# Patient Record
Sex: Female | Born: 1935
Health system: Southern US, Community
[De-identification: ages and names within clinical notes are randomized; demographics above are authoritative.]

## PROBLEM LIST (undated history)

## (undated) DIAGNOSIS — E785 Hyperlipidemia, unspecified: Secondary | ICD-10-CM

## (undated) DIAGNOSIS — I4891 Unspecified atrial fibrillation: Secondary | ICD-10-CM

## (undated) DIAGNOSIS — I429 Cardiomyopathy, unspecified: Secondary | ICD-10-CM

## (undated) DIAGNOSIS — I639 Cerebral infarction, unspecified: Secondary | ICD-10-CM

## (undated) DIAGNOSIS — C801 Malignant (primary) neoplasm, unspecified: Secondary | ICD-10-CM

## (undated) DIAGNOSIS — C50919 Malignant neoplasm of unspecified site of unspecified female breast: Secondary | ICD-10-CM

## (undated) DIAGNOSIS — I4821 Permanent atrial fibrillation: Secondary | ICD-10-CM

## (undated) DIAGNOSIS — D649 Anemia, unspecified: Secondary | ICD-10-CM

## (undated) DIAGNOSIS — I499 Cardiac arrhythmia, unspecified: Secondary | ICD-10-CM

## (undated) DIAGNOSIS — I471 Supraventricular tachycardia, unspecified: Secondary | ICD-10-CM

## (undated) DIAGNOSIS — I509 Heart failure, unspecified: Secondary | ICD-10-CM

## (undated) DIAGNOSIS — Z5189 Encounter for other specified aftercare: Secondary | ICD-10-CM

## (undated) DIAGNOSIS — I10 Essential (primary) hypertension: Secondary | ICD-10-CM

## (undated) DIAGNOSIS — H269 Unspecified cataract: Secondary | ICD-10-CM

## (undated) HISTORY — DX: Encounter for other specified aftercare: Z51.89

## (undated) HISTORY — DX: Supraventricular tachycardia: I47.1

## (undated) HISTORY — DX: Hyperlipidemia, unspecified: E78.5

## (undated) HISTORY — DX: Cardiac arrhythmia, unspecified: I49.9

## (undated) HISTORY — DX: Unspecified atrial fibrillation: I48.91

## (undated) HISTORY — DX: Unspecified cataract: H26.9

## (undated) HISTORY — DX: Supraventricular tachycardia, unspecified: I47.10

## (undated) HISTORY — PX: TONSILLECTOMY: SUR1361

## (undated) HISTORY — DX: Essential (primary) hypertension: I10

## (undated) HISTORY — DX: Anemia, unspecified: D64.9

## (undated) HISTORY — PX: WRIST SURGERY: SHX841

## (undated) HISTORY — DX: Cardiomyopathy, unspecified: I42.9

## (undated) HISTORY — DX: Heart failure, unspecified: I50.9

## (undated) HISTORY — DX: Permanent atrial fibrillation: I48.21

## (undated) HISTORY — PX: MASTECTOMY: SHX3

---

## 2016-07-25 ENCOUNTER — Encounter: Payer: Self-pay | Admitting: Cardiology

## 2016-07-26 ENCOUNTER — Ambulatory Visit: Payer: Self-pay | Admitting: Cardiology

## 2016-08-01 ENCOUNTER — Encounter: Payer: Self-pay | Admitting: Cardiology

## 2016-08-01 ENCOUNTER — Ambulatory Visit (INDEPENDENT_AMBULATORY_CARE_PROVIDER_SITE_OTHER): Payer: Medicare HMO | Admitting: Cardiology

## 2016-08-01 VITALS — BP 138/88 | HR 123 | Ht 62.0 in | Wt 138.0 lb

## 2016-08-01 DIAGNOSIS — I4821 Permanent atrial fibrillation: Secondary | ICD-10-CM

## 2016-08-01 DIAGNOSIS — Z7901 Long term (current) use of anticoagulants: Secondary | ICD-10-CM | POA: Diagnosis not present

## 2016-08-01 DIAGNOSIS — I482 Chronic atrial fibrillation: Secondary | ICD-10-CM

## 2016-08-01 DIAGNOSIS — I4891 Unspecified atrial fibrillation: Secondary | ICD-10-CM | POA: Diagnosis not present

## 2016-08-01 DIAGNOSIS — I420 Dilated cardiomyopathy: Secondary | ICD-10-CM

## 2016-08-01 MED ORDER — METOPROLOL SUCCINATE ER 100 MG PO TB24
100.0000 mg | ORAL_TABLET | Freq: Every day | ORAL | 6 refills | Status: DC
Start: 2016-08-01 — End: 2016-08-01

## 2016-08-01 MED ORDER — LOSARTAN POTASSIUM 50 MG PO TABS: 50.0000 mg | ORAL_TABLET | Freq: Every day | ORAL | 1 refills | Status: DC

## 2016-08-01 MED ORDER — DILTIAZEM HCL ER COATED BEADS 120 MG PO CP24: 120.0000 mg | ORAL_CAPSULE | Freq: Every day | ORAL | 6 refills | Status: DC

## 2016-08-01 MED ORDER — BUMETANIDE 1 MG PO TABS: 1.0000 mg | ORAL_TABLET | Freq: Every day | ORAL | 0 refills | Status: DC

## 2016-08-01 MED ORDER — APIXABAN 5 MG PO TABS: 5.0000 mg | ORAL_TABLET | Freq: Two times a day (BID) | ORAL | 1 refills | Status: DC

## 2016-08-01 MED ORDER — LOSARTAN POTASSIUM 50 MG PO TABS: 50.0000 mg | ORAL_TABLET | Freq: Every day | ORAL | 6 refills | Status: DC

## 2016-08-01 MED ORDER — DILTIAZEM HCL ER COATED BEADS 120 MG PO CP24: 120.0000 mg | ORAL_CAPSULE | Freq: Every day | ORAL | 1 refills | Status: DC

## 2016-08-01 MED ORDER — APIXABAN 5 MG PO TABS: 5.0000 mg | ORAL_TABLET | Freq: Two times a day (BID) | ORAL | 6 refills | Status: DC

## 2016-08-01 MED ORDER — METOPROLOL SUCCINATE ER 100 MG PO TB24: 100.0000 mg | ORAL_TABLET | Freq: Every day | ORAL | 1 refills | Status: DC

## 2016-08-01 NOTE — Patient Instructions (Signed)
Medication Instructions:  Start Eliquis 5mg  two times a day  Start metoprolol succinate (Toprol XL) 100 mg daily  Start losartan 50mg  daily.  Start Diltizaem CD 120mg  daily.  Use Bumex 1mg  daily as needed (this is the fluid pill)  Labwork: None   Testing/Procedures: None   Follow-Up:  Your physician recommends that you schedule a follow-up appointment in: 3 weeks with Nell Range, PA You will probably need lab and EKG at that time.        If you need a refill on your cardiac medications before your next appointment, please call your pharmacy.

## 2016-08-01 NOTE — Progress Notes (Signed)
Cardiology Office Note    Date:  08/01/2016   ID:  Carol [name], DOB [birth year], MRN [id number]  PCP:  No primary care provider on file.  Cardiologist:   Candee Furbish, MD     History of Present Illness:  Carol [name] is a 82 y.o. female with AFIB RVR here to establish care after moving from [facility].   Previously in April her ejection fraction was 40% but this improved to 50% a month later after improved rate control. She switched insurances and now has Medicare part D. Previously the Eliquis was not affordable for her at $400 a month.  She admits that she's been off of some of the medications since moving to [place]. She is now living independently in an apartment. She is here with her friend today who knows Carol [name], [place].  Had a small stroke she was told at the time treated for breast cancer. Saw neuro. 10 years ago.   No DM,   +HTN  No PVD  She denies any prior myocardial infarctions. She seems younger than stated age.  Past Medical History:  Diagnosis Date  . A-fib ([place])   . Arrhythmia   . Dyslipidemia   . Hyperlipidemia   . Hypertension   . Paroxysmal supraventricular tachycardia (HCC)     No past surgical history on file.  Current Medications: Outpatient Medications Prior to Visit  Medication Sig Dispense Refill  . acetaminophen (TYLENOL) 325 MG tablet Take 650 mg by mouth every 4 (four) hours as needed for mild pain.    Marland Kitchen apixaban (ELIQUIS) 5 MG TABS tablet Take 5 mg by mouth 2 (two) times daily.    . bumetanide (BUMEX) 1 MG tablet Take 1 mg by mouth daily.    Marland Kitchen diltiazem (CARDIZEM CD) 120 MG 24 hr capsule Take 120 mg by mouth daily.    Marland Kitchen losartan (COZAAR) 50 MG tablet Take 50 mg by mouth daily.    . metoprolol succinate (TOPROL-XL) 100 MG 24 hr tablet Take 100 mg by mouth daily. Take with or immediately following a meal EVERY 2 DAYS.     No facility-administered medications prior to visit.      Allergies:   Patient has no known  allergies.   Social History   Social History  . Marital status: Widowed    Spouse name: N/A  . Number of children: N/A  . Years of education: N/A   Social History Main Topics  . Smoking status: Never Smoker  . Smokeless tobacco: Never Used  . Alcohol use No  . Drug use: No  . Sexual activity: Not Asked   Other Topics Concern  . None   Social History Narrative  . None     Family History:  The patient's Mother died early in her life. No CAD. Had epilespy.    ROS:   Please see the history of present illness.   No bleeding, no orthopnea, no PND, no syncope. ROS All other systems reviewed and are negative.   PHYSICAL EXAM:   VS:  BP 138/88   Pulse (!) 123   Ht 5\' 2"  (1.575 m)   Wt 138 lb (62.6 kg)   LMP  (LMP Unknown)   BMI 25.24 kg/m    GEN: Well nourished, well developed, in no acute distress  HEENT: normal  Neck: no JVD, carotid bruits, or masses Cardiac: Irreg iireg;Tachycardic no murmurs, rubs, or gallops,no edema  Respiratory:  clear to auscultation bilaterally, normal work of breathing GI: soft,  nontender, nondistended, + BS MS: no deformity or atrophy  Skin: warm and dry, no rash Neuro:  Alert and Oriented x 3, Strength and sensation are intact Psych: euthymic mood, full affect  Wt Readings from Last 3 Encounters:  08/01/16 138 lb (62.6 kg)      Studies/Labs Reviewed:   EKG:  EKG is ordered today.  The ekg ordered today demonstrates 08/01/16-atrial fibrillation heart rate 122 bpm, rapid ventricular response, left axis deviation.  Recent Labs: No results found for requested labs within last 8760 hours.   Lipid Panel No results found for: CHOL, TRIG, HDL, CHOLHDL, VLDL, LDLCALC, LDLDIRECT  Additional studies/ records that were reviewed today include:  Prior office notes reviewed, lab work reviewed, EKG reviewed    ASSESSMENT:    1. Atrial fibrillation, unspecified type ([place])   2. Permanent atrial fibrillation ([place])   3. Chronic anticoagulation    4. Congestive dilated cardiomyopathy (HCC)      PLAN:  In order of problems listed above:  Permanent atrial fibrillation  - Trying to improve rate control.  - Currently on metoprolol succinate 100 mg and Cardizem CD 120 mg once a day. She may not a been taking these recently. We will get her back on these medications and check her heart rate.  Chronic anticoagulation  - Eliquis 5 mg twice a day  - Chads-Vasc score- (2-age, 1-HTN, 2-CVA, 1 CHF) - 6  - Expressed the importance to her taking this medication for stroke prevention especially in the light of her prior mini stroke approximate 10 years ago. Went over risk score is above.  Cardiomyopathy   - Ejection fraction 50% up from 40% in May 2017. Asymptomatic, rate control. Prior stress test March 2015. Possibly from tachycardia. Prior stress test was reassuring. This was performed in 10/2013.  - Decided to use Bumex on an as-needed basis, 1 mg. She has not had any problems with fluid overload.  Essential hypertension  - She was changed from lisinopril 10 mg daily to losartan 50 mg daily. May have had a mild cold with losartan perhaps.  We will check blood work at next visit. Prior creatinine 0.7. Hemoglobin 14.9, ALT 22, TSH 2.1. Prior EKGs reviewed with A. fib RVR and nonspecific ST-T wave changes.    Medication Adjustments/Labs and Tests Ordered: Current medicines are reviewed at length with the patient today.  Concerns regarding medicines are outlined above.  Medication changes, Labs and Tests ordered today are listed in the Patient Instructions below. Patient Instructions  Medication Instructions:  Start Eliquis 5mg  two times a day  Start metoprolol succinate (Toprol XL) 100 mg daily  Start losartan 50mg  daily.  Start Diltizaem CD 120mg  daily.  Use Bumex 1mg  daily as needed (this is the fluid pill)  Labwork: None   Testing/Procedures: None   Follow-Up:  Your physician recommends that you schedule a follow-up  appointment in: 3 weeks with Carol Range, PA You will probably need lab and EKG at that time.        If you need a refill on your cardiac medications before your next appointment, please call your pharmacy.      Signed, Candee Furbish, MD  08/01/2016 5:09 PM    [facility] [address] [place], [place], [place]  [address] Phone: [phone]; Fax: [phone]

## 2016-08-10 ENCOUNTER — Ambulatory Visit: Payer: Medicare HMO | Admitting: Family Medicine

## 2016-09-01 ENCOUNTER — Encounter: Payer: Self-pay | Admitting: Family Medicine

## 2016-09-01 ENCOUNTER — Telehealth: Payer: Self-pay | Admitting: Family Medicine

## 2016-09-01 DIAGNOSIS — E785 Hyperlipidemia, unspecified: Secondary | ICD-10-CM | POA: Insufficient documentation

## 2016-09-01 DIAGNOSIS — I10 Essential (primary) hypertension: Secondary | ICD-10-CM | POA: Insufficient documentation

## 2016-09-01 DIAGNOSIS — I471 Supraventricular tachycardia: Secondary | ICD-10-CM | POA: Insufficient documentation

## 2016-09-01 NOTE — Telephone Encounter (Signed)
Received records from [facility] and [facility]- will abstract and scan pertinent

## 2016-09-05 ENCOUNTER — Telehealth: Payer: Self-pay | Admitting: Cardiology

## 2016-09-05 ENCOUNTER — Ambulatory Visit: Payer: Medicare HMO | Admitting: Cardiology

## 2016-09-05 NOTE — Telephone Encounter (Signed)
New Message  Pts daughter voiced pt is suppose to have lab and not sure if it's fasting.  There's also no order in system for pt.  Please f/u with pts daughter.

## 2016-09-05 NOTE — Telephone Encounter (Signed)
Alyson Reedy, the patient's sister-in-law (DPR on file) has requested that the patient's appointment be rescheduled for an early morning appointment. She states that when the patient was seen by Dr. Marlou Porch that they were told that she was to follow up with Nell Range, PA-C in 3 weeks, and that she would need labs and an EKG. She wanted to have the appointment early in the morning so that the patient can fast for labs. Appointment was rescheduled for 09/12/16 at 9:00am with Nell Range, PA-C. She verbalized understanding and appreciated the call.

## 2016-09-07 ENCOUNTER — Ambulatory Visit: Payer: Medicare HMO | Admitting: Physician Assistant

## 2016-09-08 NOTE — Progress Notes (Addendum)
Cardiology Office Note    Date:  09/12/2016   ID:  Gagandeep Dekle, DOB December 05, 1927, MRN [id number]  PCP:  Lamar Blinks, MD  Cardiologist: Dr. Marlou Porch  CC: afib follow up   History of Present Illness:  Carol [name] is a 83 y.o. female with a history of chronic atrial fibrillation, history of tachycardia induced CM, HTN, breast cancer, TIA and HLD who presents to clinic for follow up.   Previously in 11/2015 her ejection fraction was 40% but this improved to 50% a month later after improved rate control. She switched insurances and now has Medicare part D. Previously the Eliquis was not affordable for her at $400 a month. She had a stress test 10/2013 which was low risk.   She was seen by Dr. Marlou Porch in the office on 08/01/16 for new patient evaluation since she had just moved from [place]. She was found to be in afib with RVR. She admitted to not taking all her home medications since th move to [facility]. She was restarted on home meds of Toprol Xl 100mg  daily and Cardizem 120mg  daily. Continued on Bumex PRN for s/s CHF.   Today she presents to clinic for follow up. No CP or SOB. No LE edema, orthopnea or PND. No dizziness or syncope. No blood in stool or urine. No palpitations.    Past Medical History:  Diagnosis Date  . A-fib ([place])   . Arrhythmia   . Dyslipidemia   . Hyperlipidemia   . Hypertension   . Paroxysmal supraventricular tachycardia (HCC)     No past surgical history on file.  Current Medications: Outpatient Medications Prior to Visit  Medication Sig Dispense Refill  . acetaminophen (TYLENOL) 325 MG tablet Take 650 mg by mouth every 4 (four) hours as needed for mild pain.    Marland Kitchen apixaban (ELIQUIS) 5 MG TABS tablet Take 1 tablet (5 mg total) by mouth 2 (two) times daily. 60 tablet 0  . diltiazem (CARDIZEM CD) 120 MG 24 hr capsule Take 1 capsule (120 mg total) by mouth daily. 90 capsule 1  . losartan (COZAAR) 50 MG tablet Take 1 tablet (50 mg total) by mouth daily. 90 tablet 1    . metoprolol succinate (TOPROL-XL) 100 MG 24 hr tablet Take 1 tablet (100 mg total) by mouth daily. Take with or immediately following a meal. 90 tablet 1  . bumetanide (BUMEX) 1 MG tablet Take 1 tablet (1 mg total) by mouth daily. As needed (Patient not taking: Reported on 09/12/2016) 30 tablet 0   No facility-administered medications prior to visit.      Allergies:   Patient has no known allergies.   Social History   Social History  . Marital status: Widowed    Spouse name: N/A  . Number of children: N/A  . Years of education: N/A   Social History Main Topics  . Smoking status: Never Smoker  . Smokeless tobacco: Never Used  . Alcohol use No  . Drug use: No  . Sexual activity: Not Asked   Other Topics Concern  . None   Social History Narrative  . None     Family History:  The patient's maternal aunt had a heart condition but she does not know what.    ROS:   Please see the history of present illness.    ROS All other systems reviewed and are negative.   PHYSICAL EXAM:   VS:  BP 120/68   Pulse 66   Ht 5\' 2"  (1.575 m)  Wt 142 lb 6.4 oz (64.6 kg)   LMP  (LMP Unknown)   BMI 26.05 kg/m    GEN: Well nourished, well developed, in no acute distress, appears younger than her stated age.  HEENT: normal  Neck: no JVD, carotid bruits, or masses Cardiac: irreg irreg; no murmurs, rubs, or gallops,no edema  Respiratory:  clear to auscultation bilaterally, normal work of breathing GI: soft, nontender, nondistended, + BS MS: no deformity or atrophy  Skin: warm and dry, no rash Neuro:  Alert and Oriented x 3, Strength and sensation are intact Psych: euthymic mood, full affect    Wt Readings from Last 3 Encounters:  09/12/16 142 lb 6.4 oz (64.6 kg)  08/01/16 138 lb (62.6 kg)      Studies/Labs Reviewed:   EKG:  EKG is ordered today.  The ekg ordered today demonstrates afib with CVR HR 66  Recent Labs: No results found for requested labs within last 8760 hours.    Lipid Panel No results found for: CHOL, TRIG, HDL, CHOLHDL, VLDL, LDLCALC, LDLDIRECT  Additional studies/ records that were reviewed today include:  ECHO 12/2015 EF 50%, mild LAE, mod RAE, mild AR/MR/TR, mild pulm HTN, indeterminate diastolic function (in afib) with elevated filling pressures   ASSESSMENT & PLAN:   Chronic atrial fibrillation: now HR well controlled on home medications. Continue toprol xl 100mg  daily and diltiazem 120mg  daily. Ran out of Eliquis 5mg  BID last night. (Age >80, creat 0.7, weight 64 kg). Samples given. CHADSVASC of at least 4 (HTN, age, f sex).  History of tachycardia induced CM: EF normalized by echo in 12/2015. No s/s volume overload. She has PRN bumex if she needs it.   HTN: BP well controlled today. Continue current regimen   HLD: will check lipids/LFTs today.    Medication Adjustments/Labs and Tests Ordered: Current medicines are reviewed at length with the patient today.  Concerns regarding medicines are outlined above.  Medication changes, Labs and Tests ordered today are listed in the Patient Instructions below. Patient Instructions  Medication Instructions:  Your physician recommends that you continue on your current medications as directed. Please refer to the Current Medication list given to you today.   Labwork: TODAY:  BMET, CBC, LIPID, LFT, & TSH  Testing/Procedures: None ordered  Follow-Up: Your physician wants you to follow-up in: [place] DR. Marlou Porch  You will receive a reminder letter in the mail two months in advance. If you don't receive a letter, please call our office to schedule the follow-up appointment.   Any Other Special Instructions Will Be Listed Below (If Applicable).     If you need a refill on your cardiac medications before your next appointment, please call your pharmacy.      Signed, Angelena Form, PA-C  09/12/2016 9:38 AM    [facility] [address], [place], [place]   [address] Phone: [phone]; Fax: [phone]

## 2016-09-11 ENCOUNTER — Other Ambulatory Visit: Payer: Self-pay | Admitting: *Deleted

## 2016-09-11 DIAGNOSIS — I4891 Unspecified atrial fibrillation: Secondary | ICD-10-CM

## 2016-09-11 MED ORDER — APIXABAN 5 MG PO TABS: 5.0000 mg | ORAL_TABLET | Freq: Two times a day (BID) | ORAL | 0 refills | Status: DC

## 2016-09-11 NOTE — Telephone Encounter (Signed)
Started on Eliquis 5mg  bid on Dec 5th 2017 Her age is 89 and weight 62.6kg no cbc or bmet on chart . Will need cbc and bmet with office visit [place]

## 2016-09-11 NOTE — Telephone Encounter (Signed)
Patients sister requested a short term supply until patients mail order arrives. She took her last tablet this AM.

## 2016-09-11 NOTE — Telephone Encounter (Signed)
Pt states she only has 1 tablet left of Eliquis so refill sent in to local pharmacy for 1 month until gets refill Is to see NP tomorrow and have requested CBC and BMET when seen tomorrow Pt instructed will send in 1 month supply of Eliquis and she states understanding

## 2016-09-12 ENCOUNTER — Ambulatory Visit (INDEPENDENT_AMBULATORY_CARE_PROVIDER_SITE_OTHER): Payer: Medicare HMO | Admitting: Physician Assistant

## 2016-09-12 ENCOUNTER — Encounter: Payer: Self-pay | Admitting: Physician Assistant

## 2016-09-12 VITALS — BP 120/68 | HR 66 | Ht 62.0 in | Wt 142.4 lb

## 2016-09-12 DIAGNOSIS — I482 Chronic atrial fibrillation: Secondary | ICD-10-CM | POA: Diagnosis not present

## 2016-09-12 DIAGNOSIS — E785 Hyperlipidemia, unspecified: Secondary | ICD-10-CM | POA: Diagnosis not present

## 2016-09-12 DIAGNOSIS — I10 Essential (primary) hypertension: Secondary | ICD-10-CM | POA: Diagnosis not present

## 2016-09-12 DIAGNOSIS — I4821 Permanent atrial fibrillation: Secondary | ICD-10-CM

## 2016-09-12 NOTE — Patient Instructions (Addendum)
Medication Instructions:  Your physician recommends that you continue on your current medications as directed. Please refer to the Current Medication list given to you today.   Labwork: TODAY:  BMET, CBC, LIPID, LFT, & TSH  Testing/Procedures: None ordered  Follow-Up: Your physician wants you to follow-up in: [place] DR. Marlou Porch  You will receive a reminder letter in the mail two months in advance. If you don't receive a letter, please call our office to schedule the follow-up appointment.   Any Other Special Instructions Will Be Listed Below (If Applicable).     If you need a refill on your cardiac medications before your next appointment, please call your pharmacy.

## 2016-09-13 LAB — BASIC METABOLIC PANEL
BUN / CREAT RATIO: 18 (ref 12–28)
BUN: 14 mg/dL (ref 8–27)
CALCIUM: 10 mg/dL (ref 8.7–10.3)
CHLORIDE: 101 mmol/L (ref 96–106)
CO2: 24 mmol/L (ref 18–29)
Creatinine, Ser: 0.8 mg/dL (ref 0.57–1.00)
GFR, EST AFRICAN AMERICAN: 77 mL/min/{1.73_m2} (ref >59)
GFR, EST NON AFRICAN AMERICAN: 67 mL/min/{1.73_m2} (ref >59)
Glucose: 90 mg/dL (ref 65–99)
Potassium: 4.4 mmol/L (ref 3.5–5.2)
SODIUM: 141 mmol/L (ref 134–144)

## 2016-09-13 LAB — HEPATIC FUNCTION PANEL
ALBUMIN: 4.1 g/dL (ref 3.5–4.7)
ALK PHOS: 72 IU/L (ref 39–117)
ALT: 19 IU/L (ref 0–32)
AST: 28 IU/L (ref 0–40)
BILIRUBIN TOTAL: 0.5 mg/dL (ref 0.0–1.2)
Bilirubin, Direct: 0.18 mg/dL (ref 0.00–0.40)
TOTAL PROTEIN: 6.9 g/dL (ref 6.0–8.5)

## 2016-09-13 LAB — LIPID PANEL
CHOL/HDL RATIO: 2.2 ratio (ref 0.0–4.4)
Cholesterol, Total: 182 mg/dL (ref 100–199)
HDL: 81 mg/dL (ref >39)
LDL Calculated: 87 mg/dL (ref 0–99)
Triglycerides: 72 mg/dL (ref 0–149)
VLDL Cholesterol Cal: 14 mg/dL (ref 5–40)

## 2016-09-13 LAB — CBC
HEMATOCRIT: 40.3 % (ref 34.0–46.6)
Hemoglobin: 13.6 g/dL (ref 11.1–15.9)
MCH: 30.4 pg (ref 26.6–33.0)
MCHC: 33.7 g/dL (ref 31.5–35.7)
MCV: 90 fL (ref 79–97)
PLATELETS: 241 10*3/uL (ref 150–379)
RBC: 4.48 x10E6/uL (ref 3.77–5.28)
RDW: 14.7 % (ref 12.3–15.4)
WBC: 5.6 10*3/uL (ref 3.4–10.8)

## 2016-09-13 LAB — TSH: TSH: 1.99 u[IU]/mL (ref 0.450–4.500)

## 2016-09-15 ENCOUNTER — Telehealth: Payer: Self-pay | Admitting: *Deleted

## 2016-09-15 NOTE — Telephone Encounter (Signed)
Spoke with pt and informed that her cbc and SrCr done on Jan 16th  are within normal range for her to be on Eliquis 5mg  bid and to continue on this dosage . Also informed that she has 1 month refill sent to local pharmacy and she states she will let us know when to sent refill to mail order pharmacy

## 2016-09-17 ENCOUNTER — Other Ambulatory Visit: Payer: Self-pay | Admitting: Cardiology

## 2016-09-17 DIAGNOSIS — I4891 Unspecified atrial fibrillation: Secondary | ICD-10-CM

## 2016-09-25 ENCOUNTER — Ambulatory Visit: Payer: Medicare HMO | Admitting: Family Medicine

## 2016-10-03 ENCOUNTER — Emergency Department (HOSPITAL_BASED_OUTPATIENT_CLINIC_OR_DEPARTMENT_OTHER): Payer: Medicare HMO

## 2016-10-03 ENCOUNTER — Emergency Department (HOSPITAL_BASED_OUTPATIENT_CLINIC_OR_DEPARTMENT_OTHER)
Admission: EM | Admit: 2016-10-03 | Discharge: 2016-10-03 | Disposition: A | Discharge status: Home / Self Care | Payer: Medicare HMO | Attending: Emergency Medicine | Admitting: Emergency Medicine

## 2016-10-03 ENCOUNTER — Telehealth: Payer: Self-pay | Admitting: Family Medicine

## 2016-10-03 ENCOUNTER — Encounter (HOSPITAL_BASED_OUTPATIENT_CLINIC_OR_DEPARTMENT_OTHER): Payer: Self-pay | Admitting: Emergency Medicine

## 2016-10-03 DIAGNOSIS — Z5181 Encounter for therapeutic drug level monitoring: Secondary | ICD-10-CM | POA: Insufficient documentation

## 2016-10-03 DIAGNOSIS — R479 Unspecified speech disturbances: Secondary | ICD-10-CM | POA: Diagnosis not present

## 2016-10-03 DIAGNOSIS — Z79899 Other long term (current) drug therapy: Secondary | ICD-10-CM | POA: Insufficient documentation

## 2016-10-03 DIAGNOSIS — Z853 Personal history of malignant neoplasm of breast: Secondary | ICD-10-CM | POA: Insufficient documentation

## 2016-10-03 DIAGNOSIS — I10 Essential (primary) hypertension: Secondary | ICD-10-CM | POA: Insufficient documentation

## 2016-10-03 HISTORY — DX: Cerebral infarction, unspecified: I63.9

## 2016-10-03 HISTORY — DX: Malignant (primary) neoplasm, unspecified: C80.1

## 2016-10-03 HISTORY — DX: Malignant neoplasm of unspecified site of unspecified female breast: C50.919

## 2016-10-03 LAB — COMPREHENSIVE METABOLIC PANEL
ALBUMIN: 4.4 g/dL (ref 3.5–5.0)
ALT: 29 U/L (ref 14–54)
ANION GAP: 9 (ref 5–15)
AST: 44 U/L — AB (ref 15–41)
Alkaline Phosphatase: 74 U/L (ref 38–126)
BILIRUBIN TOTAL: 0.9 mg/dL (ref 0.3–1.2)
BUN: 18 mg/dL (ref 6–20)
CHLORIDE: 99 mmol/L — AB (ref 101–111)
CO2: 27 mmol/L (ref 22–32)
Calcium: 9.7 mg/dL (ref 8.9–10.3)
Creatinine, Ser: 0.69 mg/dL (ref 0.44–1.00)
GFR calc Af Amer: >60 mL/min (ref >60)
GFR calc non Af Amer: >60 mL/min (ref >60)
GLUCOSE: 98 mg/dL (ref 65–99)
POTASSIUM: 4.6 mmol/L (ref 3.5–5.1)
SODIUM: 135 mmol/L (ref 135–145)
TOTAL PROTEIN: 8.2 g/dL — AB (ref 6.5–8.1)

## 2016-10-03 LAB — RAPID URINE DRUG SCREEN, HOSP PERFORMED
AMPHETAMINES: NOT DETECTED
BENZODIAZEPINES: NOT DETECTED
Barbiturates: NOT DETECTED
Cocaine: NOT DETECTED
Opiates: NOT DETECTED
TETRAHYDROCANNABINOL: NOT DETECTED

## 2016-10-03 LAB — DIFFERENTIAL
BASOS ABS: 0.1 10*3/uL (ref 0.0–0.1)
BASOS PCT: 1 %
EOS ABS: 0.2 10*3/uL (ref 0.0–0.7)
EOS PCT: 3 %
LYMPHS ABS: 2.4 10*3/uL (ref 0.7–4.0)
Lymphocytes Relative: 38 %
Monocytes Absolute: 1 10*3/uL (ref 0.1–1.0)
Monocytes Relative: 17 %
NEUTROS PCT: 41 %
Neutro Abs: 2.6 10*3/uL (ref 1.7–7.7)

## 2016-10-03 LAB — URINALYSIS, MICROSCOPIC (REFLEX)

## 2016-10-03 LAB — URINALYSIS, ROUTINE W REFLEX MICROSCOPIC
BILIRUBIN URINE: NEGATIVE
Glucose, UA: NEGATIVE mg/dL
KETONES UR: NEGATIVE mg/dL
NITRITE: NEGATIVE
Protein, ur: NEGATIVE mg/dL
SPECIFIC GRAVITY, URINE: 1.006 (ref 1.005–1.030)
pH: 5.5 (ref 5.0–8.0)

## 2016-10-03 LAB — CBC
HCT: 43.2 % (ref 36.0–46.0)
HEMOGLOBIN: 14.6 g/dL (ref 12.0–15.0)
MCH: 30.2 pg (ref 26.0–34.0)
MCHC: 33.8 g/dL (ref 30.0–36.0)
MCV: 89.3 fL (ref 78.0–100.0)
PLATELETS: 226 10*3/uL (ref 150–400)
RBC: 4.84 MIL/uL (ref 3.87–5.11)
RDW: 14.5 % (ref 11.5–15.5)
WBC: 6.3 10*3/uL (ref 4.0–10.5)

## 2016-10-03 LAB — APTT: aPTT: 28 seconds (ref 24–36)

## 2016-10-03 LAB — TROPONIN I

## 2016-10-03 LAB — ETHANOL

## 2016-10-03 LAB — PROTIME-INR
INR: 1.11
Prothrombin Time: 14.3 seconds (ref 11.4–15.2)

## 2016-10-03 NOTE — Telephone Encounter (Signed)
Patient Name: Carol [name] DOB: 01-Feb-1928 Initial Comment Caller states she is having trouble forming her words this last week. She is afraid she may have had a mini stroke. Nurse Assessment Nurse: Ronnald Ramp, RN, Miranda Date/Time (Eastern Time): 10/03/2016 2:14:20 PM Confirm and document reason for call. If symptomatic, describe symptoms. ---Caller states she has been having trouble with her speech for the last week. No other symptoms. Does the patient have any new or worsening symptoms? ---Yes Will a triage be completed? ---Yes Related visit to physician within the last 2 weeks? ---No Does the PT have any chronic conditions? (i.e. diabetes, asthma, etc.) ---Yes List chronic conditions. ---hx of stroke, HTN, A-fib Is this a behavioral health or substance abuse call? ---No Guidelines Guideline Title Affirmed Question Affirmed Notes Neurologic Deficit [1] Loss of speech or garbled speech AND [2] gradual onset (e.g., days to weeks) AND [3] present now Final Disposition User See Physician within 4 Hours (or PCP triage) Ronnald Ramp, RN, Miranda Comments Caller has an appt for tomorrow at 3pm with Dr. Lorelei Pont. Told caller to keep that appt but also suggested she contact her insurance company to find out an urgent care that she go to today to be seen. Referrals GO TO FACILITY UNDECIDED Disagree/Comply: Comply

## 2016-10-03 NOTE — ED Provider Notes (Signed)
[facility] DEPT [facility] Provider Note   CSN: [id number] Arrival date & time: 10/03/16  1612     History   Chief Complaint Chief Complaint  Patient presents with  . Aphasia    HPI Carol [name] is a 83 y.o. female hx of afib on eliquis, HL, HTN, previous stroke 8 years ago here with some trouble with her speech. Patient states that over the last 5 days, she has some trouble getting words out and pronounce it correctly. She has trouble coming up with words but after a second or so she was able to come up with the words. Denies any weakness or numbness. She has a primary care appointment tomorrow but was told to come the ER today for stroke evaluation. She states that she moved here from [place] about 2 months ago and she's been taking her Eliquis.   The history is provided by the patient.    Past Medical History:  Diagnosis Date  . A-fib ([place])   . Arrhythmia   . Breast cancer ([place])   . Cancer ([place])   . Dyslipidemia   . Hyperlipidemia   . Hypertension   . Paroxysmal supraventricular tachycardia ([place])   . Stroke [facility])     Patient Active Problem List   Diagnosis Date Noted  . Essential hypertension, benign 09/01/2016  . Paroxysmal supraventricular tachycardia ([place]) 09/01/2016  . Hyperlipidemia 09/01/2016    Past Surgical History:  Procedure Laterality Date  . MASTECTOMY    . TONSILLECTOMY    . WRIST SURGERY      OB History    No data available       Home Medications    Prior to Admission medications   Medication Sig Start Date End Date Taking? Authorizing Provider  acetaminophen (TYLENOL) 325 MG tablet Take 650 mg by mouth every 4 (four) hours as needed for mild pain.    Historical Provider, MD  bumetanide (BUMEX) 1 MG tablet Take 1 mg by mouth daily as needed (swelling).    Historical Provider, MD  diltiazem (CARDIZEM CD) 120 MG 24 hr capsule Take 1 capsule (120 mg total) by mouth daily. 08/01/16 10/30/16  Jerline Pain, MD  ELIQUIS 5 MG TABS tablet TAKE 1  TABLET(5 MG) BY MOUTH TWICE DAILY 09/18/16   Jerline Pain, MD  losartan (COZAAR) 50 MG tablet Take 1 tablet (50 mg total) by mouth daily. 08/01/16   Jerline Pain, MD  metoprolol succinate (TOPROL-XL) 100 MG 24 hr tablet Take 1 tablet (100 mg total) by mouth daily. Take with or immediately following a meal. 08/01/16 10/30/16  Jerline Pain, MD    Family History Family History  Problem Relation Age of Onset  . Heart disease Maternal Aunt     Social History Social History  Substance Use Topics  . Smoking status: Never Smoker  . Smokeless tobacco: Never Used  . Alcohol use No     Allergies   Zoloft [sertraline hcl]   Review of Systems Review of Systems  Neurological: Positive for speech difficulty.  All other systems reviewed and are negative.    Physical Exam Updated Vital Signs BP 148/63   Pulse 61   Temp 98 F (36.7 C) (Oral)   Resp 21   Ht 5\' 2"  (1.575 m)   Wt 142 lb (64.4 kg)   LMP  (LMP Unknown)   SpO2 99%   BMI 25.97 kg/m   Physical Exam  Constitutional: She is oriented to person, place, and time. She appears well-developed  and well-nourished.  occassional word finding difficulty, no obvious slurred speech   HENT:  Head: Normocephalic.  Eyes: Pupils are equal, round, and reactive to light.  Neck: Normal range of motion.  Cardiovascular: Normal rate, regular rhythm and normal heart sounds.   Pulmonary/Chest: Effort normal and breath sounds normal. No respiratory distress. She has no wheezes.  Abdominal: Soft. Bowel sounds are normal. She exhibits no distension. There is no tenderness.  Musculoskeletal: Normal range of motion.  Neurological: She is alert and oriented to person, place, and time. No cranial nerve deficit. Coordination normal.  CN 2-12 intact. No facial droop. Nl strength throughout. Nl finger to nose   Skin: Skin is warm.  Psychiatric: She has a normal mood and affect.  Nursing note and vitals reviewed.    ED Treatments / Results   Labs (all labs ordered are listed, but only abnormal results are displayed) Labs Reviewed  COMPREHENSIVE METABOLIC PANEL - Abnormal; Notable for the following:       Result Value   Chloride 99 (*)    Total Protein 8.2 (*)    AST 44 (*)    All other components within normal limits  URINALYSIS, ROUTINE W REFLEX MICROSCOPIC - Abnormal; Notable for the following:    Hgb urine dipstick SMALL (*)    Leukocytes, UA SMALL (*)    All other components within normal limits  URINALYSIS, MICROSCOPIC (REFLEX) - Abnormal; Notable for the following:    Bacteria, UA RARE (*)    Squamous Epithelial / LPF 0-5 (*)    All other components within normal limits  ETHANOL  CBC  DIFFERENTIAL  RAPID URINE DRUG SCREEN, HOSP PERFORMED  TROPONIN I  APTT  PROTIME-INR    EKG  EKG Interpretation  Date/Time:  Tuesday October 03 2016 17:33:00 EST Ventricular Rate:  60 PR Interval:    QRS Duration: 88 QT Interval:  478 QTC Calculation: 478 R Axis:   26 Text Interpretation:  Sinus rhythm No previous ECGs available Confirmed by Launa Goedken  MD, Sobia Karger ([id number]) on 10/03/2016 5:50:10 PM       Radiology Ct Head Wo Contrast  Result Date: 10/03/2016 CLINICAL DATA:  Slurred speech, expressive aphasia, history stroke, atrial fibrillation, paroxysmal supra ventricular tachycardia, hypertension, breast cancer EXAM: CT HEAD WITHOUT CONTRAST TECHNIQUE: Contiguous axial images were obtained from the base of the skull through the vertex without intravenous contrast. Sagittal and coronal MPR images reconstructed from axial data set. COMPARISON:  None FINDINGS: Brain: Generalized atrophy. Normal ventricular morphology. No midline shift or mass effect. Small vessel chronic ischemic changes of deep cerebral white matter. No intracranial hemorrhage, mass lesion, evidence of acute infarction, or extra-axial fluid collection. Vascular: Significant atherosclerotic calcifications of internal carotid and vertebral arteries at skullbase.  Skull: Intact Sinuses/Orbits: Clear Other: N/A IMPRESSION: Atrophy with small vessel chronic ischemic changes of deep cerebral white matter. No acute intracranial abnormalities. Electronically Signed   By: Lavonia Dana M.D.   On: 10/03/2016 17:54    Procedures Procedures (including critical care time)  Medications Ordered in ED Medications - No data to display   Initial Impression / Assessment and Plan / ED Course  I have reviewed the triage vital signs and the nursing notes.  Pertinent labs & imaging results that were available during my care of the patient were reviewed by me and considered in my medical decision making (see chart for details).     Gloriajean Ruehle is a 83 y.o. female here with word finding difficulties for the last 5  days. Outside window for TPA and has minimal deficits. No obvious slurred speech or weakness or exam. Will get labs, CT head.   7:52 PM CT head showed chronic changes. Labs unremarkable. She is already on eliquis and symptoms for 5 days. Talked to Dr. Cheral Marker from neurology, who recommend continue eliquis, neuro follow up, MRI brain outpatient within a week. Has PCP follow up tomorrow so should be able to coordinate that outpatient.    Final Clinical Impressions(s) / ED Diagnoses   Final diagnoses:  None    New Prescriptions New Prescriptions   No medications on file     Drenda Freeze, MD 10/03/16 [phone]

## 2016-10-03 NOTE — Telephone Encounter (Signed)
Called patient. Asked if she still had the same symptoms of problems forming her words. Patient states she does have the same symptoms but has not developed any new [employer] where she resides. States she is not familiar with this area but would try and make her way here. Advised she was not far away.  Advised to call 911 if she felt it was better for her to have transportation. states she has no one else to bring her.  Called facility to see if anyone could check on patient. Receptionist states she just talked to patient. Advised I tried to give patient directions to this ED. Asked if there was anyone there that could transport patient. Was told there wasn't. Receptionist states she will go and try to catch patient to give her directions to ED here at [facility].

## 2016-10-03 NOTE — Telephone Encounter (Signed)
Pt called in to schedule an appt. Pt says that she has been having trouble forming her words. Scheduled pt to come in to see provider tomorrow 10/04/16 at 3. (next available) ALSO, transferred call to team health for triaging.

## 2016-10-03 NOTE — ED Triage Notes (Signed)
Pt having episodes of what she calls "slurred speech".  Sts she can't think of a word or pronounce it correctly. Sts she can feel it coming on.  Sts she called pmd this morning and has appt tomorrow but they wanted her to come to the Ed today.  No sx at this time.  Pt drove self to Ed.

## 2016-10-03 NOTE — Discharge Instructions (Signed)
Continue taking eliquis.   See your doctor tomorrow. You should get MRI brain within next week for further evaluation   Call neuro for follow up   Return to ER if you have worse trouble with your speech, slurred speech, weakness, numbness

## 2016-10-04 ENCOUNTER — Ambulatory Visit (INDEPENDENT_AMBULATORY_CARE_PROVIDER_SITE_OTHER): Payer: Medicare HMO | Admitting: Family Medicine

## 2016-10-04 ENCOUNTER — Encounter: Payer: Self-pay | Admitting: Family Medicine

## 2016-10-04 VITALS — BP 127/71 | HR 56 | Temp 98.7°F | Ht 62.0 in | Wt 143.2 lb

## 2016-10-04 DIAGNOSIS — Z8679 Personal history of other diseases of the circulatory system: Secondary | ICD-10-CM | POA: Insufficient documentation

## 2016-10-04 DIAGNOSIS — Z7901 Long term (current) use of anticoagulants: Secondary | ICD-10-CM | POA: Insufficient documentation

## 2016-10-04 DIAGNOSIS — H9191 Unspecified hearing loss, right ear: Secondary | ICD-10-CM | POA: Diagnosis not present

## 2016-10-04 DIAGNOSIS — Z8673 Personal history of transient ischemic attack (TIA), and cerebral infarction without residual deficits: Secondary | ICD-10-CM

## 2016-10-04 DIAGNOSIS — R4789 Other speech disturbances: Secondary | ICD-10-CM

## 2016-10-04 DIAGNOSIS — H6121 Impacted cerumen, right ear: Secondary | ICD-10-CM | POA: Diagnosis not present

## 2016-10-04 NOTE — Progress Notes (Signed)
[facility]iv[facility] at [facility] [address], [place], [place] [address] [phone]  Date:  10/04/2016   Name:  Carol [name]   DOB:  1928/04/13   MRN:  [id number]  PCP:  Lamar Blinks, MD    Chief Complaint: Establish Care   History of Present Illness:  Jacqualin Shirkey is a 83 y.o. very pleasant female patient who presents with the following:  Here today as a new patient to me for ER follow-up History of a fib, hyperlipidemia, HTN, stoke in approx 2010.  She was in the ER yesterday with complaint of speech disturbance- for several days prior to her ER visit she had noted a feeling of having trouble forming words with her mouth and her speech being "garbled."  She denies any difficulty thinking of the needed word.  She has not noted any other neurological change.  in the ER she had a CT that was unremarkable. Reviewed ED notes- they planned for neurology follow-up and MRI within a week. She also had an EKG that showed SR    They actually have an appt with Dr. Krista Blue for next week. Will ask her if she wants me to order an MRI or defer to their appt   She also has noted difficulty hearing out of her right ear- she is not sure if she might have ear wax or if her hearing is just decreasing  She has a history of atrial fib and has already established care with cardiology- Dr. Marlou Porch.  Reviewed his note from December of last year. Her most recent EF was 50%  She is on cardizem and metoprolol for rate control, losartan and eliquis, bumex prn Admission on 10/03/2016, Discharged on 10/03/2016  Component Date Value Ref Range Status  . Alcohol, Ethyl (B) 10/03/2016 <5  <5 mg/dL Final   Comment:        LOWEST DETECTABLE LIMIT FOR SERUM ALCOHOL IS 5 mg/dL FOR MEDICAL PURPOSES ONLY   . WBC 10/03/2016 6.3  4.0 - 10.5 K/uL Final  . RBC 10/03/2016 4.84  3.87 - 5.11 MIL/uL Final  . Hemoglobin 10/03/2016 14.6  12.0 - 15.0 g/dL Final  . HCT 10/03/2016 43.2   36.0 - 46.0 % Final  . MCV 10/03/2016 89.3  78.0 - 100.0 fL Final  . MCH 10/03/2016 30.2  26.0 - 34.0 pg Final  . MCHC 10/03/2016 33.8  30.0 - 36.0 g/dL Final  . RDW 10/03/2016 14.5  11.5 - 15.5 % Final  . Platelets 10/03/2016 226  150 - 400 K/uL Final  . Neutrophils Relative % 10/03/2016 41  % Final  . Neutro Abs 10/03/2016 2.6  1.7 - 7.7 K/uL Final  . Lymphocytes Relative 10/03/2016 38  % Final  . Lymphs Abs 10/03/2016 2.4  0.7 - 4.0 K/uL Final  . Monocytes Relative 10/03/2016 17  % Final  . Monocytes Absolute 10/03/2016 1.0  0.1 - 1.0 K/uL Final  . Eosinophils Relative 10/03/2016 3  % Final  . Eosinophils Absolute 10/03/2016 0.2  0.0 - 0.7 K/uL Final  . Basophils Relative 10/03/2016 1  % Final  . Basophils Absolute 10/03/2016 0.1  0.0 - 0.1 K/uL Final  . Sodium 10/03/2016 135  135 - 145 mmol/L Final  . Potassium 10/03/2016 4.6  3.5 - 5.1 mmol/L Final  . Chloride 10/03/2016 99* 101 - 111 mmol/L Final  . CO2 10/03/2016 27  22 - 32 mmol/L Final  . Glucose, Bld 10/03/2016 98  65 -  99 mg/dL Final  . BUN 10/03/2016 18  6 - 20 mg/dL Final  . Creatinine, Ser 10/03/2016 0.69  0.44 - 1.00 mg/dL Final  . Calcium 10/03/2016 9.7  8.9 - 10.3 mg/dL Final  . Total Protein 10/03/2016 8.2* 6.5 - 8.1 g/dL Final  . Albumin 10/03/2016 4.4  3.5 - 5.0 g/dL Final  . AST 10/03/2016 44* 15 - 41 U/L Final  . ALT 10/03/2016 29  14 - 54 U/L Final  . Alkaline Phosphatase 10/03/2016 74  38 - 126 U/L Final  . Total Bilirubin 10/03/2016 0.9  0.3 - 1.2 mg/dL Final  . GFR calc non Af Amer 10/03/2016 >60  >60 mL/min Final  . GFR calc Af Amer 10/03/2016 >60  >60 mL/min Final   Comment: (NOTE) The eGFR has been calculated using the CKD EPI equation. This calculation has not been validated in all clinical situations. eGFR's persistently <60 mL/min signify possible Chronic Kidney Disease.   . Anion gap 10/03/2016 9  5 - 15 Final  . Opiates 10/03/2016 NONE DETECTED  NONE DETECTED Final  . Cocaine 10/03/2016 NONE  DETECTED  NONE DETECTED Final  . Benzodiazepines 10/03/2016 NONE DETECTED  NONE DETECTED Final  . Amphetamines 10/03/2016 NONE DETECTED  NONE DETECTED Final  . Tetrahydrocannabinol 10/03/2016 NONE DETECTED  NONE DETECTED Final  . Barbiturates 10/03/2016 NONE DETECTED  NONE DETECTED Final   Comment:        DRUG SCREEN FOR MEDICAL PURPOSES ONLY.  IF CONFIRMATION IS NEEDED FOR ANY PURPOSE, NOTIFY LAB WITHIN 5 DAYS.        LOWEST DETECTABLE LIMITS FOR URINE DRUG SCREEN Drug Class       Cutoff (ng/mL) Amphetamine      1000 Barbiturate      200 Benzodiazepine   945 Tricyclics       038 Opiates          300 Cocaine          300 THC              50   . Color, Urine 10/03/2016 YELLOW  YELLOW Final  . APPearance 10/03/2016 CLEAR  CLEAR Final  . Specific Gravity, Urine 10/03/2016 1.006  1.005 - 1.030 Final  . pH 10/03/2016 5.5  5.0 - 8.0 Final  . Glucose, UA 10/03/2016 NEGATIVE  NEGATIVE mg/dL Final  . Hgb urine dipstick 10/03/2016 SMALL* NEGATIVE Final  . Bilirubin Urine 10/03/2016 NEGATIVE  NEGATIVE Final  . Ketones, ur 10/03/2016 NEGATIVE  NEGATIVE mg/dL Final  . Protein, ur 10/03/2016 NEGATIVE  NEGATIVE mg/dL Final  . Nitrite 10/03/2016 NEGATIVE  NEGATIVE Final  . Leukocytes, UA 10/03/2016 SMALL* NEGATIVE Final  . Troponin I 10/03/2016 <0.03  <0.03 ng/mL Final  . aPTT 10/03/2016 28  24 - 36 seconds Final  . Prothrombin Time 10/03/2016 14.3  11.4 - 15.2 seconds Final  . INR 10/03/2016 1.11   Final  . RBC / HPF 10/03/2016 0-5  0 - 5 RBC/hpf Final  . WBC, UA 10/03/2016 0-5  0 - 5 WBC/hpf Final  . Bacteria, UA 10/03/2016 RARE* NONE SEEN Final  . Squamous Epithelial / LPF 10/03/2016 0-5* NONE SEEN Final  . Mucous 10/03/2016 PRESENT   Final   Reports that she had her flu shot last fall She moved here from [place] about a month ago- she is here today with her SIL [name], and her brother lives here too.   She has had an MRI of her head in the past but we do not  yet have the images  available   Patient Active Problem List   Diagnosis Date Noted  . Essential hypertension, benign 09/01/2016  . Paroxysmal supraventricular tachycardia ([place]) 09/01/2016  . Hyperlipidemia 09/01/2016    Past Medical History:  Diagnosis Date  . A-fib ([place])   . Arrhythmia   . Breast cancer ([place])   . Cancer ([place])   . Dyslipidemia   . Hyperlipidemia   . Hypertension   . Paroxysmal supraventricular tachycardia ([place])   . Stroke [facility])     Past Surgical History:  Procedure Laterality Date  . MASTECTOMY    . TONSILLECTOMY    . WRIST SURGERY      Social History  Substance Use Topics  . Smoking status: Never Smoker  . Smokeless tobacco: Never Used  . Alcohol use No    Family History  Problem Relation Age of Onset  . Heart disease Maternal Aunt     Allergies  Allergen Reactions  . Zoloft [Sertraline Hcl]     headache    Medication list has been reviewed and updated.  Current Outpatient Prescriptions on File Prior to Visit  Medication Sig Dispense Refill  . acetaminophen (TYLENOL) 325 MG tablet Take 650 mg by mouth every 4 (four) hours as needed for mild pain.    . bumetanide (BUMEX) 1 MG tablet Take 1 mg by mouth daily as needed (swelling).    Marland Kitchen diltiazem (CARDIZEM CD) 120 MG 24 hr capsule Take 1 capsule (120 mg total) by mouth daily. 90 capsule 1  . ELIQUIS 5 MG TABS tablet TAKE 1 TABLET(5 MG) BY MOUTH TWICE DAILY 60 tablet 5  . losartan (COZAAR) 50 MG tablet Take 1 tablet (50 mg total) by mouth daily. 90 tablet 1  . metoprolol succinate (TOPROL-XL) 100 MG 24 hr tablet Take 1 tablet (100 mg total) by mouth daily. Take with or immediately following a meal. 90 tablet 1   No current facility-administered medications on file prior to visit.     Review of Systems:  As per HPI- otherwise negative. No CP or SOB No nausea, vomiting or diarrhea No fever or chills No rash  Physical Examination: Vitals:   10/04/16 1508  BP: 127/71  Pulse: (!) 56  Temp: 98.7 F (37.1  C)   Vitals:   10/04/16 1508  Weight: 143 lb 3.2 oz (65 kg)  Height: 5' 2"  (1.575 m)   Body mass index is 26.19 kg/m. Ideal Body Weight: Weight in (lb) to have BMI = 25: 136.4  GEN: WDWN, NAD, Non-toxic, A & O x 3, well appearing older lady HEENT: Atraumatic, Normocephalic. Neck supple. No masses, No LAD.  Right TM is obscured with wax Ears and Nose: No external deformity. CV: RRR- seems to be in sinus, slightly bradycardic, No M/G/R. No JVD. No thrill. No extra heart sounds. PULM: CTA B, no wheezes, crackles, rhonchi. No retractions. No resp. distress. No accessory muscle use. ABD: S, NT, ND, +BS. No rebound. No HSM. EXTR: No c/c/e NEURO Normal gait. Normal movement of all limbs,  No facial droop, no apparent speech change PSYCH: Normally interactive. Conversant. Not depressed or anxious appearing.  Calm demeanor.   Irrigated right ear until wax removed- pt did notice an improvement in her hearing  Assessment and Plan: Episode of change in speech  History of stroke  Impacted cerumen of right ear  Decreased hearing of right ear  Chronic anticoagulation  History of atrial fibrillation  Here today to follow-up from an ER visit She has  a neurology appt next week and has no apparent speech change or other acute neurological finding today.  Message to dr. Krista Blue re: her MRI Continue eliquis Continue medications for rate and BP control Irrigated right ear to remove wax, pt felt better Plan recheck here in about 4 months    Signed Lamar Blinks, MD

## 2016-10-04 NOTE — Patient Instructions (Signed)
It was a pleasure to see you today- I hope that your will be able to hear more easily now!  I will send a message to Dr. Krista Blue about your MRI and see if she wants me to order this now or have it done at their office  Please let me know if you have any change in your symptoms or any other concerns!    Otherwise let's plan to visit again in 4-6 months

## 2016-10-04 NOTE — Progress Notes (Signed)
Pre visit review using our clinic tool,if applicable. No additional management support is needed unless otherwise documented below in the visit note.  

## 2016-10-10 ENCOUNTER — Ambulatory Visit (INDEPENDENT_AMBULATORY_CARE_PROVIDER_SITE_OTHER): Payer: Medicare HMO | Admitting: Neurology

## 2016-10-10 ENCOUNTER — Encounter: Payer: Self-pay | Admitting: Neurology

## 2016-10-10 VITALS — BP 122/68 | HR 56 | Ht 62.0 in | Wt 139.2 lb

## 2016-10-10 DIAGNOSIS — F09 Unspecified mental disorder due to known physiological condition: Secondary | ICD-10-CM | POA: Diagnosis not present

## 2016-10-10 DIAGNOSIS — I471 Supraventricular tachycardia: Secondary | ICD-10-CM | POA: Diagnosis not present

## 2016-10-10 DIAGNOSIS — Z7901 Long term (current) use of anticoagulants: Secondary | ICD-10-CM

## 2016-10-10 NOTE — Progress Notes (Signed)
PATIENT: Carol [name] DOB: Apr 02, 1928  Chief Complaint  Patient presents with  . Aphasia    She is here with her sister-in-law, [name]. She was seen in the ED for speech disturbance on 10/07/16.  States this had been a problem at least a week prior to seeking treatment.  She initially felt it was stress related to her recent move here from [place].  She was concerned due to her A-fib and prior history of stroke. She was evaluated in the ED on 10/07/16.  She has been taking Eliquis 5mg  daily for several months.  Marland Kitchen PCP    Darreld Mclean, MD (referred from ED)     Carol [name] is 83 year old right-handed female, accompanied by her sister-in-law [name], seen in refer by her primary care doctor [place] for evaluation of intermittent speech disturbance. Initial evaluation was October 10 2016.  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  She reported maternal uncle and aunt suffered mild memory loss in their eighties, she now moved in to her retirement facility independent living, to be close to her brother.  She was noted to have mild word finding difficulties recently, struggling for word, tends to misplace things, she is otherwise ambulate without much difficulty, sleeps well has good appetite no bowel and bladder issues.   I revealed laboratory evaluation, normal INR 1.1, negative troponin, UDS, CMP, CBC, TSH, LDL 87,  REVIEW OF SYSTEMS: Full 14 system review of systems performed and notable only for as above  ALLERGIES: Allergies  Allergen  Reactions  . Zoloft [Sertraline Hcl]     headache    HOME MEDICATIONS: Current Outpatient Prescriptions  Medication Sig Dispense Refill  . acetaminophen (TYLENOL) 325 MG tablet Take 650 mg by mouth every 4 (four) hours as needed for mild pain.    . bumetanide (BUMEX) 1 MG tablet Take 1 mg by mouth daily as needed (swelling).    Marland Kitchen diltiazem (CARDIZEM CD) 120 MG 24 hr capsule Take 1 capsule (120 mg total) by mouth daily. 90 capsule 1  . ELIQUIS 5 MG TABS tablet TAKE 1 TABLET(5 MG) BY MOUTH TWICE DAILY 60 tablet 5  . losartan (COZAAR) 50 MG tablet Take 1 tablet (50 mg total) by mouth daily. 90 tablet 1  . metoprolol succinate (TOPROL-XL) 100 MG 24 hr tablet Take 1 tablet (100 mg total) by mouth daily. Take with or immediately following a meal. 90 tablet 1   No current facility-administered medications for this visit.     PAST MEDICAL HISTORY: Past Medical History:  Diagnosis Date  . A-fib ([place])   . Anemia   . Arrhythmia   . Blood transfusion without reported diagnosis   . Breast cancer ([place])   . Cancer ([place])   . Cataract   . CHF (congestive heart failure) ([place])   . Dyslipidemia   . Hyperlipidemia   . Hypertension   . Paroxysmal supraventricular tachycardia ([place])   . Stroke [facility])     PAST SURGICAL HISTORY: Past Surgical History:  Procedure Laterality Date  . MASTECTOMY    . TONSILLECTOMY    . WRIST SURGERY  FAMILY HISTORY: Family History  Problem Relation Age of Onset  . Epilepsy Mother   . Stroke Father   . Heart disease Maternal Aunt     SOCIAL HISTORY:  Social History   Social History  . Marital status: Widowed    Spouse name: N/A  . Number of children: 2  . Years of education: 2 years college   Occupational History  . Retired    Social History Main Topics  . Smoking status: Never Smoker  . Smokeless tobacco: Never Used  . Alcohol use No  . Drug use: No  . Sexual activity: Not on file   Other Topics Concern  . Not on file   Social History  Narrative   Lives in a retirement community.   Right-handed.   Only drinks caffeine some days.     PHYSICAL EXAM   Vitals:   10/10/16 1008  BP: 122/68  Pulse: (!) 56  Weight: 139 lb 4 oz (63.2 kg)  Height: 5\' 2"  (1.575 m)    Not recorded      Body mass index is 25.47 kg/m.  PHYSICAL EXAMNIATION:  Gen: NAD, conversant, well nourised, obese, well groomed                     Cardiovascular: Regular rate rhythm, no peripheral edema, warm, nontender. Eyes: Conjunctivae clear without exudates or hemorrhage Neck: Supple, no carotid bruits. Pulmonary: Clear to auscultation bilaterally   NEUROLOGICAL EXAM:  MENTAL STATUS: Speech:    Speech is normal; fluent and spontaneous with normal comprehension.  Cognition:Mini-Mental Status Examination 29/30,     Orientation to time, place and person     Normal recent and remote memory      Attention span and concentration: Made one mistake on series 7     Normal Language, naming, repeating,spontaneous speech     Fund of knowledge   CRANIAL NERVES: CN II: Visual fields are full to confrontation. Fundoscopic exam is normal with sharp discs and no vascular changes. Pupils are round equal and briskly reactive to light. CN III, IV, VI: extraocular movement are normal. No ptosis. CN V: Facial sensation is intact to pinprick in all 3 divisions bilaterally. Corneal responses are intact.  CN VII: Face is symmetric with normal eye closure and smile. CN VIII: Hearing is normal to rubbing fingers CN IX, X: Palate elevates symmetrically. Phonation is normal. CN XI: Head turning and shoulder shrug are intact CN XII: Tongue is midline with normal movements and no atrophy.  MOTOR: There is no pronator drift of out-stretched arms. Muscle bulk and tone are normal. Muscle strength is normal.  REFLEXES: Reflexes are 2+ and symmetric at the biceps, triceps, knees, and ankles. Plantar responses are flexor.  SENSORY: Intact to light touch, pinprick,  positional sensation and vibratory sensation are intact in fingers and toes.  COORDINATION: Rapid alternating movements and fine finger movements are intact. There is no dysmetria on finger-to-nose and heel-knee-shin.    GAIT/STANCE: Posture is normal. Gait is steady with normal steps, base, arm swing, and turning. Heel and toe walking are normal. Tandem gait is normal.  Romberg is absent.   DIAGNOSTIC DATA (LABS, IMAGING, TESTING) - I reviewed patient records, labs, notes, testing and imaging myself where available.   ASSESSMENT AND PLAN  Judythe Pebley is a 83 y.o. female   Mild cognitive impairment  MRI of the brain without contrast  Laboratory evaluations  Return to clinic in 3 months   Marcial Pacas, M.D. Ph.D.  [facility] [address] [place], [place] [address] Ph: [phone] Fax: [phone]  CC: Darreld Mclean, MD

## 2016-10-11 LAB — VITAMIN B12: Vitamin B-12: 364 pg/mL (ref 232–1245)

## 2016-10-14 ENCOUNTER — Ambulatory Visit (HOSPITAL_BASED_OUTPATIENT_CLINIC_OR_DEPARTMENT_OTHER)
Admission: RE | Admit: 2016-10-14 | Discharge: 2016-10-14 | Disposition: A | Discharge status: Home / Self Care | Payer: Medicare HMO | Source: Ambulatory Visit | Attending: Neurology | Admitting: Neurology

## 2016-10-14 DIAGNOSIS — I471 Supraventricular tachycardia: Secondary | ICD-10-CM

## 2016-10-14 DIAGNOSIS — Z7901 Long term (current) use of anticoagulants: Secondary | ICD-10-CM

## 2016-10-14 DIAGNOSIS — G319 Degenerative disease of nervous system, unspecified: Secondary | ICD-10-CM | POA: Diagnosis not present

## 2016-10-14 DIAGNOSIS — F09 Unspecified mental disorder due to known physiological condition: Secondary | ICD-10-CM | POA: Insufficient documentation

## 2016-10-16 ENCOUNTER — Telehealth: Payer: Self-pay | Admitting: *Deleted

## 2016-10-16 NOTE — Telephone Encounter (Signed)
Left message for a return call

## 2016-10-16 NOTE — Telephone Encounter (Signed)
Patient is returning your call.  

## 2016-10-16 NOTE — Telephone Encounter (Signed)
-----   Message from Marcial Pacas, MD sent at 10/16/2016 12:29 PM EST ----- Please call patient: MRI of the brain showed age-related generalized atrophy, no acute abnormality

## 2016-10-16 NOTE — Telephone Encounter (Signed)
Spoke to patient she is aware of results

## 2016-10-26 ENCOUNTER — Ambulatory Visit: Payer: Medicare HMO | Admitting: Family Medicine

## 2016-11-02 ENCOUNTER — Telehealth: Payer: Self-pay | Admitting: Neurology

## 2016-11-02 ENCOUNTER — Ambulatory Visit (INDEPENDENT_AMBULATORY_CARE_PROVIDER_SITE_OTHER): Payer: Medicare HMO | Admitting: Family Medicine

## 2016-11-02 VITALS — BP 126/62 | HR 57 | Temp 97.5°F | Ht 62.0 in | Wt 143.0 lb

## 2016-11-02 DIAGNOSIS — Z23 Encounter for immunization: Secondary | ICD-10-CM | POA: Diagnosis not present

## 2016-11-02 DIAGNOSIS — Z8739 Personal history of other diseases of the musculoskeletal system and connective tissue: Secondary | ICD-10-CM

## 2016-11-02 DIAGNOSIS — E2839 Other primary ovarian failure: Secondary | ICD-10-CM

## 2016-11-02 DIAGNOSIS — M25562 Pain in left knee: Secondary | ICD-10-CM | POA: Diagnosis not present

## 2016-11-02 NOTE — Patient Instructions (Signed)
It was very nice to see you today!  You got your pneumonia booster shot today- you do not need to have any further pneumonia vaccines  I think you are ok to get back to your walking program.  However if this makes your knee worse, or if you have any redness or swelling please let me know.  Ice would be helpful for any pain in your knee, and you might try a knee sleeve (you can but at the drug store) to support the joint.  Please call me if the knee pain does not go away!   Otherwise let's plan to visit in 6 months

## 2016-11-02 NOTE — Telephone Encounter (Signed)
Spoke to [employer] (sister-in-law on HIPAA/pt does not have dgt) - she is aware of test results.

## 2016-11-02 NOTE — Telephone Encounter (Signed)
error 

## 2016-11-02 NOTE — Progress Notes (Signed)
[place] at [facility] [address] [place], [place] [address] [phone]  Date:  11/02/2016   Name:  Carol [name]   DOB:  1927-12-18   MRN:  [id number]  PCP:  Carol Blinks, MD    Chief Complaint: Establish Care (Pt here to est care. New to the area. c/o left knee pain noticed more while walking  and going from sitting to standing. )   History of Present Illness:  Carol [name] is a 83 y.o. very pleasant female patient who presents with the following:  Was seen on 10-04-16 for an ED follow-up visit.  HPI from this visit:  Here today as a new patient to me for ER follow-up History of a fib, hyperlipidemia, HTN, stoke in approx 2010.  She was in the ER yesterday with complaint of speech disturbance- for several days prior to her ER visit she had noted a feeling of having trouble forming words with her mouth and her speech being "garbled."  She denies any difficulty thinking of the needed word.  She has not noted any other neurological change.  in the ER she had a CT that was unremarkable. Reviewed ED notes- they planned for neurology follow-up and MRI within a week. She also had an EKG that showed SR    They actually have an appt with Dr. Krista [name] for next week. Will ask her if she wants me to order an MRI or defer to their appt   She also has noted difficulty hearing out of her right ear- she is not sure if she might have ear wax or if her hearing is just decreasing  She has a history of atrial fib and has already established care with cardiology- Dr. Marlou [name].  Reviewed his note from December of last year. Her most recent EF was 50%  She is on cardizem and metoprolol for rate control, losartan and eliquis, bumex prn   HPI for today's visit:  Had CT scan and MRI per neurology, which just showed "I am getting older"  Moved here recently from [facility] [name] area).  Her brother and sister-in-law live in [place].  Her children still  live in [place].  Drove to this appointment by herself.  Feeling well today, but her left knee is bothering her.  She went walking with dress shoes on.  She normally wears her walking shoes when she does any significant time on her feet.  This was about 1 week ago.  Has not taken any OTC pain meds.  She stated that she is just going to tough it out.  Hurts more when she gets up, after sitting for awhile.  She thinks that the pain is more in the joint, the knee cap.  She feels much better, when she takes all of her medications. Is not taking Bumex.  Just takes her 4 heart medications.    Is being followed by Cardiology Carol [name]).    Denies any urinary or GI symptoms.  Has had more allergies since she moved here.  She has taken Claritin before with some relief.  Is not taking any now.  Does have some clear nasal drainage.  She has been told that she had osteopenia in the past. She is due for a bone density exam and would like to have this set up for her  Patient Active Problem List   Diagnosis Date Noted  . Mild cognitive disorder 10/10/2016  . Chronic anticoagulation 10/04/2016  . History of  atrial fibrillation 10/04/2016  . Essential hypertension, benign 09/01/2016  . Paroxysmal supraventricular tachycardia ([place]) 09/01/2016  . Hyperlipidemia 09/01/2016    Past Medical History:  Diagnosis Date  . A-fib ([place])   . Anemia   . Arrhythmia   . Blood transfusion without reported diagnosis   . Breast cancer ([place])   . Cancer ([place])   . Cataract   . CHF (congestive heart failure) ([place])   . Dyslipidemia   . Hyperlipidemia   . Hypertension   . Paroxysmal supraventricular tachycardia ([place])   . Stroke [facility])     Past Surgical History:  Procedure Laterality Date  . MASTECTOMY    . TONSILLECTOMY    . WRIST SURGERY      Social History  Substance Use Topics  . Smoking status: Never Smoker  . Smokeless tobacco: Never Used  . Alcohol use No    Family History  Problem Relation Age of  Onset  . Epilepsy Mother   . Stroke Father   . Heart disease Maternal Aunt     Allergies  Allergen Reactions  . Zoloft [Sertraline Hcl]     headache    Medication list has been reviewed and updated.  Current Outpatient Prescriptions on File Prior to Visit  Medication Sig Dispense Refill  . bumetanide (BUMEX) 1 MG tablet Take 1 mg by mouth daily as needed (swelling).    Marland Kitchen ELIQUIS 5 MG TABS tablet TAKE 1 TABLET(5 MG) BY MOUTH TWICE DAILY 60 tablet 5  . losartan (COZAAR) 50 MG tablet Take 1 tablet (50 mg total) by mouth daily. 90 tablet 1  . diltiazem (CARDIZEM CD) 120 MG 24 hr capsule Take 1 capsule (120 mg total) by mouth daily. 90 capsule 1  . metoprolol succinate (TOPROL-XL) 100 MG 24 hr tablet Take 1 tablet (100 mg total) by mouth daily. Take with or immediately following a meal. 90 tablet 1   No current facility-administered medications on file prior to visit.     Review of Systems:  As per HPI- otherwise negative.   Physical Examination: Vitals:   11/02/16 0950  BP: 126/62  Pulse: (!) 57  Temp: 97.5 F (36.4 C)   Vitals:   11/02/16 0950  Weight: 143 lb (64.9 kg)  Height: 5\' 2"  (1.575 m)   Body mass index is 26.16 kg/m. Ideal Body Weight: Weight in (lb) to have BMI = 25: 136.4  GEN: WDWN, NAD, Non-toxic, A & O x 3, well appearing older lady HEENT: Atraumatic, Normocephalic. Neck supple. No masses, No LAD. Ears and Nose: No external deformity. CV: RRR, No M/G/R. No JVD. No thrill. No extra heart sounds. PULM: CTA B, no wheezes, crackles, rhonchi. No retractions. No resp. distress. No accessory muscle use. EXTR: No c/c/e NEURO Normal gait.  PSYCH: Normally interactive. Conversant. Not depressed or anxious appearing.  Calm demeanor.  Left knee: normal ROM, no crepitus or swelling No effusion or redness.  Knee is stable, no acute tenderness on exam   Assessment and Plan:  Acute pain of left knee  Immunization due - Plan: Pneumococcal conjugate vaccine  13-valent IM  Estrogen deficiency - Plan: DG Bone Density  History of osteopenia - Plan: DG Bone Density  For the time being she will continue to monitor her knee pain, will let me know if not going away Due for prevnar, given today Arrange for dexa scan  See patient instructions for more details.     Signed Carol Blinks, MD

## 2016-11-02 NOTE — Telephone Encounter (Signed)
Pt's daughter called request MRI and lab results

## 2016-11-07 ENCOUNTER — Ambulatory Visit (HOSPITAL_BASED_OUTPATIENT_CLINIC_OR_DEPARTMENT_OTHER)
Admission: RE | Admit: 2016-11-07 | Discharge: 2016-11-07 | Disposition: A | Discharge status: Home / Self Care | Payer: Medicare HMO | Source: Ambulatory Visit | Attending: Family Medicine | Admitting: Family Medicine

## 2016-11-07 DIAGNOSIS — E2839 Other primary ovarian failure: Secondary | ICD-10-CM | POA: Diagnosis not present

## 2016-11-07 DIAGNOSIS — Z8739 Personal history of other diseases of the musculoskeletal system and connective tissue: Secondary | ICD-10-CM

## 2016-11-07 DIAGNOSIS — M8588 Other specified disorders of bone density and structure, other site: Secondary | ICD-10-CM | POA: Insufficient documentation

## 2016-11-12 ENCOUNTER — Encounter: Payer: Self-pay | Admitting: Family Medicine

## 2016-11-12 ENCOUNTER — Telehealth: Payer: Self-pay | Admitting: Family Medicine

## 2016-11-12 DIAGNOSIS — M8580 Other specified disorders of bone density and structure, unspecified site: Secondary | ICD-10-CM | POA: Insufficient documentation

## 2016-11-12 NOTE — Telephone Encounter (Signed)
Called her to go over her bone density.  She has osteopenia, but her 10 year hip fracture risk is nearly 5%.  Recommended medical treatment.  However, she would prefer to increase her exercise, calcium and vitamin D and repeat in one year.  Given her age this is certainly a reasonable choice.  She points out that for someone her age, a 5% risk of something happening in the next 10 years is not that bad and I have to concede this point!

## 2016-12-06 ENCOUNTER — Other Ambulatory Visit: Payer: Self-pay | Admitting: Cardiology

## 2016-12-06 DIAGNOSIS — I4891 Unspecified atrial fibrillation: Secondary | ICD-10-CM

## 2016-12-07 ENCOUNTER — Other Ambulatory Visit: Payer: Self-pay | Admitting: *Deleted

## 2016-12-07 DIAGNOSIS — I4891 Unspecified atrial fibrillation: Secondary | ICD-10-CM

## 2016-12-07 MED ORDER — DILTIAZEM HCL ER COATED BEADS 120 MG PO CP24: 120.0000 mg | ORAL_CAPSULE | Freq: Every day | ORAL | 2 refills | Status: DC

## 2016-12-20 ENCOUNTER — Ambulatory Visit (INDEPENDENT_AMBULATORY_CARE_PROVIDER_SITE_OTHER): Payer: Medicare HMO | Admitting: Family Medicine

## 2016-12-20 VITALS — BP 120/60 | HR 56 | Temp 97.5°F | Ht 62.0 in | Wt 150.4 lb

## 2016-12-20 DIAGNOSIS — Z4802 Encounter for removal of sutures: Secondary | ICD-10-CM

## 2016-12-20 NOTE — Progress Notes (Signed)
Pre visit review using our clinic tool,if applicable. No additional management support is needed unless otherwise documented below in the visit note.  

## 2016-12-20 NOTE — Progress Notes (Signed)
[place] at [facility] [address], [place], [place] [address] [phone]  Date:  12/20/2016   Name:  Carol [name]   DOB:  14-Nov-1927   MRN:  [id number]  PCP:  Lamar Blinks, MD    Chief Complaint: Suture / Staple Removal   History of Present Illness:  Carol [name] is a 83 y.o. very pleasant female patient who presents with the following:  She was seen in the ER 10 days ago at [facility]- she tripped and fell, hit her head and had to get 8 stitches in her forehead. She had a CT of her head and neck which were normal.  She is here today to have her sutures out. She reports that she is doing well, she has no headaches, nausea or vomiting.  Her head does not hurt and she feels like the laceration has healed up well  She is not sure of the date of her last tetanus shot but does feel sure that it was less than 10 years ago    BP Readings from Last 3 Encounters:  12/20/16 (!) 127/46  11/02/16 126/62  10/10/16 122/68   Pulse Readings from Last 3 Encounters:  12/20/16 (!) 56  11/02/16 (!) 57  10/10/16 (!) 56     Patient Active Problem List   Diagnosis Date Noted  . Osteopenia 11/12/2016  . Mild cognitive disorder 10/10/2016  . Chronic anticoagulation 10/04/2016  . History of atrial fibrillation 10/04/2016  . Essential hypertension, benign 09/01/2016  . Paroxysmal supraventricular tachycardia ([place]) 09/01/2016  . Hyperlipidemia 09/01/2016    Past Medical History:  Diagnosis Date  . A-fib ([place])   . Anemia   . Arrhythmia   . Blood transfusion without reported diagnosis   . Breast cancer ([place])   . Cancer ([place])   . Cataract   . CHF (congestive heart failure) ([place])   . Dyslipidemia   . Hyperlipidemia   . Hypertension   . Paroxysmal supraventricular tachycardia ([place])   . Stroke [facility])     Past Surgical History:  Procedure Laterality Date  . MASTECTOMY    . TONSILLECTOMY    . WRIST SURGERY      Social History   Substance Use Topics  . Smoking status: Never Smoker  . Smokeless tobacco: Never Used  . Alcohol use No    Family History  Problem Relation Age of Onset  . Epilepsy Mother   . Stroke Father   . Heart disease Maternal Aunt     Allergies  Allergen Reactions  . Zoloft [Sertraline Hcl]     headache    Medication list has been reviewed and updated.  Current Outpatient Prescriptions on File Prior to Visit  Medication Sig Dispense Refill  . bumetanide (BUMEX) 1 MG tablet Take 1 mg by mouth daily as needed (swelling).    Marland Kitchen diltiazem (CARDIZEM CD) 120 MG 24 hr capsule Take 1 capsule (120 mg total) by mouth daily. 90 capsule 2  . ELIQUIS 5 MG TABS tablet TAKE 1 TABLET(5 MG) BY MOUTH TWICE DAILY 60 tablet 5  . losartan (COZAAR) 50 MG tablet Take 1 tablet (50 mg total) by mouth daily. 90 tablet 2  . metoprolol succinate (TOPROL-XL) 100 MG 24 hr tablet Take 1 tablet (100 mg total) by mouth daily. Take with or immediately following a meal. 90 tablet 2   No current facility-administered medications on file prior to visit.     Review of Systems:  As per  HPI- otherwise negative.   Physical Examination: Vitals:   12/20/16 1436  BP: (!) 127/46  Pulse: (!) 56  Temp: 97.5 F (36.4 C)   Vitals:   12/20/16 1436  Weight: 150 lb 6.4 oz (68.2 kg)  Height: 5\' 2"  (1.575 m)   Body mass index is 27.51 kg/m. Ideal Body Weight: Weight in (lb) to have BMI = 25: 136.4   GEN: WDWN, NAD, Non-toxic, Alert & Oriented x 3 HEENT: Atraumatic, Normocephalic.  Ears and Nose: No external deformity. EXTR: No clubbing/cyanosis/edema NEURO: Normal gait.  PSYCH: Normally interactive. Conversant. Not depressed or anxious appearing.  Calm demeanor.  Well healed laceration on her left forehead.  Removed 8 SI sutures without any difficulty   Assessment and Plan: Visit for suture removal  Removed sutures today Wound has healed well Pt had negative CT scans at time of injury and is feeling well  today She did not faint- she tripped and fell She is seeing cardiology tomorrow- will ask them if they might want to titrate down her BP meds as her BP is a bit soft   Signed Lamar Blinks, MD

## 2016-12-21 ENCOUNTER — Ambulatory Visit (INDEPENDENT_AMBULATORY_CARE_PROVIDER_SITE_OTHER): Payer: Medicare HMO | Admitting: Physician Assistant

## 2016-12-21 ENCOUNTER — Encounter (INDEPENDENT_AMBULATORY_CARE_PROVIDER_SITE_OTHER): Payer: Self-pay

## 2016-12-21 ENCOUNTER — Encounter: Payer: Self-pay | Admitting: Physician Assistant

## 2016-12-21 VITALS — BP 132/50 | HR 63 | Ht 61.0 in | Wt 151.4 lb

## 2016-12-21 DIAGNOSIS — I10 Essential (primary) hypertension: Secondary | ICD-10-CM

## 2016-12-21 DIAGNOSIS — Z7901 Long term (current) use of anticoagulants: Secondary | ICD-10-CM

## 2016-12-21 DIAGNOSIS — I420 Dilated cardiomyopathy: Secondary | ICD-10-CM

## 2016-12-21 DIAGNOSIS — E785 Hyperlipidemia, unspecified: Secondary | ICD-10-CM | POA: Diagnosis not present

## 2016-12-21 DIAGNOSIS — I480 Paroxysmal atrial fibrillation: Secondary | ICD-10-CM | POA: Diagnosis not present

## 2016-12-21 DIAGNOSIS — I4891 Unspecified atrial fibrillation: Secondary | ICD-10-CM | POA: Diagnosis not present

## 2016-12-21 MED ORDER — DILTIAZEM HCL ER COATED BEADS 120 MG PO CP24: 120.0000 mg | ORAL_CAPSULE | Freq: Every day | ORAL | 2 refills | Status: DC

## 2016-12-21 MED ORDER — APIXABAN 5 MG PO TABS: ORAL_TABLET | ORAL | 1 refills | Status: DC

## 2016-12-21 MED ORDER — BUMETANIDE 1 MG PO TABS: 1.0000 mg | ORAL_TABLET | Freq: Every day | ORAL | 1 refills | Status: DC | PRN

## 2016-12-21 MED ORDER — METOPROLOL SUCCINATE ER 100 MG PO TB24: 100.0000 mg | ORAL_TABLET | Freq: Every day | ORAL | 1 refills | Status: DC

## 2016-12-21 MED ORDER — LOSARTAN POTASSIUM 50 MG PO TABS: 50.0000 mg | ORAL_TABLET | Freq: Every day | ORAL | 1 refills | Status: DC

## 2016-12-21 NOTE — Progress Notes (Signed)
Cardiology Office Note    Date:  12/22/2016   ID:  Carol [name], DOB 1928/04/27, MRN [id number]  PCP:  Lamar Blinks, MD  Cardiologist:  Dr. Marlou Porch   CC: follow up   History of Present Illness:  Carol [name] is a 83 y.o. female with a history of paroxysmal atrial fibrillation, history of tachycardia induced CM, HTN, breast cancer s/p bilateral mastectomy followed by chemoradiation, CVA (~2010) and HLD who presents to clinic for follow up.   Previously felt to have tachycardia induced CM. In 11/2015 her ejection fraction was 40% but this improved to 50% a month later after improved rate control. She switched insurances and now has Medicare part D. Previously the Eliquis was not affordable for her at $400 a month. She had a stress test 10/2013 which was low risk.   She was seen by Dr. Marlou Porch in the office on 08/01/16 for new patient evaluation since she had just moved from [facility]. She was found to be in afib with RVR. She admitted to not taking all her home medications since th move to [facility]. She was restarted on home meds of Toprol Xl 100mg  daily and Cardizem 120mg  daily. Continued on Bumex PRN for s/s CHF. She actually takes this every other day.   Seen in ER in 09/2016 for garbled speech. CT with no acute changes. MRI with no evidence for acute or subacute ischemia, but did show generalized age-related cerebral atrophy with mild chronic microvascular ischemic disease. She is followed closely by Dr. Darl Householder with neurology and Dr. Lorelei Pont.   She was seen at [facility] on 12/10/16 after she tripped and fell. She got 8 stitches to forehead. Dr. Lorelei Pont removed sutures yesterday. The patient was clear that she did not faint but had a mechanical fall. Dr. Lorelei Pont wondered if BP meds may need to be adjusted given soft BPs.  Today she presents to clinic for follow up. She has been feeling good. No CP or SOB. No LE edema, orthopnea or PND. No dizziness or syncope. No blood in stool or urine. She  occasionally gets palpitations when she drinks too much coffee. BP has been running okay to her knowledge.     Past Medical History:  Diagnosis Date  . A-fib ([place])   . Anemia   . Arrhythmia   . Blood transfusion without reported diagnosis   . Breast cancer ([place])   . Cancer ([place])   . Cataract   . CHF (congestive heart failure) ([place])   . Dyslipidemia   . Hyperlipidemia   . Hypertension   . Paroxysmal supraventricular tachycardia ([place])   . Stroke [facility])     Past Surgical History:  Procedure Laterality Date  . MASTECTOMY    . TONSILLECTOMY    . WRIST SURGERY      Current Medications: Outpatient Medications Prior to Visit  Medication Sig Dispense Refill  . bumetanide (BUMEX) 1 MG tablet Take 1 mg by mouth daily as needed (swelling).    Marland Kitchen diltiazem (CARDIZEM CD) 120 MG 24 hr capsule Take 1 capsule (120 mg total) by mouth daily. 90 capsule 2  . ELIQUIS 5 MG TABS tablet TAKE 1 TABLET(5 MG) BY MOUTH TWICE DAILY 60 tablet 5  . losartan (COZAAR) 50 MG tablet Take 1 tablet (50 mg total) by mouth daily. 90 tablet 2  . metoprolol succinate (TOPROL-XL) 100 MG 24 hr tablet Take 1 tablet (100 mg total) by mouth daily. Take with or immediately following a meal. 90 tablet 2   No  facility-administered medications prior to visit.      Allergies:   Sertraline and Zoloft [sertraline hcl]   Social History   Social History  . Marital status: Widowed    Spouse name: N/A  . Number of children: 2  . Years of education: 2 years college   Occupational History  . Retired    Social History Main Topics  . Smoking status: Never Smoker  . Smokeless tobacco: Never Used  . Alcohol use No  . Drug use: No  . Sexual activity: Not Asked   Other Topics Concern  . None   Social History Narrative   Lives in a retirement community.   Right-handed.   Only drinks caffeine some days.     Family History:  The patient's family history includes Epilepsy in her mother; Heart disease in her maternal  aunt; Stroke in her father.      ROS:   Please see the history of present illness.    ROS All other systems reviewed and are negative.   PHYSICAL EXAM:   VS:  BP (!) 132/50   Pulse 63   Ht 5\' 1"  (1.549 m)   Wt 151 lb 6.4 oz (68.7 kg)   LMP  (LMP Unknown)   SpO2 97%   BMI 28.61 kg/m    GEN: Well nourished, well developed, in no acute distress  HEENT: normal  Neck: no JVD, carotid bruits, or masses Cardiac: RR, brady; no murmurs, rubs, or gallops,no edema  Respiratory:  clear to auscultation bilaterally, normal work of breathing GI: soft, nontender, nondistended, + BS MS: no deformity or atrophy  Skin: warm and dry, no rash Neuro:  Alert and Oriented x 3, Strength and sensation are intact Psych: euthymic mood, full affect    Wt Readings from Last 3 Encounters:  12/21/16 151 lb 6.4 oz (68.7 kg)  12/20/16 150 lb 6.4 oz (68.2 kg)  11/02/16 143 lb (64.9 kg)      Studies/Labs Reviewed:   EKG:  EKG is ordered today.  The ekg ordered today demonstrates sinus bradycardia, HR 53.   Recent Labs: 09/12/2016: TSH 1.990 10/03/2016: ALT 29; BUN 18; Creatinine, Ser 0.69; Hemoglobin 14.6; Platelets 226; Potassium 4.6; Sodium 135   Lipid Panel    Component Value Date/Time   CHOL 182 09/12/2016 0943   TRIG 72 09/12/2016 0943   HDL 81 09/12/2016 0943   CHOLHDL 2.2 09/12/2016 0943   LDLCALC 87 09/12/2016 0943    Additional studies/ records that were reviewed today include:  ECHO 12/2015 EF 50%, mild LAE, mod RAE, mild AR/MR/TR, mild pulm HTN, indeterminate diastolic function (in afib) with elevated filling pressures   ASSESSMENT & PLAN:   Paroxysmal atrial fibrillation: previously felt to be chronic but today she is in sinus bradycardia. Continue Toprol XL 100mg  daily and diltiazem 120mg  daily. Continue Eliquis 5mg  BID for CHADSVASC of at least 4 (HTN, age, F sex).    History of tachycardia induced CM: EF normalized by echo in 12/2015. No s/s volume overload. SHe has been  taking bumex QOD because she didn't remember what she was supposed to do. I have asked her to only take this when she notices LE edema or weight gain  HTN: BP has been well controlled. There was some concern that her BP was too low; however, BP is okay today. I have asked her to cut back on Bumex from every other day to PRN.   HLD: LDL 87 in 08/2016. Continue diet and exercise.  Medication Adjustments/Labs and Tests Ordered: Current medicines are reviewed at length with the patient today.  Concerns regarding medicines are outlined above.  Medication changes, Labs and Tests ordered today are listed in the Patient Instructions below. Patient Instructions  Medication Instructions:  Your physician recommends that you continue on your current medications as directed. Please refer to the Current Medication list given to you today.   Labwork: None ordered  Testing/Procedures: None ordered  Follow-Up: Your physician wants you to follow-up in: [place] will receive a reminder letter in the mail two months in advance. If you don't receive a letter, please call our office to schedule the follow-up appointment.    Any Other Special Instructions Will Be Listed Below (If Applicable).     If you need a refill on your cardiac medications before your next appointment, please call your pharmacy.      Mable Fill, PA-C  12/22/2016 11:42 AM    [facility] [address], [place], [place]  [address] Phone: [phone]; Fax: [phone]

## 2016-12-21 NOTE — Patient Instructions (Addendum)

## 2017-01-08 ENCOUNTER — Ambulatory Visit: Payer: Medicare HMO | Admitting: Neurology

## 2017-04-10 ENCOUNTER — Other Ambulatory Visit: Payer: Self-pay | Admitting: Cardiology

## 2017-04-10 DIAGNOSIS — I4891 Unspecified atrial fibrillation: Secondary | ICD-10-CM

## 2017-07-09 ENCOUNTER — Encounter: Payer: Self-pay | Admitting: Cardiology

## 2017-07-24 ENCOUNTER — Encounter: Payer: Self-pay | Admitting: Cardiology

## 2017-07-24 ENCOUNTER — Encounter (INDEPENDENT_AMBULATORY_CARE_PROVIDER_SITE_OTHER): Payer: Self-pay

## 2017-07-24 ENCOUNTER — Ambulatory Visit: Payer: Medicare HMO | Admitting: Cardiology

## 2017-07-24 VITALS — BP 108/60 | HR 64 | Ht 61.0 in | Wt 152.8 lb

## 2017-07-24 DIAGNOSIS — I482 Chronic atrial fibrillation: Secondary | ICD-10-CM

## 2017-07-24 DIAGNOSIS — Z7901 Long term (current) use of anticoagulants: Secondary | ICD-10-CM | POA: Diagnosis not present

## 2017-07-24 DIAGNOSIS — I4821 Permanent atrial fibrillation: Secondary | ICD-10-CM

## 2017-07-24 DIAGNOSIS — E785 Hyperlipidemia, unspecified: Secondary | ICD-10-CM

## 2017-07-24 NOTE — Progress Notes (Signed)
Cardiology Office Note    Date:  07/24/2017   ID:  Carol [name], DOB 08-Oct-1927, MRN [id number]  PCP:  Darreld Mclean, MD  Cardiologist:   Candee Furbish, MD     History of Present Illness:  Carol [name] is a 83 y.o. female with AFIB RVR here for follow up. Came from moving from [place].   Previously in April her ejection fraction was 40% but this improved to 50% a month later after improved rate control. She switched insurances and now has Medicare part D. Previously the Eliquis was not affordable for her at $400 a month.  She admits that she's been off of some of the medications since moving to [place]. She is now living independently in an apartment. She is here with her friend today who knows Nell Range, [place].  Had a small stroke she was told at the time treated for breast cancer. Saw neuro. 10 years ago.   No DM,   +HTN  No PVD  She denies any prior myocardial infarctions. She seems younger than stated age.  07/24/17-overall doing very well, no chest pain, no shortness of breath, no orthopnea, no PND, no bleeding.  She is tolerating her medications well she asked about timing of medications.  She takes them in the morning except for her Eliquis also in the evening.  No shortness of breath.  Past Medical History:  Diagnosis Date  . A-fib ([place])   . Anemia   . Arrhythmia   . Blood transfusion without reported diagnosis   . Breast cancer ([place])   . Cancer ([place])   . Cataract   . CHF (congestive heart failure) ([place])   . Dyslipidemia   . Hyperlipidemia   . Hypertension   . Paroxysmal supraventricular tachycardia ([place])   . Stroke [facility])     Past Surgical History:  Procedure Laterality Date  . MASTECTOMY    . TONSILLECTOMY    . WRIST SURGERY      Current Medications: Outpatient Medications Prior to Visit  Medication Sig Dispense Refill  . bumetanide (BUMEX) 1 MG tablet Take 1 tablet (1 mg total) by mouth daily as needed (swelling). 90 tablet 1    . diltiazem (CARDIZEM CD) 120 MG 24 hr capsule Take 1 capsule (120 mg total) by mouth daily. 90 capsule 2  . ELIQUIS 5 MG TABS tablet TAKE 1 TABLET TWICE DAILY 180 tablet 1  . losartan (COZAAR) 50 MG tablet Take 1 tablet (50 mg total) by mouth daily. 90 tablet 1  . metoprolol succinate (TOPROL-XL) 100 MG 24 hr tablet Take 1 tablet (100 mg total) by mouth daily. Take with or immediately following a meal. 90 tablet 1   No facility-administered medications prior to visit.      Allergies:   Sertraline and Zoloft [sertraline hcl]   Social History   Socioeconomic History  . Marital status: Widowed    Spouse name: None  . Number of children: 2  . Years of education: 2 years college  . Highest education level: None  Social Needs  . Financial resource strain: None  . Food insecurity - worry: None  . Food insecurity - inability: None  . Transportation needs - medical: None  . Transportation needs - non-medical: None  Occupational History  . Occupation: Retired  Tobacco Use  . Smoking status: Never Smoker  . Smokeless tobacco: Never Used  Substance and Sexual Activity  . Alcohol use: No  . Drug use: No  . Sexual activity:  None  Other Topics Concern  . None  Social History Narrative   Lives in a retirement community.   Right-handed.   Only drinks caffeine some days.     Family History:  The patient's Mother died early in her life. No CAD. Had epilespy.    ROS:   Please see the history of present illness.   No bleeding, no orthopnea, no PND, no syncope. Review of Systems  All other systems reviewed and are negative.  All other systems reviewed and are negative.   PHYSICAL EXAM:   VS:  BP 108/60   Pulse 64   Ht 5\' 1"  (1.549 m)   Wt 152 lb 12.8 oz (69.3 kg)   LMP  (LMP Unknown)   SpO2 95%   BMI 28.87 kg/m    GEN: Well nourished, well developed, in no acute distress  HEENT: normal  Neck: no JVD, carotid bruits, or masses Cardiac: irreg irreg; no murmurs, rubs, or  gallops,no edema  Respiratory:  clear to auscultation bilaterally, normal work of breathing GI: soft, nontender, nondistended, + BS MS: no deformity or atrophy  Skin: warm and dry, no rash Neuro:  Alert and Oriented x 3, Strength and sensation are intact Psych: euthymic mood, full affect   Wt Readings from Last 3 Encounters:  07/24/17 152 lb 12.8 oz (69.3 kg)  12/21/16 151 lb 6.4 oz (68.7 kg)  12/20/16 150 lb 6.4 oz (68.2 kg)      Studies/Labs Reviewed:   EKG:  EKG is ordered today.  The ekg ordered today demonstrates 08/01/16-atrial fibrillation heart rate 122 bpm, rapid ventricular response, left axis deviation.  Recent Labs: 09/12/2016: TSH 1.990 10/03/2016: ALT 29; BUN 18; Creatinine, Ser 0.69; Hemoglobin 14.6; Platelets 226; Potassium 4.6; Sodium 135   Lipid Panel    Component Value Date/Time   CHOL 182 09/12/2016 0943   TRIG 72 09/12/2016 0943   HDL 81 09/12/2016 0943   CHOLHDL 2.2 09/12/2016 0943   LDLCALC 87 09/12/2016 0943    Additional studies/ records that were reviewed today include:  Prior office notes reviewed, lab work reviewed, EKG reviewed    ASSESSMENT:    1. Permanent atrial fibrillation ([place])   2. Chronic anticoagulation   3. Hyperlipidemia, unspecified hyperlipidemia type      PLAN:  In order of problems listed above:  Permanent atrial fibrillation  - Trying to improve rate control.  - Currently on metoprolol succinate 100 mg and Cardizem CD 120 mg once a day.  Doing very well.  Chronic anticoagulation  - Eliquis 5 mg twice a day  - Chads-Vasc score- (2-age, 1-HTN, 2-CVA, 1 CHF) - 6  - Expressed the importance to her taking this medication for stroke prevention especially in the light of her prior mini stroke approximate 10 years ago. Went over risk score is above.  Doing very well, no bleeding episodes.  Stable.  Cardiomyopathy   - Ejection fraction 50% up from 40% in May 2017. Asymptomatic, rate control. Prior stress test March 2015.  Possibly from tachycardia. Prior stress test was reassuring. This was performed in 10/2013.  - Decided to use Bumex on an as-needed basis, 1 mg. She has not had any problems with fluid overload.  Doing very well, no changes.  No shortness of breath currently.  Essential hypertension  - Losartan 50 mg daily.  Blood pressure is excellent.  She is not having any dizziness.  Continue.  Prior creatinine 0.7. Hemoglobin 14.9, ALT 22, TSH 2.1. Prior EKGs reviewed with A.  fib RVR and nonspecific ST-T wave changes.  One year follow-up with me.  Medication Adjustments/Labs and Tests Ordered: Current medicines are reviewed at length with the patient today.  Concerns regarding medicines are outlined above.  Medication changes, Labs and Tests ordered today are listed in the Patient Instructions below. Patient Instructions  Medication Instructions:  The current medical regimen is effective;  continue present plan and medications.  Follow-Up: Follow up in 1 year with Dr. Marlou Porch.  You will receive a letter in the mail 2 months before you are due.  Please call us when you receive this letter to schedule your follow up appointment.  If you need a refill on your cardiac medications before your next appointment, please call your pharmacy.  Thank you for choosing [facility]!!        Signed, Candee Furbish, MD  07/24/2017 4:19 PM    [facility] [place], [place], [place]  [address] Phone: [phone]; Fax: [phone]

## 2017-07-24 NOTE — Patient Instructions (Signed)

## 2017-08-31 ENCOUNTER — Ambulatory Visit: Payer: Medicare HMO | Admitting: Cardiology

## 2017-11-06 NOTE — Progress Notes (Signed)
[facility] [address] [place], [place] [address] [phone]  Date:  11/07/2017   Name:  Carol [name]   DOB:  [birth year]   MRN:  [id number]  PCP:  Darreld Mclean, MD    Chief Complaint: No chief complaint on file.   History of Present Illness:  Carol [name] is a 84 y.o. very pleasant female patient who presents with the following:  Here with concern of leg pain today History of a fib on anticoagulation, HTN, hyperlipidemia Last seen here about a year ago Her cardiologist is Dr. Marlou Porch- visit with  him in November:  Permanent atrial fibrillation  - Trying to improve rate control.  - Currently on metoprolol succinate 100 mg and Cardizem CD 120 mg once a day.  Doing very well. Chronic anticoagulation  - Eliquis 5 mg twice a day  - Chads-Vasc score- (2-age, 1-HTN, 2-CVA, 1 CHF) - 6  - Expressed the importance to her taking this medication for stroke prevention especially in the light of her prior mini stroke approximate 10 years ago. Went over risk score is above.  Doing very well, no bleeding episodes.  Stable. Cardiomyopathy   - Ejection fraction 50% up from 40% in May 2017. Asymptomatic, rate control. Prior stress test March 2015. Possibly from tachycardia. Prior stress test was reassuring. This was performed in 10/2013.  - Decided to use Bumex on an as-needed basis, 1 mg. She has not had any problems with fluid overload.  Doing very well, no changes.  No shortness of breath currently. Essential hypertension  - Losartan 50 mg daily.  Blood pressure is excellent.  She is not having any dizziness.  Continue.  I last saw her about a year ago  Flu shot today  She noted right leg pain for about 3 weeks She thought it would go away but it has not The main issue is the back of her knee, but it can travel down the calf She thinks that her knee is a bit swollen It does hurt to walk on the knee She likes to do  some exercises in the morning- about 3 weeks ago she added some leg exercises, did a few squats which is unusual for her.  The next morning she had the pain   The leg and knee do not ache at rest generally  The knee does not feel unstable It is not popping or clicking  BP Readings from Last 3 Encounters:  11/07/17 (!) 130/52  07/24/17 108/60  12/21/16 (!) 132/50    Patient Active Problem List   Diagnosis Date Noted  . Osteopenia 11/12/2016  . Mild cognitive disorder 10/10/2016  . Chronic anticoagulation 10/04/2016  . History of atrial fibrillation 10/04/2016  . Essential hypertension, benign 09/01/2016  . Paroxysmal supraventricular tachycardia ([place]) 09/01/2016  . Hyperlipidemia 09/01/2016    Past Medical History:  Diagnosis Date  . A-fib ([place])   . Anemia   . Arrhythmia   . Blood transfusion without reported diagnosis   . Breast cancer ([place])   . Cancer ([place])   . Cataract   . CHF (congestive heart failure) ([place])   . Dyslipidemia   . Hyperlipidemia   . Hypertension   . Paroxysmal supraventricular tachycardia ([place])   . Stroke [facility])     Past Surgical History:  Procedure Laterality Date  . MASTECTOMY    . TONSILLECTOMY    . WRIST SURGERY      Social  History   Tobacco Use  . Smoking status: Never Smoker  . Smokeless tobacco: Never Used  Substance Use Topics  . Alcohol use: No  . Drug use: No    Family History  Problem Relation Age of Onset  . Epilepsy Mother   . Stroke Father   . Heart disease Maternal Aunt     Allergies  Allergen Reactions  . Sertraline   . Zoloft [Sertraline Hcl]     headache    Medication list has been reviewed and updated.  Current Outpatient Medications on File Prior to Visit  Medication Sig Dispense Refill  . bumetanide (BUMEX) 1 MG tablet Take 1 tablet (1 mg total) by mouth daily as needed (swelling). 90 tablet 1  . ELIQUIS 5 MG TABS tablet TAKE 1 TABLET TWICE DAILY 180 tablet 1  . losartan (COZAAR) 50 MG tablet Take 1  tablet (50 mg total) by mouth daily. 90 tablet 1  . metoprolol succinate (TOPROL-XL) 100 MG 24 hr tablet Take 1 tablet (100 mg total) by mouth daily. Take with or immediately following a meal. 90 tablet 1   No current facility-administered medications on file prior to visit.     Review of Systems:  As per HPI- otherwise negative.   Physical Examination: Vitals:   11/07/17 1506  BP: (!) 130/52  Pulse: (!) 52  Resp: 16  Temp: 98.4 F (36.9 C)  SpO2: 98%   Vitals:   11/07/17 1506  Weight: 155 lb 6.4 oz (70.5 kg)  Height: 5' 0.5" (1.537 m)   Body mass index is 29.85 kg/m. Ideal Body Weight: Weight in (lb) to have BMI = 25: 129.9  GEN: WDWN, NAD, Non-toxic, A & O x 3, looks well HEENT: Atraumatic, Normocephalic. Neck supple. No masses, No LAD. Ears and Nose: No external deformity. CV: RRR, No M/G/R. No JVD. No thrill. No extra heart sounds. PULM: CTA B, no wheezes, crackles, rhonchi. No retractions. No resp. distress. No accessory muscle use.Marland Kitchen EXTR: No c/c/e NEURO Normal gait.  PSYCH: Normally interactive. Conversant. Not depressed or anxious appearing.  Calm demeanor.  Right knee: joint is stable but she has tenderness to valgus stress and is tender and medial joint line No effusion or redness, no heat Normal ROM No calf swelling or cords  Assessment and Plan: Acute pain of right knee - Plan: DG Knee Complete 4 Views Right, US Venous Img Lower Unilateral Right  Needs flu shot - Plan: Flu vaccine HIGH DOSE PF (Fluzone High dose)   Here today with right knee pain which started after she did just a couple of squats a few weeks ago Will obtain plain films and also [place] to rule-out doppler  Called with [place]- no sign of DVT.  Will refer to Dr, Barbaraann Barthel to look at her knee.  She will let me know if any other concerns in the meantime Await her plain films but assume they will be non- acute Signed Lamar Blinks, MD

## 2017-11-07 ENCOUNTER — Ambulatory Visit (INDEPENDENT_AMBULATORY_CARE_PROVIDER_SITE_OTHER): Payer: Medicare HMO | Admitting: Family Medicine

## 2017-11-07 ENCOUNTER — Ambulatory Visit (HOSPITAL_BASED_OUTPATIENT_CLINIC_OR_DEPARTMENT_OTHER)
Admission: RE | Admit: 2017-11-07 | Discharge: 2017-11-07 | Disposition: A | Discharge status: Home / Self Care | Payer: Medicare HMO | Source: Ambulatory Visit | Attending: Family Medicine | Admitting: Family Medicine

## 2017-11-07 ENCOUNTER — Encounter: Payer: Self-pay | Admitting: Family Medicine

## 2017-11-07 VITALS — BP 130/52 | HR 52 | Temp 98.4°F | Resp 16 | Ht 60.5 in | Wt 155.4 lb

## 2017-11-07 DIAGNOSIS — M25561 Pain in right knee: Secondary | ICD-10-CM | POA: Diagnosis not present

## 2017-11-07 DIAGNOSIS — S8991XA Unspecified injury of right lower leg, initial encounter: Secondary | ICD-10-CM | POA: Diagnosis not present

## 2017-11-07 DIAGNOSIS — R600 Localized edema: Secondary | ICD-10-CM | POA: Diagnosis not present

## 2017-11-07 DIAGNOSIS — Z23 Encounter for immunization: Secondary | ICD-10-CM | POA: Diagnosis not present

## 2017-11-07 NOTE — Patient Instructions (Addendum)
I think that you have skeletal joint pain in your right knee. However, we are going to make sure you do not have a DVT.  Please go to the imaging suite on the ground floor for your ultrasound and x-rays, then you can go home and I will call you

## 2017-11-12 ENCOUNTER — Ambulatory Visit: Payer: Medicare HMO | Admitting: Family Medicine

## 2017-11-15 ENCOUNTER — Ambulatory Visit: Payer: Medicare HMO | Admitting: Family Medicine

## 2017-11-15 ENCOUNTER — Encounter: Payer: Self-pay | Admitting: Family Medicine

## 2017-11-15 DIAGNOSIS — M25561 Pain in right knee: Secondary | ICD-10-CM

## 2017-11-15 MED ORDER — METHYLPREDNISOLONE ACETATE 40 MG/ML IJ SUSP
40.0000 mg | Freq: Once | INTRAMUSCULAR | Status: AC
  Administered 2017-11-15: 40 mg via INTRA_ARTICULAR

## 2017-11-15 NOTE — Patient Instructions (Signed)
Your pain is due to arthritis. These are the different medications you can take for this: Tylenol 500mg  1-2 tabs three times a day for pain. Capsaicin, aspercreme, or biofreeze topically up to four times a day may also help with pain. Some supplements that may help for arthritis: Boswellia extract, curcumin, pycnogenol Cortisone injections are an option - you were given this today. If cortisone injections do not help, there are different types of shots that may help but they take longer to take effect. It's important that you continue to stay active. Straight leg raises, knee extensions 3 sets of 10 once a day (add ankle weight if these become too easy). Consider physical therapy to strengthen muscles around the joint that hurts to take pressure off of the joint itself. Shoe inserts with good arch support may be helpful. Heat or ice 15 minutes at a time 3-4 times a day as needed to help with pain. Water aerobics and cycling with low resistance are the best two types of exercise for arthritis though any exercise is ok as long as it doesn't worsen the pain. Follow up with me in 1 month.

## 2017-11-16 ENCOUNTER — Encounter: Payer: Self-pay | Admitting: Family Medicine

## 2017-11-16 DIAGNOSIS — M25561 Pain in right knee: Secondary | ICD-10-CM | POA: Insufficient documentation

## 2017-11-16 NOTE — Progress Notes (Signed)
PCP: Copland, Gay Filler, MD  Subjective:   HPI: Patient is a 84 y.o. female here for right knee pain.  Patient denies known injury. She reports for about 3 weeks she's had fairly severe pain in right knee. Feels pain into the back of the knee and can go down the leg. Pain level 1/10 but up to 8/10 and sharp when standing. Felt knee buckle when she was in parking lot today. No skin changes, numbness.  Past Medical History:  Diagnosis Date  . A-fib ([place])   . Anemia   . Arrhythmia   . Blood transfusion without reported diagnosis   . Breast cancer ([place])   . Cancer ([place])   . Cataract   . CHF (congestive heart failure) ([place])   . Dyslipidemia   . Hyperlipidemia   . Hypertension   . Paroxysmal supraventricular tachycardia ([place])   . Stroke [facility])     Current Outpatient Medications on File Prior to Visit  Medication Sig Dispense Refill  . bumetanide (BUMEX) 1 MG tablet Take 1 tablet (1 mg total) by mouth daily as needed (swelling). 90 tablet 1  . ELIQUIS 5 MG TABS tablet TAKE 1 TABLET TWICE DAILY 180 tablet 1  . losartan (COZAAR) 50 MG tablet Take 1 tablet (50 mg total) by mouth daily. 90 tablet 1  . metoprolol succinate (TOPROL-XL) 100 MG 24 hr tablet Take 1 tablet (100 mg total) by mouth daily. Take with or immediately following a meal. 90 tablet 1   No current facility-administered medications on file prior to visit.     Past Surgical History:  Procedure Laterality Date  . MASTECTOMY    . TONSILLECTOMY    . WRIST SURGERY      Allergies  Allergen Reactions  . Sertraline   . Zoloft [Sertraline Hcl]     headache    Social History   Socioeconomic History  . Marital status: Widowed    Spouse name: Not on file  . Number of children: 2  . Years of education: 2 years college  . Highest education level: Not on file  Occupational History  . Occupation: Retired  Scientific laboratory technician  . Financial resource strain: Not on file  . Food insecurity:    Worry: Not on file   Inability: Not on file  . Transportation needs:    Medical: Not on file    Non-medical: Not on file  Tobacco Use  . Smoking status: Never Smoker  . Smokeless tobacco: Never Used  Substance and Sexual Activity  . Alcohol use: No  . Drug use: No  . Sexual activity: Not on file  Lifestyle  . Physical activity:    Days per week: Not on file    Minutes per session: Not on file  . Stress: Not on file  Relationships  . Social connections:    Talks on phone: Not on file    Gets together: Not on file    Attends religious service: Not on file    Active member of club or organization: Not on file    Attends meetings of clubs or organizations: Not on file    Relationship status: Not on file  . Intimate partner violence:    Fear of current or ex partner: Not on file    Emotionally abused: Not on file    Physically abused: Not on file    Forced sexual activity: Not on file  Other Topics Concern  . Not on file  Social History Narrative   Lives  in a retirement community.   Right-handed.   Only drinks caffeine some days.    Family History  Problem Relation Age of Onset  . Epilepsy Mother   . Stroke Father   . Heart disease Maternal Aunt     BP (!) 133/59   Pulse (!) 52   Ht 5\' 1"  (1.549 m)   Wt 155 lb (70.3 kg)   LMP  (LMP Unknown)   BMI 29.29 kg/m   Review of Systems: See HPI above.     Objective:  Physical Exam:  Gen: NAD, comfortable in exam room  Right knee: No gross deformity, ecchymoses, swelling. No TTP. FROM. Negative ant/post drawers. Negative valgus/varus testing. Negative lachmanns. Negative mcmurrays, apleys, patellar apprehension. NV intact distally.  Left knee: No deformity. FROM with 5/5 strength. No tenderness to palpation. NVI distally.   Assessment & Plan:  1. Right knee pain - Independently reviewed radiographs and no abnormalities though images not weight-bearing.  Her exam is completely reassuring as well as brief MSK u/s - noted mild  arthropathy/spurring medial compartment but no bakers cyst, visible meniscus tear.  We discussed options.  Given amount of pain she's having and limping we went ahead with injection today.  Tylenol, topical medications, supplements reviewed.  Heat or ice.  Home exercises reviewed.  F/u in 1 month.  After informed written consent timeout was performed, patient was seated on exam table. Right knee was prepped with alcohol swab and utilizing anteromedial approach, patient's right knee was injected intraarticularly with 3:1 bupivicaine: depomedrol. Patient tolerated the procedure well without immediate complications.

## 2017-11-16 NOTE — Assessment & Plan Note (Signed)
Independently reviewed radiographs and no abnormalities though images not weight-bearing.  Her exam is completely reassuring as well as brief MSK u/s - noted mild arthropathy/spurring medial compartment but no bakers cyst, visible meniscus tear.  We discussed options.  Given amount of pain she's having and limping we went ahead with injection today.  Tylenol, topical medications, supplements reviewed.  Heat or ice.  Home exercises reviewed.  F/u in 1 month.  After informed written consent timeout was performed, patient was seated on exam table. Right knee was prepped with alcohol swab and utilizing anteromedial approach, patient's right knee was injected intraarticularly with 3:1 bupivicaine: depomedrol. Patient tolerated the procedure well without immediate complications.

## 2017-12-17 ENCOUNTER — Ambulatory Visit: Payer: Medicare HMO | Admitting: Family Medicine

## 2018-01-15 ENCOUNTER — Other Ambulatory Visit: Payer: Self-pay | Admitting: *Deleted

## 2018-01-15 ENCOUNTER — Telehealth: Payer: Self-pay | Admitting: *Deleted

## 2018-01-15 DIAGNOSIS — I4891 Unspecified atrial fibrillation: Secondary | ICD-10-CM

## 2018-01-15 NOTE — Telephone Encounter (Signed)
[employer] called and requested thirty day refills on patients medications to a local pharmacy to last until her mail order arrives.

## 2018-01-15 NOTE — Telephone Encounter (Signed)
[employer] called on patients behalf to request a short term refills on her medications be sent to [facility] to last until her mail order arrives. They are requesting diltiazem 120 mg however this is not listed on her current med list. It was removed at 11/07/17 office visit with her pcp with a reason of error. Should the patient still be taking? Please advise. Thanks, MI

## 2018-01-15 NOTE — Telephone Encounter (Signed)
According to our documentation at last office visit (07/24/2017) with Dr Marlou Porch, pt is to be taking Diltiazem  (Cardizem CD) 120 mg po daily.  I can not find anywhere in her chart where it has been documented that the [name] should discontinue this medication.  I do see on 11/07/2017 Carol [name], CMA marked this medication as discontinued with the reason listed as "error".  I have attempted to contact both the [name] and her POA Carol [name] to verify the [name] is still taking this medication.  N/A at pt's phone number, left message at number listed for Carol [name] to please return the call to discuss.

## 2018-01-16 MED ORDER — LOSARTAN POTASSIUM 50 MG PO TABS: 50.0000 mg | ORAL_TABLET | Freq: Every day | ORAL | 0 refills | Status: DC

## 2018-01-16 MED ORDER — METOPROLOL SUCCINATE ER 100 MG PO TB24: 100.0000 mg | ORAL_TABLET | Freq: Every day | ORAL | 0 refills | Status: DC

## 2018-01-16 MED ORDER — APIXABAN 5 MG PO TABS: 5.0000 mg | ORAL_TABLET | Freq: Two times a day (BID) | ORAL | 0 refills | Status: DC

## 2018-01-18 NOTE — Telephone Encounter (Signed)
Reports she is taking 4 medication Losartan 50 mg daily, Eliquis 5 mg BID, Metoprolol 100 mg daily and one other she doesn't know the name of.  Advised her medical record was marked she was no longer taking Diltiazem by her PCP 3/13.  Advised in order for [place] to be able to refill we need to know she has been taking the medication without interruption.  Pt is going to continue to look thru her medication bottles and call back once she knows whether she has been taking it or not.

## 2018-01-18 NOTE — Telephone Encounter (Signed)
Spoke with patient who reports she

## 2018-01-22 NOTE — Telephone Encounter (Signed)
New Message:       Carol [name] is calling back from a missed call on last week.

## 2018-01-22 NOTE — Telephone Encounter (Signed)
Spoke with patient who has not had a chance to review her bottles of medication.  I received a call from her daughter concerned about the matter of medication replacement (Diltiazem).  The patient will call us tomorrow once she has had time to review medications.

## 2018-01-23 ENCOUNTER — Telehealth: Payer: Self-pay | Admitting: Cardiology

## 2018-01-23 NOTE — Telephone Encounter (Signed)
Pt called wanting to know which pharmacy her medications were sent to. I explained to the pt where her medications were sent and pt stated that she would call back if she has not received them. I advised the pt that is she has any other problems, questions or concerns, to call the office. Pt verbalized understanding.

## 2018-02-11 ENCOUNTER — Other Ambulatory Visit: Payer: Self-pay | Admitting: Cardiology

## 2018-02-11 DIAGNOSIS — I4891 Unspecified atrial fibrillation: Secondary | ICD-10-CM

## 2018-02-12 ENCOUNTER — Other Ambulatory Visit: Payer: Self-pay | Admitting: Cardiology

## 2018-02-12 DIAGNOSIS — I4891 Unspecified atrial fibrillation: Secondary | ICD-10-CM

## 2018-02-12 NOTE — Telephone Encounter (Signed)
Pt is a 84 yr old female who saw Dr Marlou Porch 06/2017. Her last noted SCr  Was 0.74 04/20108 and 0.69 02/18. Weight was 70.3Kg on 11/15/17. Will refill Eliquis 5mg  BID until 06/2018 appt.

## 2018-03-13 IMAGING — CT CT HEAD W/O CM
3 series · 15 of 47 positions shown, 18 images · non-contrast
Comparison: None

CLINICAL DATA: Slurred speech, expressive aphasia, history stroke,
atrial fibrillation, paroxysmal supra ventricular tachycardia,
hypertension, breast cancer

EXAM:
CT HEAD WITHOUT CONTRAST
TECHNIQUE: Contiguous axial images were obtained from the base of the skull
through the vertex without intravenous contrast. Sagittal and
coronal MPR images reconstructed from axial data set.

[Series 2: head wo · axial · 0.43mm/px · z∈[-211,-81]mm · 9 of 32 slices shown, 12 images]
[im 3/32  brain]
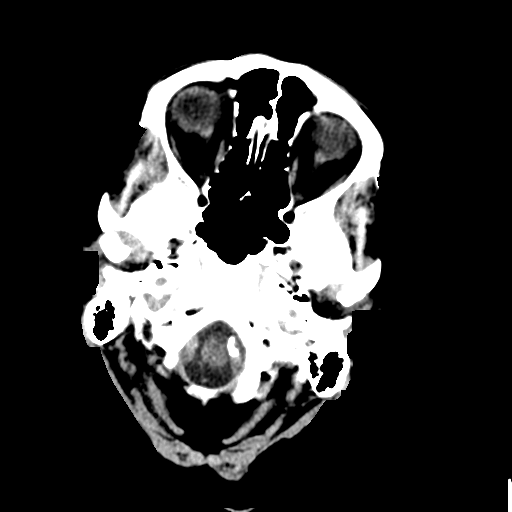
[im 3/32  bone]
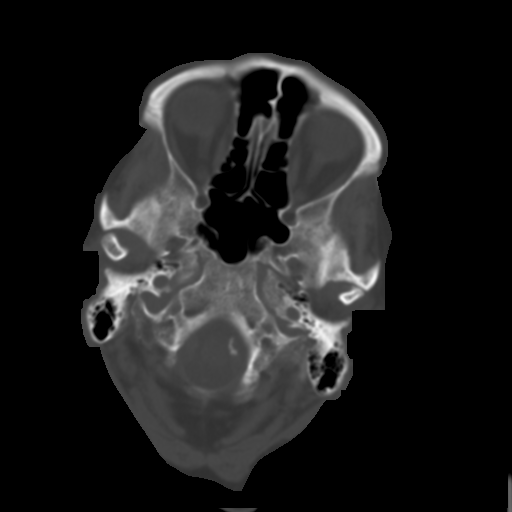
[im 6/32  brain]
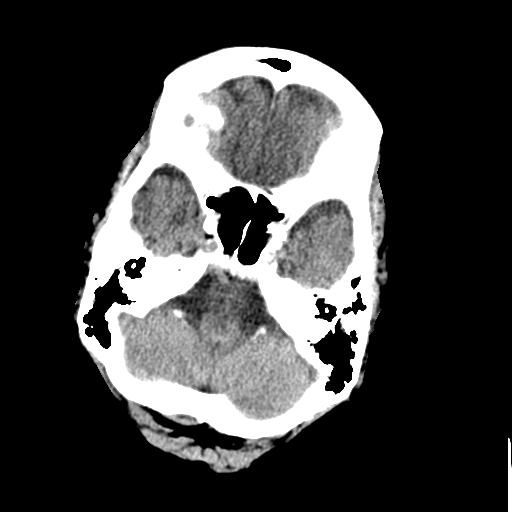
[im 9/32  brain]
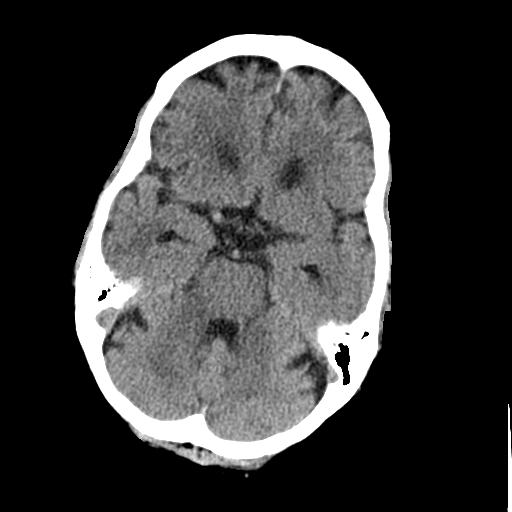
[im 12/32  brain]
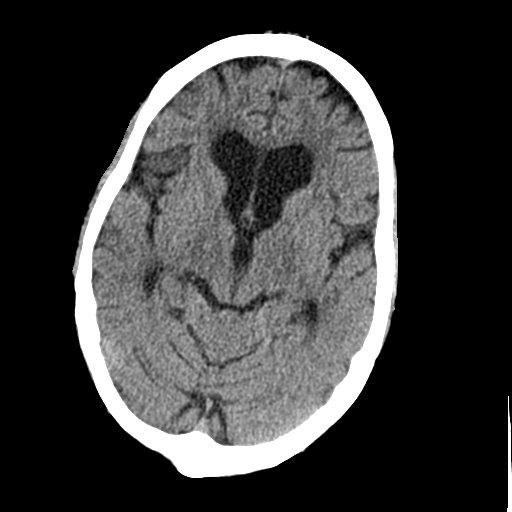
[im 17/32  brain]
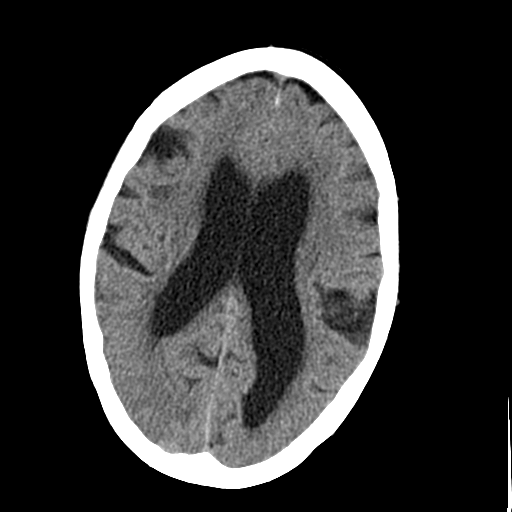
[im 17/32  bone]
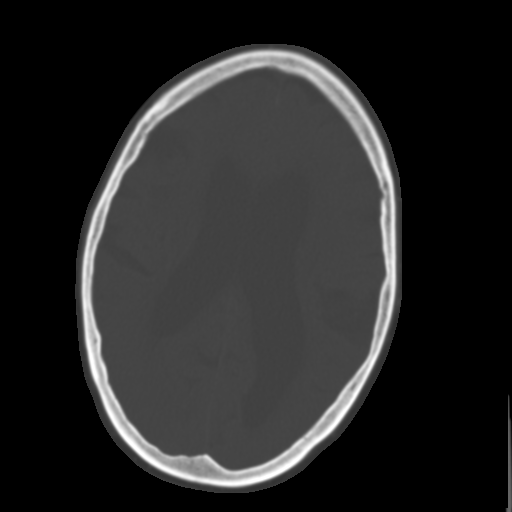
[im 20/32  brain]
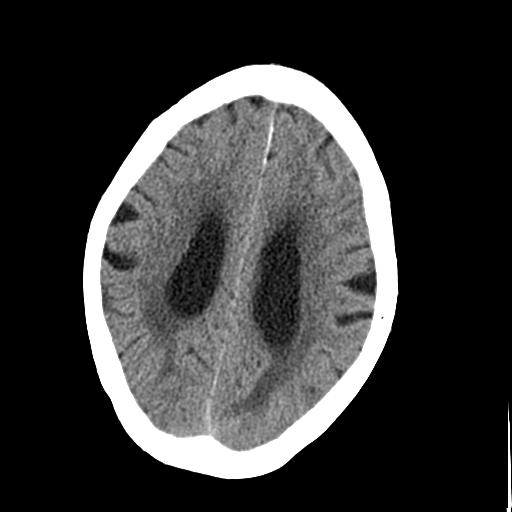
[im 23/32  brain]
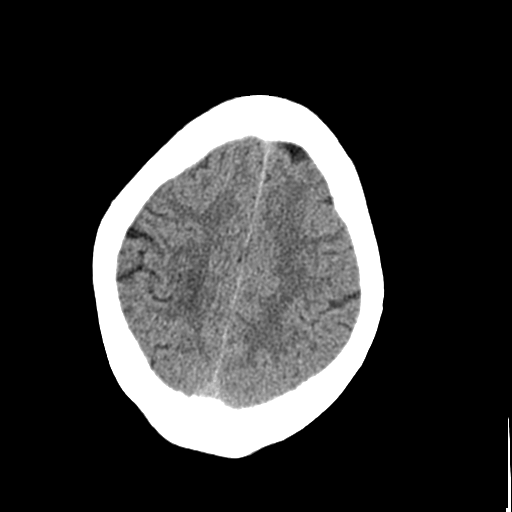
[im 26/32  brain]
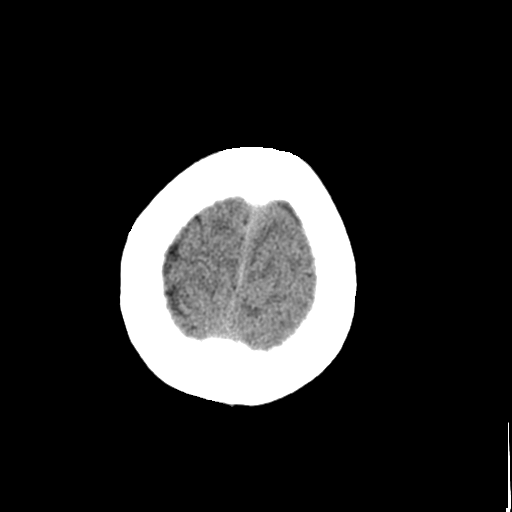
[im 29/32  brain]
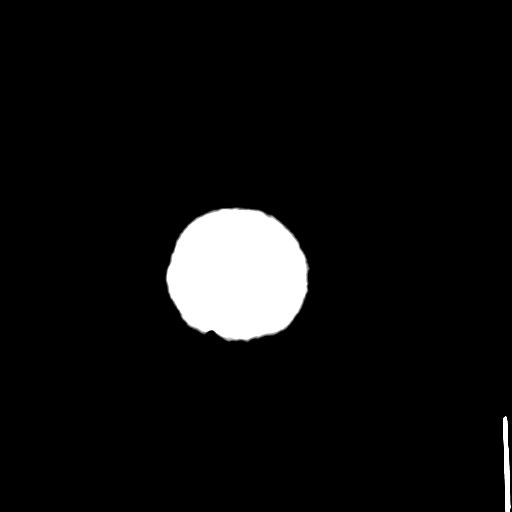
[im 29/32  bone]
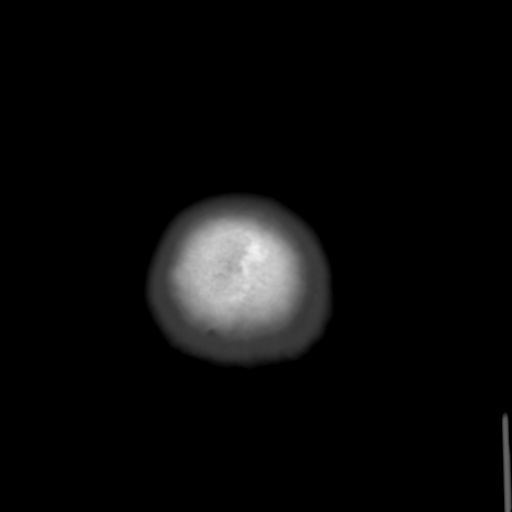

[Series 4: cor soft · coronal · 0.29mm/px · 3 of 72 slices shown]
[im 24/72  brain]
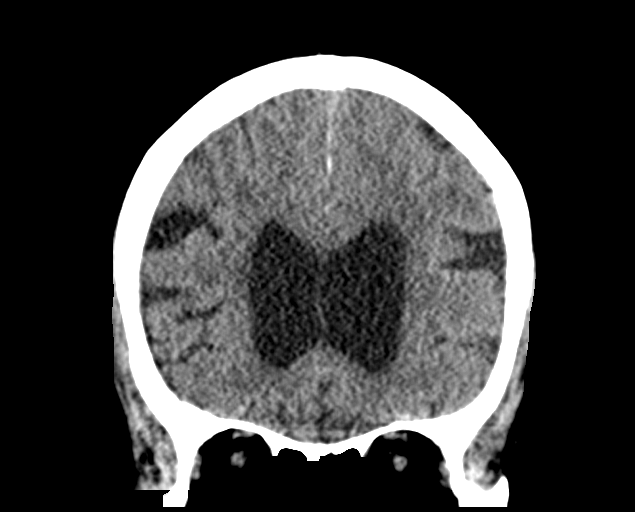
[im 32/72  brain]
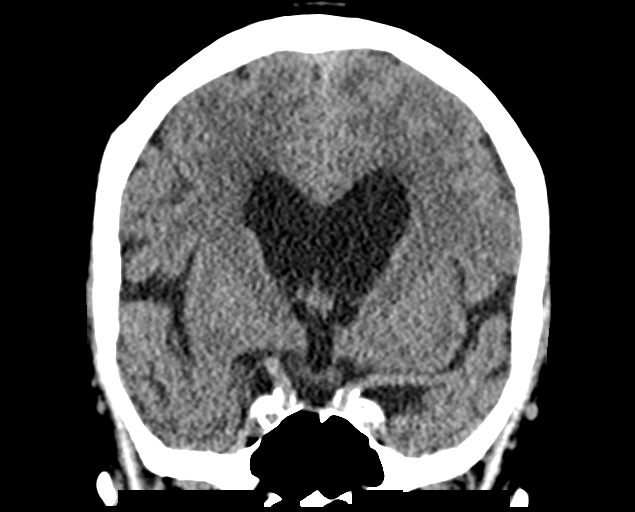
[im 40/72  brain]
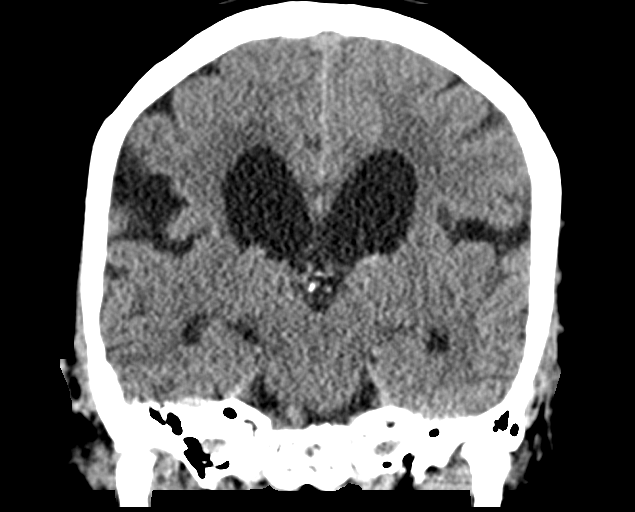

[Series 5: sag soft · sagittal · 0.30mm/px · 3 of 54 slices shown]
[im 18/54  brain]
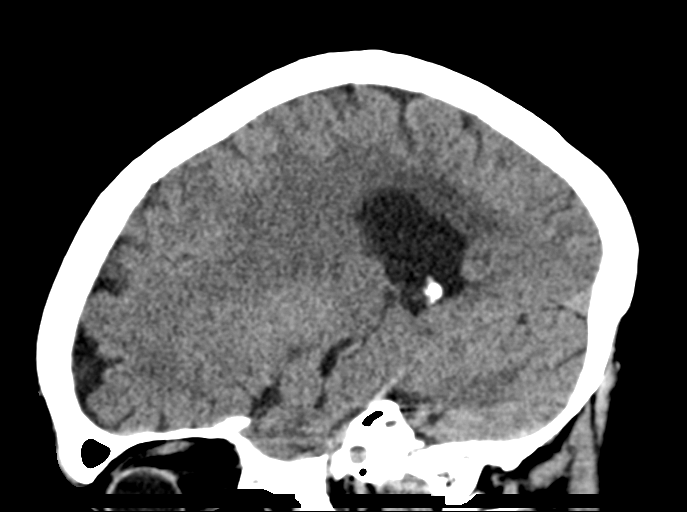
[im 27/54  brain]
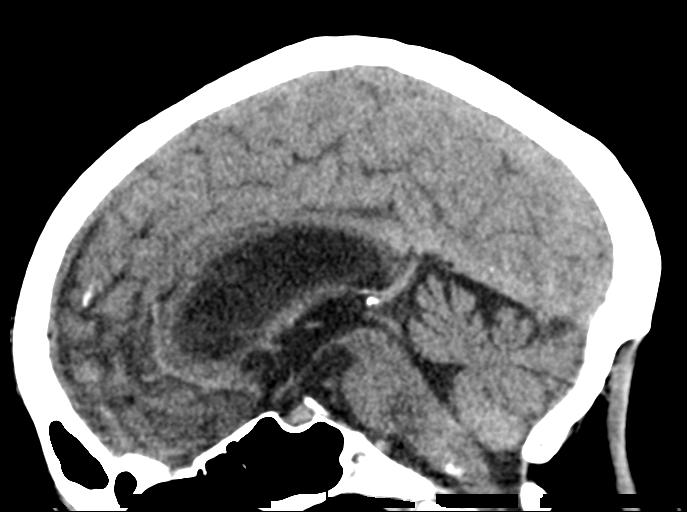
[im 36/54  brain]
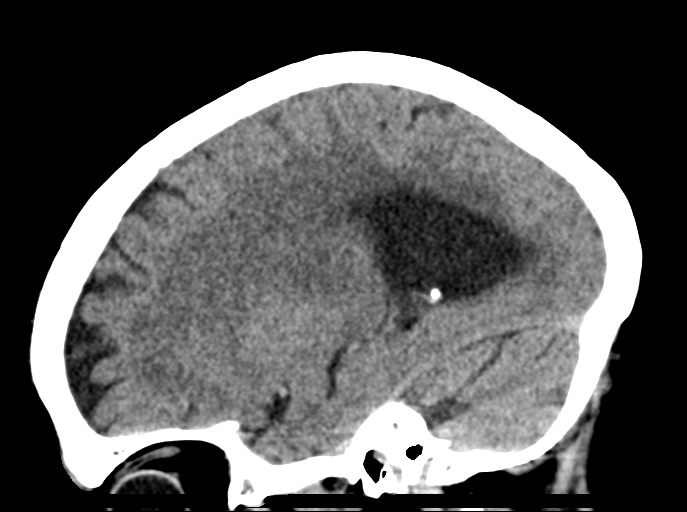

[15 of 47 positions shown; findings below may reference images not displayed]

FINDINGS: Brain: Generalized atrophy. Normal ventricular morphology. No
midline shift or mass effect. Small vessel chronic ischemic changes
of deep cerebral white matter. No intracranial hemorrhage, mass
lesion, evidence of acute infarction, or extra-axial fluid
collection.

Vascular: Significant atherosclerotic calcifications of internal
carotid and vertebral arteries at skullbase.

Skull: Intact

Sinuses/Orbits: Clear

Other: N/A
IMPRESSION: Atrophy with small vessel chronic ischemic changes of deep cerebral
white matter.

No acute intracranial abnormalities.

## 2018-03-23 NOTE — Progress Notes (Signed)
[place] at [facility] [address], [place], [place] [address] [phone]  Date:  03/25/2018   Name:  Carol [name]   DOB:  [birth year]   MRN:  [id number]  PCP:  Carol Mclean, MD    Chief Complaint: Epistaxis (recent bad nose bleeds, 1 month) and Trouble Walking (right knee/leg [name] making it difficult to walk)   History of Present Illness:  Carol [name] is a 84 y.o. very pleasant female patient who presents with the following:  Here today with concern of nosebleeds.  I last saw her in March for knee [name] and referred her to see Dr. Barbaraann [name] for same History of chronic anticoagulation for her A fib and SVT.  She is taking eliquis  Dr. Marlou [name] is her cardiologist- from her last visit in his office in November:  1. Permanent atrial fibrillation ([place])   2. Chronic anticoagulation   3. Hyperlipidemia, unspecified hyperlipidemia type    PLAN:  In order of problems listed above:  Permanent atrial fibrillation  - Trying to improve rate control.  - Currently on metoprolol succinate 100 mg and Cardizem CD 120 mg once a day.  Doing very well. Chronic anticoagulation  - Eliquis 5 mg twice a day  - Chads-Vasc score- (2-age, 1-HTN, 2-CVA, 1 CHF) - 6  - Expressed the importance to her taking this medication for stroke prevention especially in the light of her prior mini stroke approximate 10 years ago. Went over risk score is above.  Doing very well, no bleeding episodes.  Stable. Cardiomyopathy   - Ejection fraction 50% up from 40% in May 2017. Asymptomatic, rate control. Prior stress test March 2015. Possibly from tachycardia. Prior stress test was reassuring. This was performed in 10/2013.  - Decided to use Bumex on an as-needed basis, 1 mg. She has not had any problems with fluid overload.  Doing very well, no changes.  No shortness of breath currently. Essential hypertension  - Losartan 50 mg daily.  Blood pressure is  excellent.  She is not having any dizziness.  Continue. Prior creatinine 0.7. Hemoglobin 14.9, ALT 22, TSH 2.1. Prior EKGs reviewed with A. fib RVR and nonspecific ST-T wave changes. One year follow-up with me.  Can offer a tetanus vaccine to her today- offered to pt but she declines today, she will come in for this if she gets hurt in any way   She noted 2 pretty severe nosebleeds and one less severe over the last month or so- these seemed to be spontaneous. NKI She was able to get them under control on her own at home. No trauma or any other cause that she can think of Bleeding was always from the right nare Last bleed was about 2 weeks ago now  She notes that 20 years ago while she was on chemo she might get nosebleeds but nothing more recently  No blood in urine or stool, no bruising   Also she has noted right knee [name]- this is painful when she first gets up especially.  Walking seems to loosen it up She did see Dr. Barbaraann [name] and got an injection which did help for a good bit of time - pt did not seem to remember having done this at first but then was reminded  We did x-rays of her right knee in March which were pretty benign  No swelling noted NKI that she can recall No falls She is able to walk ok  Patient Active Problem List   Diagnosis Date Noted  . Right knee [name] 11/16/2017  . Osteopenia 11/12/2016  . Mild cognitive disorder 10/10/2016  . Chronic anticoagulation 10/04/2016  . History of atrial fibrillation 10/04/2016  . Essential hypertension, benign 09/01/2016  . Paroxysmal supraventricular tachycardia ([place]) 09/01/2016  . Hyperlipidemia 09/01/2016    Past Medical History:  Diagnosis Date  . A-fib ([place])   . Anemia   . Arrhythmia   . Blood transfusion without reported diagnosis   . Breast cancer ([place])   . Cancer ([place])   . Cataract   . CHF (congestive heart failure) ([place])   . Dyslipidemia   . Hyperlipidemia   . Hypertension   . Paroxysmal supraventricular  tachycardia ([place])   . Stroke [facility])     Past Surgical History:  Procedure Laterality Date  . MASTECTOMY    . TONSILLECTOMY    . WRIST SURGERY      Social History   Tobacco Use  . Smoking status: Never Smoker  . Smokeless tobacco: Never Used  Substance Use Topics  . Alcohol use: No  . Drug use: No    Family History  Problem Relation Age of Onset  . Epilepsy Mother   . Stroke Father   . Heart disease Maternal Aunt     Allergies  Allergen Reactions  . Sertraline   . Zoloft [Sertraline Hcl]     headache    Medication list has been reviewed and updated.  Current Outpatient Medications on File Prior to Visit  Medication Sig Dispense Refill  . ELIQUIS 5 MG TABS tablet TAKE 1 TABLET BY MOUTH TWICE A DAY 60 tablet 5  . losartan (COZAAR) 50 MG tablet Take 1 tablet (50 mg total) by mouth daily. Please make appt with Dr. Marlou [name] for November for future refills. 1st attempt. 30 tablet 4  . metoprolol succinate (TOPROL-XL) 100 MG 24 hr tablet TAKE 1 TABLET (100 MG TOTAL) BY MOUTH DAILY. TAKE WITH OR IMMEDIATELY FOLLOWING A MEAL. 30 tablet 4   No current facility-administered medications on file prior to visit.     Review of Systems:  As per HPI- otherwise negative. No fever or chills No CP or SOB She has noted some difficulty hearing in loud places with background noise and is thinking of getting a hearing aid    Physical Examination: Vitals:   03/25/18 1312  BP: 118/70  Pulse: 87  Resp: 16  SpO2: 98%   Vitals:   03/25/18 1312  Weight: 151 lb (68.5 kg)  Height: 5\' 1"  (1.549 m)   Body mass index is 28.53 kg/m. Ideal Body Weight: Weight in (lb) to have BMI = 25: 132  GEN: WDWN, NAD, Non-toxic, A & O x 3, overweight, looks very well for age  20: Atraumatic, Normocephalic. Neck supple. No masses, No LAD.  Bilateral TM wnl, oropharynx normal.  PEERL,EOMI.   Right anterior nare displays a healing area which may be source of her recent bleeding  Ears and Nose: No  external deformity.     CV: RRR, No M/G/R. No JVD. No thrill. No extra heart sounds. PULM: CTA B, no wheezes, crackles, rhonchi. No retractions. No resp. distress. No accessory muscle use.Marland Kitchen EXTR: No c/c/e NEURO Normal gait.  PSYCH: Normally interactive. Conversant. Not depressed or anxious appearing.  Calm demeanor.  Right knee: no effusion, redness or warmth She notes mild medial joint line tenderness to pressure Normal ROM  Assessment and Plan: Chronic [name] of right knee  Atrial fibrillation, unspecified  type ([place]) - Plan: apixaban (ELIQUIS) 5 MG TABS tablet, losartan (COZAAR) 50 MG tablet, metoprolol succinate (TOPROL-XL) 100 MG 24 hr tablet  Frequent nosebleeds  Nosebleeds- seems to have resolved.  This is likely related to eilquis use. Discussed keeping her nose moisturized well with vaseline She will let me know if this comes back Continue meds as above for her a fib Knee [name]- not all that bothersome to pt, she just wanted to mention it.  Discussed ice, supportive knee sleeve. She will let me know if she needs anything further for this   Signed Lamar Blinks, MD

## 2018-03-25 ENCOUNTER — Ambulatory Visit (INDEPENDENT_AMBULATORY_CARE_PROVIDER_SITE_OTHER): Payer: Medicare HMO | Admitting: Family Medicine

## 2018-03-25 ENCOUNTER — Encounter: Payer: Self-pay | Admitting: Family Medicine

## 2018-03-25 VITALS — BP 118/70 | HR 87 | Resp 16 | Ht 61.0 in | Wt 151.0 lb

## 2018-03-25 DIAGNOSIS — M25561 Pain in right knee: Secondary | ICD-10-CM | POA: Diagnosis not present

## 2018-03-25 DIAGNOSIS — I4891 Unspecified atrial fibrillation: Secondary | ICD-10-CM | POA: Diagnosis not present

## 2018-03-25 DIAGNOSIS — G8929 Other chronic pain: Secondary | ICD-10-CM

## 2018-03-25 DIAGNOSIS — R040 Epistaxis: Secondary | ICD-10-CM | POA: Diagnosis not present

## 2018-03-25 MED ORDER — APIXABAN 5 MG PO TABS: 5.0000 mg | ORAL_TABLET | Freq: Two times a day (BID) | ORAL | 5 refills | Status: DC

## 2018-03-25 MED ORDER — LOSARTAN POTASSIUM 50 MG PO TABS: 50.0000 mg | ORAL_TABLET | Freq: Every day | ORAL | 4 refills | Status: DC

## 2018-03-25 MED ORDER — METOPROLOL SUCCINATE ER 100 MG PO TB24: 100.0000 mg | ORAL_TABLET | Freq: Every day | ORAL | 4 refills | Status: DC

## 2018-03-25 MED FILL — ELIQUIS 5 MG TABLET: 5 | 30 days supply | Qty: 60 | Fill #0

## 2018-03-25 MED FILL — METOPROLOL SUCCINATE ER 100: 100 | 30 days supply | Qty: 30 | Fill #0

## 2018-03-25 NOTE — Patient Instructions (Signed)
Your nosebleeds likely came from a superficial blood vessel in your nose- please put a little vaseline in your nose for a few days so it will stay moist and heal. If your bleeding returns please contact me  You may want to apply ice to your right knee for 10- 20 minutes a few times a day.  If not helpful we can have you see Dr. Barbaraann Barthel and do another injection which did seem to help the last time  Take care and please see me in 4-6 months

## 2018-04-23 DIAGNOSIS — R040 Epistaxis: Secondary | ICD-10-CM | POA: Diagnosis not present

## 2018-07-25 NOTE — Progress Notes (Deleted)
[place] at [facility] [address], [place], [place] [address] [phone]  Date:  07/29/2018   Name:  Carol [name]   DOB:  Sep 17, 1927   MRN:  [id number]  PCP:  Darreld Mclean, MD    Chief Complaint: No chief complaint on file.   History of Present Illness:  Carol [name] is a 84 y.o. very pleasant female patient who presents with the following:  Routine follow-up visit today History of A fib, HTN, hyperlipidemia, chronic anticoagulation with eliquis Last seen in July at which time she was bothered by nosebleeds  Flu shot:  Due for routine labs today   Patient Active Problem List   Diagnosis Date Noted  . Right knee pain 11/16/2017  . Osteopenia 11/12/2016  . Mild cognitive disorder 10/10/2016  . Chronic anticoagulation 10/04/2016  . History of atrial fibrillation 10/04/2016  . Essential hypertension, benign 09/01/2016  . Paroxysmal supraventricular tachycardia ([place]) 09/01/2016  . Hyperlipidemia 09/01/2016    Past Medical History:  Diagnosis Date  . A-fib ([place])   . Anemia   . Arrhythmia   . Blood transfusion without reported diagnosis   . Breast cancer ([place])   . Cancer ([place])   . Cataract   . CHF (congestive heart failure) ([place])   . Dyslipidemia   . Hyperlipidemia   . Hypertension   . Paroxysmal supraventricular tachycardia ([place])   . Stroke [facility])     Past Surgical History:  Procedure Laterality Date  . MASTECTOMY    . TONSILLECTOMY    . WRIST SURGERY      Social History   Tobacco Use  . Smoking status: Never Smoker  . Smokeless tobacco: Never Used  Substance Use Topics  . Alcohol use: No  . Drug use: No    Family History  Problem Relation Age of Onset  . Epilepsy Mother   . Stroke Father   . Heart disease Maternal Aunt     Allergies  Allergen Reactions  . Sertraline   . Zoloft [Sertraline Hcl]     headache    Medication list has been reviewed and updated.  Current Outpatient  Medications on File Prior to Visit  Medication Sig Dispense Refill  . apixaban (ELIQUIS) 5 MG TABS tablet Take 1 tablet (5 mg total) by mouth 2 (two) times daily. 60 tablet 5  . losartan (COZAAR) 50 MG tablet Take 1 tablet (50 mg total) by mouth daily. Please make appt with Dr. Marlou Porch for November for future refills. 1st attempt. 30 tablet 4  . metoprolol succinate (TOPROL-XL) 100 MG 24 hr tablet Take 1 tablet (100 mg total) by mouth daily. Take with or immediately following a meal. 30 tablet 4   No current facility-administered medications on file prior to visit.     Review of Systems:  As per HPI- otherwise negative.'  Physical Examination: There were no vitals filed for this visit. There were no vitals filed for this visit. There is no height or weight on file to calculate BMI. Ideal Body Weight:    GEN: WDWN, NAD, Non-toxic, A & O x 3 HEENT: Atraumatic, Normocephalic. Neck supple. No masses, No LAD. Ears and Nose: No external deformity. CV: RRR, No M/G/R. No JVD. No thrill. No extra heart sounds. PULM: CTA B, no wheezes, crackles, rhonchi. No retractions. No resp. distress. No accessory muscle use. ABD: S, NT, ND, +BS. No rebound. No HSM. EXTR: No c/c/e NEURO Normal gait.  PSYCH: Normally interactive.  Conversant. Not depressed or anxious appearing.  Calm demeanor.    Assessment and Plan: ***  Signed Lamar Blinks, MD

## 2018-07-29 ENCOUNTER — Ambulatory Visit: Payer: Medicare HMO | Admitting: Family Medicine

## 2018-07-29 DIAGNOSIS — Z0289 Encounter for other administrative examinations: Secondary | ICD-10-CM

## 2018-08-08 ENCOUNTER — Encounter: Payer: Medicare HMO | Admitting: Family Medicine

## 2018-08-12 ENCOUNTER — Telehealth: Payer: Self-pay | Admitting: Cardiology

## 2018-08-12 NOTE — Telephone Encounter (Signed)
Spoke to patient's sister-in-law [name]) who wanted Dr Marlou Porch to be aware that the patient has quit taking her medications and has been experiencing memory loss.    According to her [facility], (per family) she has not filled medication since 09/2016.  The patient will probably be accompanied by her brother Delsa Bern) on 12/17 OV.  The family wanted Dr Marlou Porch to be aware prior to visit.

## 2018-08-12 NOTE — Telephone Encounter (Signed)
New message   Per [name], patient's sister in law, she wants Dr. Marlou Porch to know that she has not been taking any of her medications and is having memory loss also. [name] wants you to know this for informational  purposes. Patient has an appt on tomorrow.

## 2018-08-13 ENCOUNTER — Encounter: Payer: Self-pay | Admitting: Cardiology

## 2018-08-13 ENCOUNTER — Ambulatory Visit: Payer: Medicare HMO | Admitting: Cardiology

## 2018-08-13 VITALS — BP 154/86 | HR 82 | Ht 61.0 in | Wt 146.1 lb

## 2018-08-13 DIAGNOSIS — I10 Essential (primary) hypertension: Secondary | ICD-10-CM

## 2018-08-13 DIAGNOSIS — E785 Hyperlipidemia, unspecified: Secondary | ICD-10-CM

## 2018-08-13 DIAGNOSIS — I4821 Permanent atrial fibrillation: Secondary | ICD-10-CM

## 2018-08-13 DIAGNOSIS — I480 Paroxysmal atrial fibrillation: Secondary | ICD-10-CM

## 2018-08-13 DIAGNOSIS — Z7901 Long term (current) use of anticoagulants: Secondary | ICD-10-CM

## 2018-08-13 DIAGNOSIS — I4891 Unspecified atrial fibrillation: Secondary | ICD-10-CM

## 2018-08-13 DIAGNOSIS — I420 Dilated cardiomyopathy: Secondary | ICD-10-CM

## 2018-08-13 MED ORDER — APIXABAN 5 MG PO TABS: 5.0000 mg | ORAL_TABLET | Freq: Two times a day (BID) | ORAL | 3 refills | Status: DC

## 2018-08-13 MED ORDER — LOSARTAN POTASSIUM 50 MG PO TABS: 50.0000 mg | ORAL_TABLET | Freq: Every day | ORAL | 3 refills | Status: DC

## 2018-08-13 NOTE — Progress Notes (Signed)
Cardiology Office Note:    Date:  08/13/2018   ID:  Carol [name], DOB [birth year], MRN [id number]  PCP:  Carol Mclean, MD  Cardiologist:  No primary care provider on file.  Electrophysiologist:  None   Referring MD: Carol Mclean, MD     History of Present Illness:    Carol [name] is a 84 y.o. female here for follow-up of paroxysmal atrial fibrillation.  Mildly reduced ejection fraction of approximately 50%.  History of stroke, breast cancer, neurology.  Positive hypertension, no diabetes, no peripheral vascular disease.  No prior MIs.  She admitted that she had not been taking her 3 medications Eliquis, Toprol, losartan.  No bleeding syncope orthopnea PND.  No new strokelike symptoms.  Interestingly, today she is in normal sinus rhythm 82.  She is here today with her brother and his wife.  She is also worried about some memory issues that she may be having.  Denies any fevers chills nausea vomiting syncope bleeding  Past Medical History:  Diagnosis Date  . A-fib ([place])   . Anemia   . Arrhythmia   . Blood transfusion without reported diagnosis   . Breast cancer ([place])   . Cancer ([place])   . Cataract   . CHF (congestive heart failure) ([place])   . Dyslipidemia   . Hyperlipidemia   . Hypertension   . Paroxysmal supraventricular tachycardia ([place])   . Stroke [facility])     Past Surgical History:  Procedure Laterality Date  . MASTECTOMY    . TONSILLECTOMY    . WRIST SURGERY      Current Medications: No outpatient medications have been marked as taking for the 08/13/18 encounter (Office Visit) with Jerline Pain, MD.     Allergies:   Sertraline and Zoloft [facility] hcl]   Social History   Socioeconomic History  . Marital status: Widowed    Spouse name: Not on file  . Number of children: 2  . Years of education: 2 years college  . Highest education level: Not on file  Occupational History  . Occupation: Retired  Scientific laboratory technician  . Financial resource strain:  Not on file  . Food insecurity:    Worry: Not on file    Inability: Not on file  . Transportation needs:    Medical: Not on file    Non-medical: Not on file  Tobacco Use  . Smoking status: Never Smoker  . Smokeless tobacco: Never Used  Substance and Sexual Activity  . Alcohol use: No  . Drug use: No  . Sexual activity: Not on file  Lifestyle  . Physical activity:    Days per week: Not on file    Minutes per session: Not on file  . Stress: Not on file  Relationships  . Social connections:    Talks on phone: Not on file    Gets together: Not on file    Attends religious service: Not on file    Active member of club or organization: Not on file    Attends meetings of clubs or organizations: Not on file    Relationship status: Not on file  Other Topics Concern  . Not on file  Social History Narrative   Lives in a retirement community.   Right-handed.   Only drinks caffeine some days.     Family History: The patient's family history includes Epilepsy in her mother; Heart disease in her maternal aunt; Stroke in her father.  ROS:   Please see the history of  present illness.    Denies any fevers chills nausea vomiting syncope bleeding all other systems reviewed and are negative.  EKGs/Labs/Other Studies Reviewed:    The following studies were reviewed today: Prior office notes EKG lab work  EKG:  EKG is  ordered today.  The ekg ordered today demonstrates sinus rhythm 82 with no other abnormalities detected.  No atrial fibrillation.  Personally reviewed and detected  Recent Labs: No results found for requested labs within last 8760 hours.  Recent Lipid Panel    Component Value Date/Time   CHOL 182 09/12/2016 0943   TRIG 72 09/12/2016 0943   HDL 81 09/12/2016 0943   CHOLHDL 2.2 09/12/2016 0943   LDLCALC 87 09/12/2016 0943    Physical Exam:    VS:  BP (!) 154/86   Pulse 82   Ht 5\' 1"  (1.549 m)   Wt 146 lb 1.9 oz (66.3 kg)   LMP  (LMP Unknown)   BMI 27.61 kg/m      Wt Readings from Last 3 Encounters:  08/13/18 146 lb 1.9 oz (66.3 kg)  03/25/18 151 lb (68.5 kg)  11/15/17 155 lb (70.3 kg)     GEN:  Well nourished, well developed in no acute distress HEENT: Normal NECK: No JVD; No carotid bruits LYMPHATICS: No lymphadenopathy CARDIAC: RRR, no murmurs, rubs, gallops RESPIRATORY:  Clear to auscultation without rales, wheezing or rhonchi  ABDOMEN: Soft, non-tender, non-distended MUSCULOSKELETAL:  No edema; No deformity  SKIN: Warm and dry NEUROLOGIC:  Alert and oriented x 3 PSYCHIATRIC:  Normal affect   ASSESSMENT:    1. Paroxysmal atrial fibrillation ([facility])   2. Permanent atrial fibrillation   3. Chronic anticoagulation   4. Essential hypertension   5. Hyperlipidemia, unspecified hyperlipidemia type   6. Congestive dilated cardiomyopathy ([place])   7. Atrial fibrillation, unspecified type ([place])    PLAN:    In order of problems listed above:  Paroxysmal atrial fibrillation - At last clinic visit on 07/24/2017 we were trying to improve rate control.  Her ECG showed heart rate of 122 bpm.  Currently however she is in sinus rhythm rate 82 bpm.  She used to be on Toprol XL 100 mg a day.  For now, we will hold off on this medication.  Restarting losartan only.  If we need to, we can always resume the Toprol.  Chronic anticoagulation -CHADSVASc score is 6 2 for age 50 for hypertension 2 for stroke 1 for heart failure.  Restarting  Eliquis 5 mg twice a day.  Creatinine and weight qualify her for this increased dose.  Expressed the importance of taking this medication in case atrial fibrillation were to return.  We want to reduce the risk of stroke.  Cardiomyopathy - Ejection fraction now 50% up from 40% in May 2017.  Doing quite well.  Prior stress test in March 2015 overall reassuring.  This reduction in EF may have been from tachycardia induced cardiomyopathy.    Essential hypertension - We will get her back on losartan, to improve blood  pressure control.  Blood pressure today 154/86    Medication Adjustments/Labs and Tests Ordered: Current medicines are reviewed at length with the patient today.  Concerns regarding medicines are outlined above.  Orders Placed This Encounter  Procedures  . EKG 12-Lead   Meds ordered this encounter  Medications  . losartan (COZAAR) 50 MG tablet    Sig: Take 1 tablet (50 mg total) by mouth daily.    Dispense:  90 tablet  Refill:  3  . apixaban (ELIQUIS) 5 MG TABS tablet    Sig: Take 1 tablet (5 mg total) by mouth 2 (two) times daily.    Dispense:  180 tablet    Refill:  3    Patient Instructions  Medication Instructions:  1) START ELIQUIS 5 mg TWICE DAILY 2) RESTART LOSARTAN 50 mg daily  Labwork: None  Testing/Procedures: None  Follow-Up: You have an appointment with [name] on February 10, 2019 at 2:30PM.  Your provider wants you to follow-up in: 1 year with Dr. Marlou Porch. You will receive a reminder letter in the mail two months in advance. If you don't receive a letter, please call our office to schedule the follow-up appointment.      Signed, Candee Furbish, MD  08/13/2018 2:24 PM    [place]

## 2018-08-13 NOTE — Patient Instructions (Addendum)
Medication Instructions:  1) START ELIQUIS 5 mg TWICE DAILY 2) RESTART LOSARTAN 50 mg daily  Labwork: None  Testing/Procedures: None  Follow-Up: You have an appointment with [name] on February 10, 2019 at 2:30PM.  Your provider wants you to follow-up in: 1 year with Dr. Marlou Porch. You will receive a reminder letter in the mail two months in advance. If you don't receive a letter, please call our office to schedule the follow-up appointment.

## 2018-08-18 NOTE — Progress Notes (Addendum)
[facility] [place], [place], [place] [address] [phone]  Date:  08/19/2018   Name:  Carol [name]   DOB:  1927-09-01   MRN:  [id number]  PCP:  Darreld Mclean, MD    Chief Complaint: Annual Exam   History of Present Illness:  Carol [name] is a 84 y.o. very pleasant female patient who presents with the following:  Physical exam today History of A fib on eliquis, HTN, hyperlipidemia, breast cancer 20 years ago, stroke years ago  Last seen here in July  Flu shot: done at drug store 2 weeks ago  Tetanus: no recent wound  Shingrix: she will plan to get at the drug store at her convenience  dexa 3/18- can order for next year Mammo: she is s/p bilateral mastectomy  Labs: 2/18, due She is not fasting today   Recent visit with her cardiologist Dr. Marlou Porch:  1. Paroxysmal atrial fibrillation (HCC)   2. Permanent atrial fibrillation   3. Chronic anticoagulation   4. Essential hypertension   5. Hyperlipidemia, unspecified hyperlipidemia type   6. Congestive dilated cardiomyopathy ([place])   7. Atrial fibrillation, unspecified type ([place])    PLAN:    In order of problems listed above: Paroxysmal atrial fibrillation - At last clinic visit on 07/24/2017 we were trying to improve rate control.  Her ECG showed heart rate of 122 bpm.  Currently however she is in sinus rhythm rate 82 bpm.  She used to be on Toprol XL 100 mg a day.  For now, we will hold off on this medication.  Restarting losartan only.  If we need to, we can always resume the Toprol. Chronic anticoagulation -CHADSVASc score is 6 2 for age 44 for hypertension 2 for stroke 1 for heart failure.  Restarting  Eliquis 5 mg twice a day.  Creatinine and weight qualify her for this increased dose.  Expressed the importance of taking this medication in case atrial fibrillation were to return.  We want to reduce the risk of stroke. Cardiomyopathy - Ejection  fraction now 50% up from 40% in May 2017.  Doing quite well.  Prior stress test in March 2015 overall reassuring.  This reduction in EF may have been from tachycardia induced cardiomyopathy.   Essential hypertension - We will get her back on losartan, to improve blood pressure control.  Blood pressure today 154/86  Here today with her SIL who contributes to the history today She does drive but not a whole lot, she sticks to her local area. Estimates that she drove 2 or 3 years ago  No falls in the past year Her mood has been ok   She has family nearby, her brother and SIL are very involved and keep an eye on her Her son lives in [place]  The patient and her family do have some concerns about her memory.  This is not entirely new, but has become a bit worse over the last several months.  She may misplace items such as her water checkbook.  She drives a small amount on familiar local routes, and has not gotten lost. She has not is forgotten her medications, including her blood thinner Carol [name] did see Dr. Krista Blue in 2018, and had an MRI of her brain.  The MRI was more or less normal for age, and they did not start a medication at that time. I will send Dr. Krista Blue a message, asking if she would  suggest any medication at this time.  She may also want Carol [name] to come in for a follow-up visit Carol [name] lives at an independent living facility, which does provide meals and transportation.  She has a call button to use in case of falls  Her eye exam is up-to-date. Carol [name] has noticed some problems with her hearing, and would like to have a hearing evaluation and hearing aids if needed I encouraged her to have a hearing exam, and to use hearing aids if needed. Also suggested a pillbox to help her keep track of her daily medications Patient Active Problem List   Diagnosis Date Noted  . Paroxysmal atrial fibrillation ([place]) 08/13/2018  . Right knee pain 11/16/2017  . Osteopenia 11/12/2016  . Mild cognitive disorder  10/10/2016  . Chronic anticoagulation 10/04/2016  . History of atrial fibrillation 10/04/2016  . Essential hypertension, benign 09/01/2016  . Paroxysmal supraventricular tachycardia ([place]) 09/01/2016  . Hyperlipidemia 09/01/2016    Past Medical History:  Diagnosis Date  . A-fib ([place])   . Anemia   . Arrhythmia   . Blood transfusion without reported diagnosis   . Breast cancer ([place])   . Cancer ([place])   . Cataract   . CHF (congestive heart failure) ([place])   . Dyslipidemia   . Hyperlipidemia   . Hypertension   . Paroxysmal supraventricular tachycardia ([place])   . Stroke [facility])     Past Surgical History:  Procedure Laterality Date  . MASTECTOMY    . TONSILLECTOMY    . WRIST SURGERY      Social History   Tobacco Use  . Smoking status: Never Smoker  . Smokeless tobacco: Never Used  Substance Use Topics  . Alcohol use: No  . Drug use: No    Family History  Problem Relation Age of Onset  . Epilepsy Mother   . Stroke Father   . Heart disease Maternal Aunt     Allergies  Allergen Reactions  . Sertraline Other (See Comments)  . Zoloft [Sertraline Hcl]     headache    Medication list has been reviewed and updated.  Current Outpatient Medications on File Prior to Visit  Medication Sig Dispense Refill  . apixaban (ELIQUIS) 5 MG TABS tablet Take 1 tablet (5 mg total) by mouth 2 (two) times daily. 180 tablet 3  . losartan (COZAAR) 50 MG tablet Take 1 tablet (50 mg total) by mouth daily. 90 tablet 3   No current facility-administered medications on file prior to visit.     Review of Systems:  As per HPI- otherwise negative.   Physical Examination: Vitals:   08/19/18 1304  BP: 136/70  Pulse: 82  Resp: 16  SpO2: 97%   Vitals:   08/19/18 1304  Weight: 149 lb (67.6 kg)  Height: 5\' 1"  (1.549 m)   Body mass index is 28.15 kg/m. Ideal Body Weight: Weight in (lb) to have BMI = 25: 132  GEN: WDWN, NAD, Non-toxic, A & O x 3 HEENT: Atraumatic, Normocephalic. Neck  supple. No masses, No LAD. Ears and Nose: No external deformity. CV: RRR, No M/G/R. No JVD. No thrill. No extra heart sounds. PULM: CTA B, no wheezes, crackles, rhonchi. No retractions. No resp. distress. No accessory muscle use. ABD: S, NT, ND, +BS. No rebound. No HSM. EXTR: No c/c/e NEURO Normal gait.  PSYCH: Normally interactive. Conversant. Not depressed or anxious appearing.  Calm demeanor.    Assessment and Plan: Physical exam  Atrial fibrillation, unspecified type ([place]) - Plan: Comprehensive metabolic  panel  History of osteopenia - Plan: DG Bone Density  Essential hypertension, benign - Plan: CBC, Comprehensive metabolic panel  Hyperlipidemia, unspecified hyperlipidemia type - Plan: Lipid panel  Chronic anticoagulation - Plan: CBC  Screening for diabetes mellitus - Plan: Hemoglobin A1c  Estrogen deficiency - Plan: DG Bone Density  Bilateral hearing loss, unspecified hearing loss type  Mild cognitive disorder  Here today for complete physical exam, she is accompanied by her sister-in-law who assist with a history. Family and patient are somewhat concerned about her memory, she has had mild memory loss for a few years but it seems to getting somewhat worse. She has seen Dr. Krista Blue in the past, and I have sent her a message about this patient. I was not sure if she want to see the patient back, or if she might suggest I start any sort of medication. Labs plan as above. She is awaiting her mail order supply of Eliquis, I did give her a week supply to use for now. Ordered bone density for the spring Order dexa for 10/2018 She is out of eliquis, I gave her a one week sample pack  Signed Lamar Blinks, MD  Received her labs 12/26.  Results for orders placed or performed in visit on 08/19/18  CBC  Result Value Ref Range   WBC 6.1 4.0 - 10.5 K/uL   RBC 4.57 3.87 - 5.11 Mil/uL   Platelets 252.0 150.0 - 400.0 K/uL   Hemoglobin 13.8 12.0 - 15.0 g/dL   HCT 41.8 36.0 - 46.0  %   MCV 91.5 78.0 - 100.0 fl   MCHC 33.0 30.0 - 36.0 g/dL   RDW 13.9 11.5 - 15.5 %  Comprehensive metabolic panel  Result Value Ref Range   Sodium 138 135 - 145 mEq/L   Potassium 4.9 3.5 - 5.1 mEq/L   Chloride 102 96 - 112 mEq/L   CO2 28 19 - 32 mEq/L   Glucose, Bld 91 70 - 99 mg/dL   BUN 13 6 - 23 mg/dL   Creatinine, Ser 0.78 0.40 - 1.20 mg/dL   Total Bilirubin 0.4 0.2 - 1.2 mg/dL   Alkaline Phosphatase 51 39 - 117 U/L   AST 19 0 - 37 U/L   ALT 12 0 - 35 U/L   Total Protein 6.9 6.0 - 8.3 g/dL   Albumin 4.2 3.5 - 5.2 g/dL   Calcium 9.9 8.4 - 10.5 mg/dL   GFR 74.05 >60.00 mL/min  Hemoglobin A1c  Result Value Ref Range   Hgb A1c MFr Bld 5.6 4.6 - 6.5 %  Lipid panel  Result Value Ref Range   Cholesterol 203 (H) 0 - 200 mg/dL   Triglycerides 100.0 0.0 - 149.0 mg/dL   HDL 69.10 >39.00 mg/dL   VLDL 20.0 0.0 - 40.0 mg/dL   LDL Cholesterol 114 (H) 0 - 99 mg/dL   Total CHOL/HDL Ratio 3    NonHDL 133.53    Also received a reply message from her neurologist  Jessica:  Namenda 10mg  bid or Aricept 10mg  qhs would be fine.  I will ask my office to call her for a follow up appointment.  Happy Holidays   Called Fayetta to go over her labs. Let her know that they look ok She is amenable to trying a medication for her memory  On the phone her memory is obviously worse than I had realized.  I called [name] to go over my concerns and she is already very aware.  Reneka must have worked  hard to put on her "best face" for me at our visit I will send in an rx for aricept 5 for her.  I will contact Dr. Krista Blue and let her know that communications need to go through [facility]

## 2018-08-19 ENCOUNTER — Encounter: Payer: Self-pay | Admitting: Family Medicine

## 2018-08-19 ENCOUNTER — Ambulatory Visit (INDEPENDENT_AMBULATORY_CARE_PROVIDER_SITE_OTHER): Payer: Medicare HMO | Admitting: Family Medicine

## 2018-08-19 ENCOUNTER — Telehealth: Payer: Self-pay | Admitting: Neurology

## 2018-08-19 VITALS — BP 136/70 | HR 82 | Resp 16 | Ht 61.0 in | Wt 149.0 lb

## 2018-08-19 DIAGNOSIS — Z8739 Personal history of other diseases of the musculoskeletal system and connective tissue: Secondary | ICD-10-CM | POA: Diagnosis not present

## 2018-08-19 DIAGNOSIS — I10 Essential (primary) hypertension: Secondary | ICD-10-CM

## 2018-08-19 DIAGNOSIS — Z0000 Encounter for general adult medical examination without abnormal findings: Secondary | ICD-10-CM | POA: Diagnosis not present

## 2018-08-19 DIAGNOSIS — Z131 Encounter for screening for diabetes mellitus: Secondary | ICD-10-CM

## 2018-08-19 DIAGNOSIS — E785 Hyperlipidemia, unspecified: Secondary | ICD-10-CM | POA: Diagnosis not present

## 2018-08-19 DIAGNOSIS — Z7901 Long term (current) use of anticoagulants: Secondary | ICD-10-CM | POA: Diagnosis not present

## 2018-08-19 DIAGNOSIS — H9193 Unspecified hearing loss, bilateral: Secondary | ICD-10-CM

## 2018-08-19 DIAGNOSIS — E2839 Other primary ovarian failure: Secondary | ICD-10-CM

## 2018-08-19 DIAGNOSIS — I4891 Unspecified atrial fibrillation: Secondary | ICD-10-CM

## 2018-08-19 DIAGNOSIS — F09 Unspecified mental disorder due to known physiological condition: Secondary | ICD-10-CM

## 2018-08-19 NOTE — Patient Instructions (Addendum)
It was a pleasure to see you today as always, I hope that you have a wonderful holiday season. I will be in touch with your labs ASAP. I will send a message to your neurologist, Dr. Krista Blue, and ask her if she would suggest any medication or if she needs a follow-up visit. You can start on the Eliquis samples while you wait for your supply to arrive in the mail. I would suggest having a hearing evaluation and hearing aids if needed.  Difficulty hearing can cause social withdrawal, and can worsen memory problems. We will order a bone density scan for you to have done in the spring   Health Maintenance After Age 41 After age 68, you are at a higher risk for certain long-term diseases and infections as well as injuries from falls. Falls are a major cause of broken bones and head injuries in people who are older than age 5. Getting regular preventive care can help to keep you healthy and well. Preventive care includes getting regular testing and making lifestyle changes as recommended by your health care provider. Talk with your health care provider about:  Which screenings and tests you should have. A screening is a test that checks for a disease when you have no symptoms.  A diet and exercise plan that is right for you. What should I know about screenings and tests to prevent falls? Screening and testing are the best ways to find a health problem early. Early diagnosis and treatment give you the best chance of managing medical conditions that are common after age 72. Certain conditions and lifestyle choices may make you more likely to have a fall. Your health care provider may recommend:  Regular vision checks. Poor vision and conditions such as cataracts can make you more likely to have a fall. If you wear glasses, make sure to get your prescription updated if your vision changes.  Medicine review. Work with your health care provider to regularly review all of the medicines you are taking, including  over-the-counter medicines. Ask your health care provider about any side effects that may make you more likely to have a fall. Tell your health care provider if any medicines that you take make you feel dizzy or sleepy.  Osteoporosis screening. Osteoporosis is a condition that causes the bones to get weaker. This can make the bones weak and cause them to break more easily.  Blood pressure screening. Blood pressure changes and medicines to control blood pressure can make you feel dizzy.  Strength and balance checks. Your health care provider may recommend certain tests to check your strength and balance while standing, walking, or changing positions.  Foot health exam. Foot pain and numbness, as well as not wearing proper footwear, can make you more likely to have a fall.  Depression screening. You may be more likely to have a fall if you have a fear of falling, feel emotionally low, or feel unable to do activities that you used to do.  Alcohol use screening. Using too much alcohol can affect your balance and may make you more likely to have a fall. What actions can I take to lower my risk of falls? General instructions  Talk with your health care provider about your risks for falling. Tell your health care provider if: ? You fall. Be sure to tell your health care provider about all falls, even ones that seem minor. ? You feel dizzy, sleepy, or off-balance.  Take over-the-counter and prescription medicines only as told  by your health care provider. These include any supplements.  Eat a healthy diet and maintain a healthy weight. A healthy diet includes low-fat dairy products, low-fat (lean) meats, and fiber from whole grains, beans, and lots of fruits and vegetables. Home safety  Remove any tripping hazards, such as rugs, cords, and clutter.  Install safety equipment such as grab bars in bathrooms and safety rails on stairs.  Keep rooms and walkways well-lit. Activity   Follow a  regular exercise program to stay fit. This will help you maintain your balance. Ask your health care provider what types of exercise are appropriate for you.  If you need a cane or walker, use it as recommended by your health care provider.  Wear supportive shoes that have nonskid soles. Lifestyle  Do not drink alcohol if your health care provider tells you not to drink.  If you drink alcohol, limit how much you have: ? 0-1 drink a day for women. ? 0-2 drinks a day for men.  Be aware of how much alcohol is in your drink. In the [place] one drink equals one typical bottle of beer (12 oz), one-half glass of wine (5 oz), or one shot of hard liquor (1 oz).  Do not use any products that contain nicotine or tobacco, such as cigarettes and e-cigarettes. If you need help quitting, ask your health care provider. Summary  Having a healthy lifestyle and getting preventive care can help to protect your health and wellness after age 56.  Screening and testing are the best way to find a health problem early and help you avoid having a fall. Early diagnosis and treatment give you the best chance for managing medical conditions that are more common for people who are older than age 83.  Falls are a major cause of broken bones and head injuries in people who are older than age 75. Take precautions to prevent a fall at home.  Work with your health care provider to learn what changes you can make to improve your health and wellness and to prevent falls. This information is not intended to replace advice given to you by your health care provider. Make sure you discuss any questions you have with your health care provider. Document Released: 06/27/2017 Document Revised: 06/27/2017 Document Reviewed: 06/27/2017 Elsevier Interactive Patient Education  2019 [employer].

## 2018-08-19 NOTE — Telephone Encounter (Signed)
I have attempted to reach patient.  No answer or voicemail available.  We will try her again when our office reopens after the holiday closing.

## 2018-08-19 NOTE — Telephone Encounter (Signed)
Please give her a follow up at my next available.

## 2018-08-20 LAB — LIPID PANEL
CHOLESTEROL: 203 mg/dL — AB (ref 0–200)
HDL: 69.1 mg/dL (ref >39.00)
LDL Cholesterol: 114 mg/dL — ABNORMAL HIGH (ref 0–99)
NonHDL: 133.53
TRIGLYCERIDES: 100 mg/dL (ref 0.0–149.0)
Total CHOL/HDL Ratio: 3
VLDL: 20 mg/dL (ref 0.0–40.0)

## 2018-08-20 LAB — CBC
HCT: 41.8 % (ref 36.0–46.0)
Hemoglobin: 13.8 g/dL (ref 12.0–15.0)
MCHC: 33 g/dL (ref 30.0–36.0)
MCV: 91.5 fl (ref 78.0–100.0)
Platelets: 252 10*3/uL (ref 150.0–400.0)
RBC: 4.57 Mil/uL (ref 3.87–5.11)
RDW: 13.9 % (ref 11.5–15.5)
WBC: 6.1 10*3/uL (ref 4.0–10.5)

## 2018-08-20 LAB — COMPREHENSIVE METABOLIC PANEL
ALK PHOS: 51 U/L (ref 39–117)
ALT: 12 U/L (ref 0–35)
AST: 19 U/L (ref 0–37)
Albumin: 4.2 g/dL (ref 3.5–5.2)
BILIRUBIN TOTAL: 0.4 mg/dL (ref 0.2–1.2)
BUN: 13 mg/dL (ref 6–23)
CALCIUM: 9.9 mg/dL (ref 8.4–10.5)
CO2: 28 meq/L (ref 19–32)
Chloride: 102 mEq/L (ref 96–112)
Creatinine, Ser: 0.78 mg/dL (ref 0.40–1.20)
GFR: 74.05 mL/min (ref >60.00)
Glucose, Bld: 91 mg/dL (ref 70–99)
Potassium: 4.9 mEq/L (ref 3.5–5.1)
Sodium: 138 mEq/L (ref 135–145)
Total Protein: 6.9 g/dL (ref 6.0–8.3)

## 2018-08-20 LAB — HEMOGLOBIN A1C: Hgb A1c MFr Bld: 5.6 % (ref 4.6–6.5)

## 2018-08-22 MED ORDER — DONEPEZIL HCL 5 MG PO TABS: 5.0000 mg | ORAL_TABLET | Freq: Every day | ORAL | 3 refills | Status: DC

## 2018-08-22 NOTE — Addendum Note (Signed)
Addended by: Lamar Blinks C on: 08/22/2018 02:46 PM   Modules accepted: Orders

## 2018-08-27 ENCOUNTER — Telehealth: Payer: Self-pay

## 2018-08-27 NOTE — Telephone Encounter (Signed)
Copied from [place]. Topic: Referral - Status >> Aug 27, 2018  9:42 AM Berneta Levins wrote: Reason for CRM:   Delorise Jackson, medical POA, calling:  She wants to know if pt has been referred to Dr. Krista Blue yet and if an appointment has been set up. [name] can be reached at [phone]

## 2018-08-27 NOTE — Telephone Encounter (Signed)
I do not see any referral placed in patient's chart.

## 2018-08-29 ENCOUNTER — Encounter: Payer: Self-pay | Admitting: Family Medicine

## 2018-08-29 NOTE — Telephone Encounter (Signed)
I had sent a message to her neurologist, Dr. Krista Blue.  They should be contacting the patient for an appointment I called Carol [name] and left message on machine for her advising that they may call [facility] and set up an appointment themselves, things may have been delayed over the holidays

## 2018-08-29 NOTE — Telephone Encounter (Signed)
[name] advised of below message.

## 2018-08-30 NOTE — Progress Notes (Addendum)
[place] at [facility] [address] [place], [place] [address] [phone]  Date:  09/02/2018   Name:  Carol [name]   DOB:  Jan 01, 1928   MRN:  [id number]  PCP:  Darreld Mclean, MD    Chief Complaint: Referral (memory issues, already sees neurology? worsening, becoming paranoid, also needing ENT referral-trouble hearing bilateral ear) and Urinary Tract Infection   History of Present Illness:  Carol [name] is a 85 y.o. very pleasant female patient who presents with the following:  Her brother and SIL take care of her.   Her brother is [name], [name] is her SIL  We started her on aricept just recently and she does not notice any SE, she is tolerating this medication fine  I shared with [name] that her sister-in-law has been in touch with me with concerns about her memory.  Related the same anecdotes that [name] shared with me- about Carol [name]'s confusion regarding her Christmas decorations.  [name] was a little bit put out, as she thought this should be private.  I reminded her that [name] is on her HIPAA form, and [name] states understanding and does agree that her family has her best interest in mind.  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  She has insight that her memory is failing her some, and understands that she may need some help from family.  However, she states that she still feels very independent, and is difficult for her to relinquish any control over her affairs.  She does not have any pain or other acute symptoms today A friend drove her to the appointment as she was not able to find her keys. Advised we will check a urine today, in case she could have a urinary tract infection.  She has not noticed any urinary symptoms but is more than happy to give a urine sample.  I advised her that she does have a neurology  appointment coming up, and will also shows information with her family.  [name] has sent me another MyChart message, which I will respond to  Thx so much for your reply. Drema has had a better few days. I would like for you to do the Urinalysis when she comes in on Monday. She has the script that you ordered. Not sure she is taking it or other meds. My husband has briefly talked to her about getting someone to administer her meds and she is adamant about her competency to take her meds and take care of herself . My husband or her friend will bring her to the appointment tomorrow but will let her talk to you alone since she initiated this appointment. Maybe being alone with you will make it easier for her to acknowledge and talk about her memory issues and the events the last 2 wks. She is skeptical of the staff at the [facility]. My husband and I are working to get on the same page regarding Carol [name]'s issues. He realizes that she has a problem, but is having trouble accepting her limitations. She is a very intelligent, independent person who will have great difficulty realizing her limitations and help. Pls share thts.  Patient Active Problem List   Diagnosis Date Noted  . Paroxysmal atrial fibrillation ([place]) 08/13/2018  . Right knee pain 11/16/2017  . Osteopenia 11/12/2016  . Mild cognitive disorder 10/10/2016  .  Chronic anticoagulation 10/04/2016  . History of atrial fibrillation 10/04/2016  . Essential hypertension, benign 09/01/2016  . Paroxysmal supraventricular tachycardia ([place]) 09/01/2016  . Hyperlipidemia 09/01/2016    Past Medical History:  Diagnosis Date  . A-fib ([place])   . Anemia   . Arrhythmia   . Blood transfusion without reported diagnosis   . Breast cancer ([place])   . Cancer ([place])   . Cataract   . CHF (congestive heart failure) ([place])   . Dyslipidemia   . Hyperlipidemia   . Hypertension   . Paroxysmal supraventricular tachycardia ([place])   . Stroke [facility])     Past Surgical  History:  Procedure Laterality Date  . MASTECTOMY    . TONSILLECTOMY    . WRIST SURGERY      Social History   Tobacco Use  . Smoking status: Never Smoker  . Smokeless tobacco: Never Used  Substance Use Topics  . Alcohol use: No  . Drug use: No    Family History  Problem Relation Age of Onset  . Epilepsy Mother   . Stroke Father   . Heart disease Maternal Aunt     Allergies  Allergen Reactions  . Sertraline Other (See Comments)  . Zoloft [Sertraline Hcl]     headache    Medication list has been reviewed and updated.  Current Outpatient Medications on File Prior to Visit  Medication Sig Dispense Refill  . apixaban (ELIQUIS) 5 MG TABS tablet Take 1 tablet (5 mg total) by mouth 2 (two) times daily. 180 tablet 3  . donepezil (ARICEPT) 5 MG tablet Take 1 tablet (5 mg total) by mouth at bedtime. 30 tablet 3  . losartan (COZAAR) 50 MG tablet Take 1 tablet (50 mg total) by mouth daily. 90 tablet 3   No current facility-administered medications on file prior to visit.     Review of Systems:  As per HPI- otherwise negative. No fever or chills, no chest pain or shortness of breath.  Normal appetite.  Physical Examination: Vitals:   09/02/18 0923  BP: (!) 142/70  Pulse: 90  Resp: 16  SpO2: 95%   Vitals:   09/02/18 0923  Weight: 148 lb (67.1 kg)  Height: 5\' 1"  (1.549 m)   Body mass index is 27.96 kg/m. Ideal Body Weight: Weight in (lb) to have BMI = 25: 132  GEN: WDWN, NAD, Non-toxic, A & O x 3, looks well.  Neatly dressed and groomed. HEENT: Atraumatic, Normocephalic. Neck supple. No masses, No LAD. Ears and Nose: No external deformity. CV: RRR, No M/G/R. No JVD. No thrill. No extra heart sounds. PULM: CTA B, no wheezes, crackles, rhonchi. No retractions. No resp. distress. No accessory muscle use. EXTR: No c/c/e NEURO Normal gait.  PSYCH: Normally interactive. Conversant. Not depressed or anxious appearing.  Calm demeanor.  On casual conversation [name] seems  quite normal.  She does not have any recollection of the incident with her Christmas decorations which [name] described to me  Results for orders placed or performed in visit on 09/02/18  Urine Culture  Result Value Ref Range   MICRO NUMBER: [id number]    SPECIMEN QUALITY: Adequate    Sample Source URINE    STATUS: FINAL    Result: No Growth   Urine Microscopic Only  Result Value Ref Range   WBC, UA 0-2/hpf 0-2/hpf   RBC / HPF 0-2/hpf 0-2/hpf   Squamous Epithelial / LPF Rare(0-4/hpf) Rare(0-4/hpf)   Bacteria, UA Rare(<10/hpf) (A) None  POCT urinalysis dipstick  Result Value  Ref Range   Color, UA yellow yellow   Clarity, UA clear clear   Glucose, UA negative negative mg/dL   Bilirubin, UA negative negative   Ketones, POC UA negative negative mg/dL   Spec Grav, UA 1.010 1.010 - 1.025   Blood, UA trace-intact (A) negative   pH, UA 6.0 5.0 - 8.0   Protein Ur, POC negative negative mg/dL   Urobilinogen, UA 0.2 0.2 or 1.0 E.U./dL   Nitrite, UA Negative Negative   Leukocytes, UA Negative Negative     Assessment and Plan: Confusion - Plan: Urine Culture, POCT urinalysis dipstick  Microscopic hematuria - Plan: Urine Microscopic Only  Here today to discuss her confusion.  [name] has some limited insight into her memory loss.  However, she has been a very highly functioning person during her lifetime and is understandably reluctant to relinquish any control of her life and affairs.  We discussed our mutual agreement that her family members do have her best interest in mind, and she will try to let them help her as much as she feels able. Noted microhematuria on UA today, await urine culture. She is tolerating Aricept without any difficulty, neurology appointment is scheduled for next month  Signed Lamar Blinks, MD 1/8 Received her urine culture which is negative Message to patient, which is typically managed by her sister-in-law [name]

## 2018-08-30 NOTE — Telephone Encounter (Signed)
Regarding patient's appointment on Monday.

## 2018-09-02 ENCOUNTER — Ambulatory Visit (INDEPENDENT_AMBULATORY_CARE_PROVIDER_SITE_OTHER): Payer: Medicare HMO | Admitting: Family Medicine

## 2018-09-02 ENCOUNTER — Encounter: Payer: Self-pay | Admitting: Family Medicine

## 2018-09-02 VITALS — BP 142/70 | HR 90 | Resp 16 | Ht 61.0 in | Wt 148.0 lb

## 2018-09-02 DIAGNOSIS — R410 Disorientation, unspecified: Secondary | ICD-10-CM | POA: Diagnosis not present

## 2018-09-02 DIAGNOSIS — R3129 Other microscopic hematuria: Secondary | ICD-10-CM

## 2018-09-02 LAB — POCT URINALYSIS DIP (MANUAL ENTRY)
BILIRUBIN UA: NEGATIVE mg/dL
Bilirubin, UA: NEGATIVE
Glucose, UA: NEGATIVE mg/dL
Leukocytes, UA: NEGATIVE
Nitrite, UA: NEGATIVE
PROTEIN UA: NEGATIVE mg/dL
SPEC GRAV UA: 1.01 (ref 1.010–1.025)
Urobilinogen, UA: 0.2 E.U./dL
pH, UA: 6 (ref 5.0–8.0)

## 2018-09-02 LAB — URINALYSIS, MICROSCOPIC ONLY

## 2018-09-02 NOTE — Telephone Encounter (Signed)
I called pts sister Carol [name] (on [place]) offered appt with Dr. Krista Blue for 09/11/18 check in at 10:30. Carol [name] accepted appt and very thankful for the call. FYI

## 2018-09-02 NOTE — Telephone Encounter (Signed)
Noted/fim 

## 2018-09-02 NOTE — Patient Instructions (Addendum)
You do have a neurology appointment coming up- please see below.  Please be sure to go to this appointment Your ears do not show any earwax.  Hearing loss therefore is not due to wax build-up. If you are interested in getting hearing aids please visit an audiologist for formal hearing testing and fitting of hearing aids   Your urine test today did not show any definite infection but I have sent it for a culture and will be in touch with this report asap

## 2018-09-03 LAB — URINE CULTURE
MICRO NUMBER: 15725
RESULT: NO GROWTH
SPECIMEN QUALITY:: ADEQUATE

## 2018-09-04 ENCOUNTER — Encounter: Payer: Self-pay | Admitting: Family Medicine

## 2018-09-11 ENCOUNTER — Encounter: Payer: Self-pay | Admitting: Neurology

## 2018-09-11 ENCOUNTER — Ambulatory Visit: Payer: Medicare HMO | Admitting: Neurology

## 2018-09-11 VITALS — BP 142/77 | HR 84 | Ht 61.0 in | Wt 147.8 lb

## 2018-09-11 DIAGNOSIS — G3184 Mild cognitive impairment, so stated: Secondary | ICD-10-CM

## 2018-09-11 MED ORDER — DONEPEZIL HCL 10 MG PO TABS: 10.0000 mg | ORAL_TABLET | Freq: Every day | ORAL | 4 refills | Status: DC

## 2018-09-11 NOTE — Progress Notes (Addendum)
PATIENT: Carol [name] DOB: [birth year]  Chief Complaint  Patient presents with  . A-Fib/Hx of CVA/Memory Loss    MMSE 28/30 - 9 animals. She is here with her brother, Carol [name].  The patient has been lost to follow up.  She has continued taking Eliquis.   Feels her memory is worse.  She had two episodes of being paranoid that someone had broken into her home.  These occurred in December 2019.     HISTORICAL  Carol [name] is 85 year old right-handed female, accompanied by her sister-in-law Carol [name], seen in refer by her primary care doctor Gay Filler Copland for evaluation of intermittent speech disturbance. Initial evaluation was October 10 2016.  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  She reported maternal uncle and aunt suffered mild memory loss in their eighties, she now moved in to her retirement facility independent living, to be close to her brother.  She was noted to have mild word finding difficulties recently, struggling for word, tends to misplace things, she is otherwise ambulate without much difficulty, sleeps well has good appetite no bowel and bladder issues.   I revealed laboratory evaluation, normal INR 1.1, negative troponin, UDS, CMP, CBC, TSH, LDL 87,  UPDATE Jan 15th 2020: She lives in an [facility] living, is accompanied by her brother today's visit, reported slow worsening memory loss since last visit in February 2018, also developed paranoid in December 2019, she thought people has broke into her apartment, she tends to misplace things and cannot  find it, call police officer to report her loss, was seen by Dr. Lorelei Pont in December, started Aricept 5 mg daily, she tolerated it well,  We personally reviewed MRI of the brain in February 2018, generalized atrophy, supratentorium small vessel disease, ventriculomegaly, there was no acute abnormality  Laboratory evaluations showed normal negative CBC CMP, B12 in February 2018 REVIEW OF SYSTEMS: Full 14 system review of systems performed and notable only for as above All rest review of the system were negative ALLERGIES: Allergies  Allergen Reactions  . Sertraline Other (See Comments)  . Zoloft [Sertraline Hcl]     headache    HOME MEDICATIONS: Current Outpatient Medications  Medication Sig Dispense Refill  . apixaban (ELIQUIS) 5 MG TABS tablet Take 1 tablet (5 mg total) by mouth 2 (two) times daily. 180 tablet 3  . donepezil (ARICEPT) 5 MG tablet Take 1 tablet (5 mg total) by mouth at bedtime. 30 tablet 3  . losartan (COZAAR) 50 MG tablet Take 1 tablet (50 mg total) by mouth daily. 90 tablet 3   No current facility-administered medications for this visit.     PAST MEDICAL HISTORY: Past Medical History:  Diagnosis Date  . A-fib ([place])   . Anemia   . Arrhythmia   . Blood transfusion without reported diagnosis   . Breast cancer ([place])   . Cancer ([place])   . Cataract   . CHF (congestive heart failure) ([place])   . Dyslipidemia   . Hyperlipidemia   . Hypertension   . Paroxysmal supraventricular tachycardia ([place])   . Stroke [facility])     PAST SURGICAL HISTORY: Past Surgical History:  Procedure Laterality Date  . MASTECTOMY    . TONSILLECTOMY    . WRIST SURGERY      FAMILY HISTORY: Family History  Problem Relation Age of Onset  . Epilepsy Mother   . Stroke Father   . Heart disease Maternal Aunt     SOCIAL HISTORY:  Social History   Socioeconomic History  . Marital status: Widowed    Spouse name: Not on file  . Number of children: 2  . Years of education: 2 years  college  . Highest education level: Not on file  Occupational History  . Occupation: Retired  Scientific laboratory technician  . Financial resource strain: Not on file  . Food insecurity:    Worry: Not on file    Inability: Not on file  . Transportation needs:    Medical: Not on file    Non-medical: Not on file  Tobacco Use  . Smoking status: Never Smoker  . Smokeless tobacco: Never Used  Substance and Sexual Activity  . Alcohol use: No  . Drug use: No  . Sexual activity: Not on file  Lifestyle  . Physical activity:    Days per week: Not on file    Minutes per session: Not on file  . Stress: Not on file  Relationships  . Social connections:    Talks on phone: Not on file    Gets together: Not on file    Attends religious service: Not on file    Active member of club or organization: Not on file    Attends meetings of clubs or organizations: Not on file    Relationship status: Not on file  . Intimate partner violence:    Fear of current or ex partner: Not on file    Emotionally abused: Not on file    Physically abused: Not on file    Forced sexual activity: Not on file  Other Topics Concern  . Not on file  Social History Narrative   Lives in a retirement community.   Right-handed.   Only drinks caffeine some days.     PHYSICAL EXAM   Vitals:   09/11/18 1054  BP: (!) 142/77  Pulse: 84  Weight: 147 lb 12 oz (67 kg)  Height: 5\' 1"  (1.549 m)    Not recorded      Body mass index is 27.92 kg/m.  PHYSICAL EXAMNIATION:  Gen: NAD, conversant, well nourised, obese, well groomed                     Cardiovascular: Regular rate rhythm, no peripheral edema, warm, nontender. Eyes: Conjunctivae clear without exudates or hemorrhage Neck: Supple, no carotid bruits. Pulmonary: Clear to auscultation bilaterally   NEUROLOGICAL EXAM: MMSE - Mini Mental State Exam 09/11/2018  Orientation to time 4  Orientation to Place 5  Registration 3  Attention/ Calculation 5  Recall 2  Language-  name 2 objects 2  Language- repeat 1  Language- follow 3 step command 3  Language- read & follow direction 1  Write a sentence 1  Copy design 1  Total score 28  animal naming 9   CRANIAL NERVES: CN II: Visual fields are full to confrontation.  Pupils are round equal and briskly reactive to light. CN III, IV, VI: extraocular movement are normal. No ptosis. CN V: Facial sensation is intact to pinprick in all 3 divisions bilaterally. Corneal responses are intact.  CN VII: Face is symmetric with normal eye closure and smile. CN VIII: Hearing is  normal to rubbing fingers CN IX, X: Palate elevates symmetrically. Phonation is normal. CN XI: Head turning and shoulder shrug are intact CN XII: Tongue is midline with normal movements and no atrophy.  MOTOR: There is no pronator drift of out-stretched arms. Muscle bulk and tone are normal. Muscle strength is normal.  REFLEXES: Reflexes are 2+ and symmetric at the biceps, triceps, knees, and ankles. Plantar responses are flexor.  SENSORY: Intact to light touch, pinprick, positional sensation and vibratory sensation are intact in fingers and toes.  COORDINATION: Rapid alternating movements and fine finger movements are intact. There is no dysmetria on finger-to-nose and heel-knee-shin.    GAIT/STANCE: Posture is normal. Gait is steady with normal steps, base, arm swing, and turning. Heel and toe walking are normal. Tandem gait is normal.  Romberg is absent.   DIAGNOSTIC DATA (LABS, IMAGING, TESTING) - I reviewed patient records, labs, notes, testing and imaging myself where available.   ASSESSMENT AND PLAN  Carol [name] is a 85 y.o. female   Mild cognitive impairment  MRI of the brain without contrast showed generalized atrophy, mild supratentorium small vessel disease ventriculomegaly  Laboratory evaluations check TSH  Increase Aricept to 10 mg daily,  Only return to clinic for new issues   Marcial Pacas, M.D. Ph.D.  [facility] [address] [place], [place] [address] Ph: [phone] Fax: [phone]  CC: Darreld Mclean, MD

## 2018-09-12 LAB — TSH: TSH: 1.94 u[IU]/mL (ref 0.450–4.500)

## 2018-09-21 ENCOUNTER — Encounter: Payer: Self-pay | Admitting: Family Medicine

## 2018-09-21 DIAGNOSIS — R413 Other amnesia: Secondary | ICD-10-CM

## 2018-10-14 ENCOUNTER — Encounter

## 2018-10-14 ENCOUNTER — Ambulatory Visit: Payer: Medicare HMO | Admitting: Neurology

## 2018-10-21 ENCOUNTER — Encounter: Payer: Self-pay | Admitting: Family Medicine

## 2018-10-21 DIAGNOSIS — E2839 Other primary ovarian failure: Secondary | ICD-10-CM

## 2018-10-21 DIAGNOSIS — Z8739 Personal history of other diseases of the musculoskeletal system and connective tissue: Secondary | ICD-10-CM

## 2018-10-29 ENCOUNTER — Encounter: Payer: Self-pay | Admitting: Family Medicine

## 2018-11-01 ENCOUNTER — Telehealth: Payer: Self-pay

## 2018-11-01 NOTE — Telephone Encounter (Signed)
Copied from [place] [phone] [phone]. Topic: General - Other >> Nov 01, 2018  3:21 PM Carol [name] wrote: Reason for CRM: Patient called stating that her foam prosthesis for her breast has split open. Patient would like to know what she should do, specifically how she should go about getting another. Please advise.

## 2018-11-04 NOTE — Telephone Encounter (Signed)
Called her back- suggested [facility]. Gave her the info and she will call them

## 2018-11-05 DIAGNOSIS — S0990XA Unspecified injury of head, initial encounter: Secondary | ICD-10-CM | POA: Diagnosis not present

## 2018-11-05 DIAGNOSIS — R52 Pain, unspecified: Secondary | ICD-10-CM | POA: Diagnosis not present

## 2018-11-05 DIAGNOSIS — S4991XA Unspecified injury of right shoulder and upper arm, initial encounter: Secondary | ICD-10-CM | POA: Diagnosis not present

## 2018-11-05 DIAGNOSIS — I10 Essential (primary) hypertension: Secondary | ICD-10-CM | POA: Diagnosis not present

## 2018-11-05 DIAGNOSIS — Y92009 Unspecified place in unspecified non-institutional (private) residence as the place of occurrence of the external cause: Secondary | ICD-10-CM | POA: Diagnosis not present

## 2018-11-05 DIAGNOSIS — I672 Cerebral atherosclerosis: Secondary | ICD-10-CM | POA: Diagnosis not present

## 2018-11-05 DIAGNOSIS — M25519 Pain in unspecified shoulder: Secondary | ICD-10-CM | POA: Diagnosis not present

## 2018-11-05 DIAGNOSIS — W07XXXA Fall from chair, initial encounter: Secondary | ICD-10-CM | POA: Diagnosis not present

## 2018-11-05 DIAGNOSIS — W19XXXA Unspecified fall, initial encounter: Secondary | ICD-10-CM | POA: Diagnosis not present

## 2018-11-05 DIAGNOSIS — Y998 Other external cause status: Secondary | ICD-10-CM | POA: Diagnosis not present

## 2018-11-05 DIAGNOSIS — S42291A Other displaced fracture of upper end of right humerus, initial encounter for closed fracture: Secondary | ICD-10-CM | POA: Diagnosis not present

## 2018-11-05 DIAGNOSIS — S42201A Unspecified fracture of upper end of right humerus, initial encounter for closed fracture: Secondary | ICD-10-CM | POA: Diagnosis not present

## 2018-11-05 DIAGNOSIS — S2241XA Multiple fractures of ribs, right side, initial encounter for closed fracture: Secondary | ICD-10-CM | POA: Diagnosis not present

## 2018-11-06 ENCOUNTER — Encounter: Payer: Self-pay | Admitting: Family Medicine

## 2018-11-08 DIAGNOSIS — S42291A Other displaced fracture of upper end of right humerus, initial encounter for closed fracture: Secondary | ICD-10-CM | POA: Diagnosis not present

## 2018-11-11 ENCOUNTER — Other Ambulatory Visit (HOSPITAL_BASED_OUTPATIENT_CLINIC_OR_DEPARTMENT_OTHER): Payer: Medicare HMO

## 2018-11-11 DIAGNOSIS — R410 Disorientation, unspecified: Secondary | ICD-10-CM | POA: Diagnosis not present

## 2018-11-11 DIAGNOSIS — I493 Ventricular premature depolarization: Secondary | ICD-10-CM | POA: Diagnosis not present

## 2018-11-11 DIAGNOSIS — R9389 Abnormal findings on diagnostic imaging of other specified body structures: Secondary | ICD-10-CM | POA: Diagnosis not present

## 2018-11-11 DIAGNOSIS — E878 Other disorders of electrolyte and fluid balance, not elsewhere classified: Secondary | ICD-10-CM | POA: Diagnosis not present

## 2018-11-11 DIAGNOSIS — J690 Pneumonitis due to inhalation of food and vomit: Secondary | ICD-10-CM | POA: Diagnosis not present

## 2018-11-11 DIAGNOSIS — S42309A Unspecified fracture of shaft of humerus, unspecified arm, initial encounter for closed fracture: Secondary | ICD-10-CM | POA: Diagnosis not present

## 2018-11-11 DIAGNOSIS — S42301A Unspecified fracture of shaft of humerus, right arm, initial encounter for closed fracture: Secondary | ICD-10-CM | POA: Diagnosis not present

## 2018-11-11 DIAGNOSIS — I480 Paroxysmal atrial fibrillation: Secondary | ICD-10-CM | POA: Diagnosis not present

## 2018-11-11 DIAGNOSIS — Z79899 Other long term (current) drug therapy: Secondary | ICD-10-CM | POA: Diagnosis not present

## 2018-11-11 DIAGNOSIS — I4891 Unspecified atrial fibrillation: Secondary | ICD-10-CM | POA: Diagnosis not present

## 2018-11-11 DIAGNOSIS — R918 Other nonspecific abnormal finding of lung field: Secondary | ICD-10-CM | POA: Diagnosis not present

## 2018-11-11 DIAGNOSIS — G40909 Epilepsy, unspecified, not intractable, without status epilepticus: Secondary | ICD-10-CM | POA: Diagnosis not present

## 2018-11-11 DIAGNOSIS — R55 Syncope and collapse: Secondary | ICD-10-CM | POA: Diagnosis not present

## 2018-11-11 DIAGNOSIS — I10 Essential (primary) hypertension: Secondary | ICD-10-CM | POA: Diagnosis not present

## 2018-11-11 DIAGNOSIS — R9431 Abnormal electrocardiogram [ECG] [EKG]: Secondary | ICD-10-CM | POA: Diagnosis not present

## 2018-11-11 DIAGNOSIS — I4581 Long QT syndrome: Secondary | ICD-10-CM | POA: Diagnosis not present

## 2018-11-11 DIAGNOSIS — Z7901 Long term (current) use of anticoagulants: Secondary | ICD-10-CM | POA: Diagnosis not present

## 2018-11-11 DIAGNOSIS — R0902 Hypoxemia: Secondary | ICD-10-CM | POA: Diagnosis not present

## 2018-11-11 DIAGNOSIS — R569 Unspecified convulsions: Secondary | ICD-10-CM | POA: Diagnosis not present

## 2018-11-11 DIAGNOSIS — R4020 Unspecified coma: Secondary | ICD-10-CM | POA: Diagnosis not present

## 2018-11-12 DIAGNOSIS — J690 Pneumonitis due to inhalation of food and vomit: Secondary | ICD-10-CM | POA: Diagnosis not present

## 2018-11-12 DIAGNOSIS — R569 Unspecified convulsions: Secondary | ICD-10-CM | POA: Diagnosis not present

## 2018-11-12 DIAGNOSIS — S42301A Unspecified fracture of shaft of humerus, right arm, initial encounter for closed fracture: Secondary | ICD-10-CM | POA: Diagnosis not present

## 2018-11-12 DIAGNOSIS — I10 Essential (primary) hypertension: Secondary | ICD-10-CM | POA: Diagnosis not present

## 2018-11-12 DIAGNOSIS — Z7901 Long term (current) use of anticoagulants: Secondary | ICD-10-CM | POA: Diagnosis not present

## 2018-11-12 DIAGNOSIS — I4891 Unspecified atrial fibrillation: Secondary | ICD-10-CM | POA: Diagnosis not present

## 2018-11-13 DIAGNOSIS — I480 Paroxysmal atrial fibrillation: Secondary | ICD-10-CM | POA: Diagnosis not present

## 2018-11-13 DIAGNOSIS — Z7901 Long term (current) use of anticoagulants: Secondary | ICD-10-CM | POA: Diagnosis not present

## 2018-11-13 DIAGNOSIS — G3184 Mild cognitive impairment, so stated: Secondary | ICD-10-CM | POA: Diagnosis not present

## 2018-11-13 DIAGNOSIS — R569 Unspecified convulsions: Secondary | ICD-10-CM | POA: Diagnosis not present

## 2018-11-13 DIAGNOSIS — Z853 Personal history of malignant neoplasm of breast: Secondary | ICD-10-CM | POA: Diagnosis not present

## 2018-11-13 DIAGNOSIS — S42301D Unspecified fracture of shaft of humerus, right arm, subsequent encounter for fracture with routine healing: Secondary | ICD-10-CM | POA: Diagnosis not present

## 2018-11-13 DIAGNOSIS — I10 Essential (primary) hypertension: Secondary | ICD-10-CM | POA: Diagnosis not present

## 2018-11-13 DIAGNOSIS — Z9181 History of falling: Secondary | ICD-10-CM | POA: Diagnosis not present

## 2018-11-13 DIAGNOSIS — Z8673 Personal history of transient ischemic attack (TIA), and cerebral infarction without residual deficits: Secondary | ICD-10-CM | POA: Diagnosis not present

## 2018-11-14 ENCOUNTER — Telehealth: Payer: Self-pay | Admitting: Family Medicine

## 2018-11-14 DIAGNOSIS — S42301D Unspecified fracture of shaft of humerus, right arm, subsequent encounter for fracture with routine healing: Secondary | ICD-10-CM | POA: Diagnosis not present

## 2018-11-14 DIAGNOSIS — R569 Unspecified convulsions: Secondary | ICD-10-CM | POA: Diagnosis not present

## 2018-11-14 DIAGNOSIS — I480 Paroxysmal atrial fibrillation: Secondary | ICD-10-CM | POA: Diagnosis not present

## 2018-11-14 DIAGNOSIS — G3184 Mild cognitive impairment, so stated: Secondary | ICD-10-CM | POA: Diagnosis not present

## 2018-11-14 DIAGNOSIS — Z7901 Long term (current) use of anticoagulants: Secondary | ICD-10-CM | POA: Diagnosis not present

## 2018-11-14 DIAGNOSIS — Z853 Personal history of malignant neoplasm of breast: Secondary | ICD-10-CM | POA: Diagnosis not present

## 2018-11-14 DIAGNOSIS — Z9181 History of falling: Secondary | ICD-10-CM | POA: Diagnosis not present

## 2018-11-14 DIAGNOSIS — I10 Essential (primary) hypertension: Secondary | ICD-10-CM | POA: Diagnosis not present

## 2018-11-14 DIAGNOSIS — Z8673 Personal history of transient ischemic attack (TIA), and cerebral infarction without residual deficits: Secondary | ICD-10-CM | POA: Diagnosis not present

## 2018-11-14 NOTE — Telephone Encounter (Signed)
Copied from [place] [phone]. Topic: Quick Communication - Home Health Verbal Orders >> Nov 14, 2018  8:43 AM Margot Ables wrote: Caller/Agency: Dawson Nation RN with [facility] Number: [phone], secure VM if no answer Requesting OT/PT/Skilled Nursing/Social Work/Speech Therapy: SN and PT Frequency: pt was at [facility] and fell, fx right humorus, pt was referred for nursing and PT Requesting VO for SN observation, assessment, medication education mgmt 1 week 5; VO for PT to eval and treat for home safety

## 2018-11-14 NOTE — Telephone Encounter (Signed)
Spoke with Laurence Slate, verbal orders given.

## 2018-11-15 ENCOUNTER — Encounter: Payer: Self-pay | Admitting: Family Medicine

## 2018-11-18 ENCOUNTER — Encounter: Payer: Self-pay | Admitting: Family Medicine

## 2018-11-18 ENCOUNTER — Ambulatory Visit (INDEPENDENT_AMBULATORY_CARE_PROVIDER_SITE_OTHER): Payer: Medicare HMO | Admitting: Family Medicine

## 2018-11-18 ENCOUNTER — Other Ambulatory Visit: Payer: Self-pay

## 2018-11-18 DIAGNOSIS — I4891 Unspecified atrial fibrillation: Secondary | ICD-10-CM

## 2018-11-18 DIAGNOSIS — S42296A Other nondisplaced fracture of upper end of unspecified humerus, initial encounter for closed fracture: Secondary | ICD-10-CM | POA: Diagnosis not present

## 2018-11-18 DIAGNOSIS — Z09 Encounter for follow-up examination after completed treatment for conditions other than malignant neoplasm: Secondary | ICD-10-CM

## 2018-11-18 NOTE — Progress Notes (Signed)
[place] at [facility] [address] [place], [place] [address] [phone]  Date:  11/18/2018   Name:  Carol [name]   DOB:  [birth year]   MRN:  [id number]  PCP:  Darreld Mclean, MD    Chief Complaint: No chief complaint on file.  History of Present Illness:  Carol [name] is a 85 y.o. very pleasant female patient who presents with the following:  I connected with Carol [name] on 11/18/18 at  1:00 PM EDT by telephone and verified that I am speaking with the correct person using two identifiers. I discussed the limitations, risks, security and privacy concerns of performing an evaluation and management service by telephone and the availability of in person appointments. I also discussed with the patient that there may be a patient responsible charge related to this service. The patient expressed understanding and agreed to proceed.  Called and spoke to pt and her health attendant Carol [name] who was in her home History of HTN, a fib on eliquis, mild memory impairment  She has recently been seen in the ER on 2 occasions - [facility] On 3/10 she fell at home and sustained a right humerus fracture. She was discharged to home and has been to see her orthopedist who plans to treat without surgery.  They are using a sling for now  Then on 3/16 she was seen in the ER again for possible seizure like activity, thought due to tramadol use She was observed overnight and released to home.  She had a brain MRI which was negtaive Per ER note her a fib was rate controlled, BP was ok She was given augmentin for questionable pneumonitis on her CXR due to seizure/ aspiration  I got the following mychart message, I presume from her SIL Carol [name].  I will respond to this message  Hello I wanted to follow up my last message regarding [facility]. After her fall, she returned to [facility]. Last Friday, we saw Dr Davina Poke. He confirmed that she had 4 fractures in her  right shoulder. He changed her medication for pain to Tramadol. He hopes that the injury will heal on its own without surgery, and so do we. She returned home. She has a care provider who comes 3x a day and overnight. Around 9 the next morning, Carol [name] had what her provider described as a seizure. She again went to the [facility] by ambulance and. was admitted for overnight. She was treated br an ER Doc named Dr Warner Mccreedy and later after being admitted, she was treated by Dr Brigitte Pulse. She retuned home the following morning. She has seen an occupational therapist. She has very little energy and no appetite. Her medication is being given by her care provider. Dr Brigitte Pulse stopped the Tramadol and replaced it with Oxicodine. See u 9/23.  Pt reports that right now she is doing well  No tramadol now  She is using oxycodone now which is controlling her pain She is going to see ortho soon once things calm down to follow-up on her fracture She is not having any bleeding or unusual bruising  Her home aid is not checking her BP No fever No vomiting Pt reports that she is eating well  Spoke with Carol [name] and answered questions for her  She is walking some at home, PT is evaluating her and will let us know if she needs a walker or any other assistive device which I am  glad to rx for her  Carol [name] is monitoring her closely for any sign of sacral decubitus ulcer.  I encouraged her to have the patient sit on various surfaces, and obtain a foam pad for her to sit on if possible.  She cannot really lie in her bed right now as her arm hurts too much At this point she does not have any skin breakdown, but we are monitoring closely.  I have encouraged the patient and her caregiver to contact me if any other questions or concerns  Patient Active Problem List   Diagnosis Date Noted  . Paroxysmal atrial fibrillation ([place]) 08/13/2018  . Right knee pain 11/16/2017  . Osteopenia 11/12/2016  . Mild cognitive disorder  10/10/2016  . Chronic anticoagulation 10/04/2016  . History of atrial fibrillation 10/04/2016  . Essential hypertension, benign 09/01/2016  . Paroxysmal supraventricular tachycardia ([place]) 09/01/2016  . Hyperlipidemia 09/01/2016    Past Medical History:  Diagnosis Date  . A-fib ([place])   . Anemia   . Arrhythmia   . Blood transfusion without reported diagnosis   . Breast cancer ([place])   . Cancer ([place])   . Cataract   . CHF (congestive heart failure) ([place])   . Dyslipidemia   . Hyperlipidemia   . Hypertension   . Paroxysmal supraventricular tachycardia ([place])   . Stroke [facility])     Past Surgical History:  Procedure Laterality Date  . MASTECTOMY    . TONSILLECTOMY    . WRIST SURGERY      Social History   Tobacco Use  . Smoking status: Never Smoker  . Smokeless tobacco: Never Used  Substance Use Topics  . Alcohol use: No  . Drug use: No    Family History  Problem Relation Age of Onset  . Epilepsy Mother   . Stroke Father   . Heart disease Maternal Aunt     Allergies  Allergen Reactions  . Sertraline Other (See Comments)  . Zoloft [Sertraline Hcl]     headache    Medication list has been reviewed and updated.  Current Outpatient Medications on File Prior to Visit  Medication Sig Dispense Refill  . apixaban (ELIQUIS) 5 MG TABS tablet Take 1 tablet (5 mg total) by mouth 2 (two) times daily. 180 tablet 3  . donepezil (ARICEPT) 10 MG tablet Take 1 tablet (10 mg total) by mouth at bedtime. 90 tablet 4  . losartan (COZAAR) 50 MG tablet Take 1 tablet (50 mg total) by mouth daily. 90 tablet 3   No current facility-administered medications on file prior to visit.      Assessment and Plan: Hospital discharge follow-up  Atrial fibrillation, unspecified type ([place])  Other closed nondisplaced fracture of proximal end of humerus, unspecified laterality, initial encounter  Performed telephone visit today due to covid 19 outbreak.  Patient feels that she is doing okay, she  will let me know if any concerns.  Her pain is controlled with oxycodone.  She was recently evaluated in the emergency room, and was found to have rate controlled atrial fib.  She notes no fever or other acute symptoms Her caregiver is working hard to prevent any sacral decubitus ulcer.  I have asked him to contact me if any other concerns.  Plan to follow-up with orthopedics for her arm as evolving pandemic allows Signed Lamar Blinks, MD  Virtual Visit via Telephone Note  I provided 10 minutes of non-face-to-face time during this encounter.   Lamar Blinks, MD

## 2018-11-19 DIAGNOSIS — S42301D Unspecified fracture of shaft of humerus, right arm, subsequent encounter for fracture with routine healing: Secondary | ICD-10-CM | POA: Diagnosis not present

## 2018-11-19 DIAGNOSIS — Z9181 History of falling: Secondary | ICD-10-CM | POA: Diagnosis not present

## 2018-11-19 DIAGNOSIS — R569 Unspecified convulsions: Secondary | ICD-10-CM | POA: Diagnosis not present

## 2018-11-19 DIAGNOSIS — Z8673 Personal history of transient ischemic attack (TIA), and cerebral infarction without residual deficits: Secondary | ICD-10-CM | POA: Diagnosis not present

## 2018-11-19 DIAGNOSIS — Z7901 Long term (current) use of anticoagulants: Secondary | ICD-10-CM | POA: Diagnosis not present

## 2018-11-19 DIAGNOSIS — Z853 Personal history of malignant neoplasm of breast: Secondary | ICD-10-CM | POA: Diagnosis not present

## 2018-11-19 DIAGNOSIS — I10 Essential (primary) hypertension: Secondary | ICD-10-CM | POA: Diagnosis not present

## 2018-11-19 DIAGNOSIS — G3184 Mild cognitive impairment, so stated: Secondary | ICD-10-CM | POA: Diagnosis not present

## 2018-11-19 DIAGNOSIS — I480 Paroxysmal atrial fibrillation: Secondary | ICD-10-CM | POA: Diagnosis not present

## 2018-11-20 DIAGNOSIS — I480 Paroxysmal atrial fibrillation: Secondary | ICD-10-CM | POA: Diagnosis not present

## 2018-11-20 DIAGNOSIS — Z8673 Personal history of transient ischemic attack (TIA), and cerebral infarction without residual deficits: Secondary | ICD-10-CM | POA: Diagnosis not present

## 2018-11-20 DIAGNOSIS — Z9181 History of falling: Secondary | ICD-10-CM | POA: Diagnosis not present

## 2018-11-20 DIAGNOSIS — R569 Unspecified convulsions: Secondary | ICD-10-CM | POA: Diagnosis not present

## 2018-11-20 DIAGNOSIS — G3184 Mild cognitive impairment, so stated: Secondary | ICD-10-CM | POA: Diagnosis not present

## 2018-11-20 DIAGNOSIS — I10 Essential (primary) hypertension: Secondary | ICD-10-CM | POA: Diagnosis not present

## 2018-11-20 DIAGNOSIS — Z7901 Long term (current) use of anticoagulants: Secondary | ICD-10-CM | POA: Diagnosis not present

## 2018-11-20 DIAGNOSIS — S42301D Unspecified fracture of shaft of humerus, right arm, subsequent encounter for fracture with routine healing: Secondary | ICD-10-CM | POA: Diagnosis not present

## 2018-11-20 DIAGNOSIS — Z853 Personal history of malignant neoplasm of breast: Secondary | ICD-10-CM | POA: Diagnosis not present

## 2018-11-21 DIAGNOSIS — G3184 Mild cognitive impairment, so stated: Secondary | ICD-10-CM | POA: Diagnosis not present

## 2018-11-21 DIAGNOSIS — I480 Paroxysmal atrial fibrillation: Secondary | ICD-10-CM | POA: Diagnosis not present

## 2018-11-21 DIAGNOSIS — R569 Unspecified convulsions: Secondary | ICD-10-CM | POA: Diagnosis not present

## 2018-11-21 DIAGNOSIS — I10 Essential (primary) hypertension: Secondary | ICD-10-CM | POA: Diagnosis not present

## 2018-11-21 DIAGNOSIS — Z9181 History of falling: Secondary | ICD-10-CM | POA: Diagnosis not present

## 2018-11-21 DIAGNOSIS — Z8673 Personal history of transient ischemic attack (TIA), and cerebral infarction without residual deficits: Secondary | ICD-10-CM | POA: Diagnosis not present

## 2018-11-21 DIAGNOSIS — Z7901 Long term (current) use of anticoagulants: Secondary | ICD-10-CM | POA: Diagnosis not present

## 2018-11-21 DIAGNOSIS — Z853 Personal history of malignant neoplasm of breast: Secondary | ICD-10-CM | POA: Diagnosis not present

## 2018-11-21 DIAGNOSIS — S42301D Unspecified fracture of shaft of humerus, right arm, subsequent encounter for fracture with routine healing: Secondary | ICD-10-CM | POA: Diagnosis not present

## 2018-11-25 DIAGNOSIS — Z853 Personal history of malignant neoplasm of breast: Secondary | ICD-10-CM | POA: Diagnosis not present

## 2018-11-25 DIAGNOSIS — R569 Unspecified convulsions: Secondary | ICD-10-CM | POA: Diagnosis not present

## 2018-11-25 DIAGNOSIS — I480 Paroxysmal atrial fibrillation: Secondary | ICD-10-CM | POA: Diagnosis not present

## 2018-11-25 DIAGNOSIS — Z8673 Personal history of transient ischemic attack (TIA), and cerebral infarction without residual deficits: Secondary | ICD-10-CM | POA: Diagnosis not present

## 2018-11-25 DIAGNOSIS — S42301D Unspecified fracture of shaft of humerus, right arm, subsequent encounter for fracture with routine healing: Secondary | ICD-10-CM | POA: Diagnosis not present

## 2018-11-25 DIAGNOSIS — I10 Essential (primary) hypertension: Secondary | ICD-10-CM | POA: Diagnosis not present

## 2018-11-25 DIAGNOSIS — Z9181 History of falling: Secondary | ICD-10-CM | POA: Diagnosis not present

## 2018-11-25 DIAGNOSIS — Z7901 Long term (current) use of anticoagulants: Secondary | ICD-10-CM | POA: Diagnosis not present

## 2018-11-25 DIAGNOSIS — G3184 Mild cognitive impairment, so stated: Secondary | ICD-10-CM | POA: Diagnosis not present

## 2018-11-26 ENCOUNTER — Telehealth: Payer: Self-pay | Admitting: *Deleted

## 2018-11-26 NOTE — Telephone Encounter (Signed)
Received Home Health Certification and Plan of Care; forwarded to provider/SLS 03/31  

## 2018-11-27 DIAGNOSIS — I10 Essential (primary) hypertension: Secondary | ICD-10-CM | POA: Diagnosis not present

## 2018-11-27 DIAGNOSIS — G3184 Mild cognitive impairment, so stated: Secondary | ICD-10-CM | POA: Diagnosis not present

## 2018-11-27 DIAGNOSIS — Z9181 History of falling: Secondary | ICD-10-CM | POA: Diagnosis not present

## 2018-11-27 DIAGNOSIS — I480 Paroxysmal atrial fibrillation: Secondary | ICD-10-CM | POA: Diagnosis not present

## 2018-11-27 DIAGNOSIS — R569 Unspecified convulsions: Secondary | ICD-10-CM | POA: Diagnosis not present

## 2018-11-27 DIAGNOSIS — S42301D Unspecified fracture of shaft of humerus, right arm, subsequent encounter for fracture with routine healing: Secondary | ICD-10-CM | POA: Diagnosis not present

## 2018-11-27 DIAGNOSIS — Z853 Personal history of malignant neoplasm of breast: Secondary | ICD-10-CM | POA: Diagnosis not present

## 2018-11-27 DIAGNOSIS — Z7901 Long term (current) use of anticoagulants: Secondary | ICD-10-CM | POA: Diagnosis not present

## 2018-11-27 DIAGNOSIS — Z8673 Personal history of transient ischemic attack (TIA), and cerebral infarction without residual deficits: Secondary | ICD-10-CM | POA: Diagnosis not present

## 2018-11-29 DIAGNOSIS — S42291A Other displaced fracture of upper end of right humerus, initial encounter for closed fracture: Secondary | ICD-10-CM | POA: Diagnosis not present

## 2018-12-03 DIAGNOSIS — S42301D Unspecified fracture of shaft of humerus, right arm, subsequent encounter for fracture with routine healing: Secondary | ICD-10-CM | POA: Diagnosis not present

## 2018-12-03 DIAGNOSIS — Z8673 Personal history of transient ischemic attack (TIA), and cerebral infarction without residual deficits: Secondary | ICD-10-CM | POA: Diagnosis not present

## 2018-12-03 DIAGNOSIS — G3184 Mild cognitive impairment, so stated: Secondary | ICD-10-CM | POA: Diagnosis not present

## 2018-12-03 DIAGNOSIS — I10 Essential (primary) hypertension: Secondary | ICD-10-CM | POA: Diagnosis not present

## 2018-12-03 DIAGNOSIS — Z7901 Long term (current) use of anticoagulants: Secondary | ICD-10-CM | POA: Diagnosis not present

## 2018-12-03 DIAGNOSIS — R569 Unspecified convulsions: Secondary | ICD-10-CM | POA: Diagnosis not present

## 2018-12-03 DIAGNOSIS — I480 Paroxysmal atrial fibrillation: Secondary | ICD-10-CM | POA: Diagnosis not present

## 2018-12-03 DIAGNOSIS — Z9181 History of falling: Secondary | ICD-10-CM | POA: Diagnosis not present

## 2018-12-03 DIAGNOSIS — Z853 Personal history of malignant neoplasm of breast: Secondary | ICD-10-CM | POA: Diagnosis not present

## 2018-12-04 DIAGNOSIS — S42301D Unspecified fracture of shaft of humerus, right arm, subsequent encounter for fracture with routine healing: Secondary | ICD-10-CM | POA: Diagnosis not present

## 2018-12-04 DIAGNOSIS — G3184 Mild cognitive impairment, so stated: Secondary | ICD-10-CM | POA: Diagnosis not present

## 2018-12-04 DIAGNOSIS — Z853 Personal history of malignant neoplasm of breast: Secondary | ICD-10-CM | POA: Diagnosis not present

## 2018-12-04 DIAGNOSIS — I10 Essential (primary) hypertension: Secondary | ICD-10-CM | POA: Diagnosis not present

## 2018-12-04 DIAGNOSIS — Z8673 Personal history of transient ischemic attack (TIA), and cerebral infarction without residual deficits: Secondary | ICD-10-CM | POA: Diagnosis not present

## 2018-12-04 DIAGNOSIS — Z9181 History of falling: Secondary | ICD-10-CM | POA: Diagnosis not present

## 2018-12-04 DIAGNOSIS — R569 Unspecified convulsions: Secondary | ICD-10-CM | POA: Diagnosis not present

## 2018-12-04 DIAGNOSIS — Z7901 Long term (current) use of anticoagulants: Secondary | ICD-10-CM | POA: Diagnosis not present

## 2018-12-04 DIAGNOSIS — I480 Paroxysmal atrial fibrillation: Secondary | ICD-10-CM | POA: Diagnosis not present

## 2018-12-05 DIAGNOSIS — Z9181 History of falling: Secondary | ICD-10-CM | POA: Diagnosis not present

## 2018-12-05 DIAGNOSIS — I10 Essential (primary) hypertension: Secondary | ICD-10-CM | POA: Diagnosis not present

## 2018-12-05 DIAGNOSIS — I480 Paroxysmal atrial fibrillation: Secondary | ICD-10-CM | POA: Diagnosis not present

## 2018-12-05 DIAGNOSIS — Z853 Personal history of malignant neoplasm of breast: Secondary | ICD-10-CM | POA: Diagnosis not present

## 2018-12-05 DIAGNOSIS — S42301D Unspecified fracture of shaft of humerus, right arm, subsequent encounter for fracture with routine healing: Secondary | ICD-10-CM | POA: Diagnosis not present

## 2018-12-05 DIAGNOSIS — Z7901 Long term (current) use of anticoagulants: Secondary | ICD-10-CM | POA: Diagnosis not present

## 2018-12-05 DIAGNOSIS — Z8673 Personal history of transient ischemic attack (TIA), and cerebral infarction without residual deficits: Secondary | ICD-10-CM | POA: Diagnosis not present

## 2018-12-05 DIAGNOSIS — G3184 Mild cognitive impairment, so stated: Secondary | ICD-10-CM | POA: Diagnosis not present

## 2018-12-05 DIAGNOSIS — R569 Unspecified convulsions: Secondary | ICD-10-CM | POA: Diagnosis not present

## 2018-12-09 DIAGNOSIS — G3184 Mild cognitive impairment, so stated: Secondary | ICD-10-CM | POA: Diagnosis not present

## 2018-12-09 DIAGNOSIS — I10 Essential (primary) hypertension: Secondary | ICD-10-CM | POA: Diagnosis not present

## 2018-12-09 DIAGNOSIS — Z9181 History of falling: Secondary | ICD-10-CM | POA: Diagnosis not present

## 2018-12-09 DIAGNOSIS — Z853 Personal history of malignant neoplasm of breast: Secondary | ICD-10-CM | POA: Diagnosis not present

## 2018-12-09 DIAGNOSIS — R569 Unspecified convulsions: Secondary | ICD-10-CM | POA: Diagnosis not present

## 2018-12-09 DIAGNOSIS — I480 Paroxysmal atrial fibrillation: Secondary | ICD-10-CM | POA: Diagnosis not present

## 2018-12-09 DIAGNOSIS — Z7901 Long term (current) use of anticoagulants: Secondary | ICD-10-CM | POA: Diagnosis not present

## 2018-12-09 DIAGNOSIS — S42301D Unspecified fracture of shaft of humerus, right arm, subsequent encounter for fracture with routine healing: Secondary | ICD-10-CM | POA: Diagnosis not present

## 2018-12-09 DIAGNOSIS — Z8673 Personal history of transient ischemic attack (TIA), and cerebral infarction without residual deficits: Secondary | ICD-10-CM | POA: Diagnosis not present

## 2018-12-11 DIAGNOSIS — Z7901 Long term (current) use of anticoagulants: Secondary | ICD-10-CM | POA: Diagnosis not present

## 2018-12-11 DIAGNOSIS — Z853 Personal history of malignant neoplasm of breast: Secondary | ICD-10-CM | POA: Diagnosis not present

## 2018-12-11 DIAGNOSIS — I10 Essential (primary) hypertension: Secondary | ICD-10-CM | POA: Diagnosis not present

## 2018-12-11 DIAGNOSIS — Z9181 History of falling: Secondary | ICD-10-CM | POA: Diagnosis not present

## 2018-12-11 DIAGNOSIS — G3184 Mild cognitive impairment, so stated: Secondary | ICD-10-CM | POA: Diagnosis not present

## 2018-12-11 DIAGNOSIS — Z8673 Personal history of transient ischemic attack (TIA), and cerebral infarction without residual deficits: Secondary | ICD-10-CM | POA: Diagnosis not present

## 2018-12-11 DIAGNOSIS — I480 Paroxysmal atrial fibrillation: Secondary | ICD-10-CM | POA: Diagnosis not present

## 2018-12-11 DIAGNOSIS — R569 Unspecified convulsions: Secondary | ICD-10-CM | POA: Diagnosis not present

## 2018-12-11 DIAGNOSIS — S42301D Unspecified fracture of shaft of humerus, right arm, subsequent encounter for fracture with routine healing: Secondary | ICD-10-CM | POA: Diagnosis not present

## 2018-12-18 ENCOUNTER — Ambulatory Visit (INDEPENDENT_AMBULATORY_CARE_PROVIDER_SITE_OTHER): Payer: Medicare HMO | Admitting: Family Medicine

## 2018-12-18 ENCOUNTER — Encounter: Payer: Self-pay | Admitting: Family Medicine

## 2018-12-18 ENCOUNTER — Ambulatory Visit: Payer: Self-pay

## 2018-12-18 ENCOUNTER — Other Ambulatory Visit: Payer: Self-pay

## 2018-12-18 DIAGNOSIS — R569 Unspecified convulsions: Secondary | ICD-10-CM | POA: Diagnosis not present

## 2018-12-18 DIAGNOSIS — G3184 Mild cognitive impairment, so stated: Secondary | ICD-10-CM | POA: Diagnosis not present

## 2018-12-18 DIAGNOSIS — Z853 Personal history of malignant neoplasm of breast: Secondary | ICD-10-CM | POA: Diagnosis not present

## 2018-12-18 DIAGNOSIS — Z7901 Long term (current) use of anticoagulants: Secondary | ICD-10-CM | POA: Diagnosis not present

## 2018-12-18 DIAGNOSIS — Z8673 Personal history of transient ischemic attack (TIA), and cerebral infarction without residual deficits: Secondary | ICD-10-CM | POA: Diagnosis not present

## 2018-12-18 DIAGNOSIS — I480 Paroxysmal atrial fibrillation: Secondary | ICD-10-CM | POA: Diagnosis not present

## 2018-12-18 DIAGNOSIS — L89159 Pressure ulcer of sacral region, unspecified stage: Secondary | ICD-10-CM | POA: Diagnosis not present

## 2018-12-18 DIAGNOSIS — Z9181 History of falling: Secondary | ICD-10-CM | POA: Diagnosis not present

## 2018-12-18 DIAGNOSIS — I10 Essential (primary) hypertension: Secondary | ICD-10-CM | POA: Diagnosis not present

## 2018-12-18 DIAGNOSIS — S42301D Unspecified fracture of shaft of humerus, right arm, subsequent encounter for fracture with routine healing: Secondary | ICD-10-CM | POA: Diagnosis not present

## 2018-12-18 NOTE — Telephone Encounter (Signed)
Ann from [facility] called from pt's home reported pt has a presumed pressure ulcer to the inner left buttock. Pt stated it has been present for 2 weeks. Lelon Frohlich stated that the ulcer is the size of a pencil eraser and the edges are red. Wound is superficial and the wound base is red. No drainage. Wound is causing moderate pain and is interfering with pt's sleep. No fever or red streak.  Nurse given care advice and verbalized understanding. Due to pain, transferred call to office to set up appt.  Reason for Disposition . 1 or 2 small sores  Answer Assessment - Initial Assessment Questions 1. APPEARANCE of SORES: "What do the sores look like?"     Look raw edges 2. NUMBER: "How many sores are there?"     1 3. SIZE: "How big is the largest sore?"     Head of eraser on pencil 0.5 cm by 0.5 cm round pink wound base no necrotic or purulent drainage- edge- superficial 4. LOCATION: "Where are the sores located?"     On left side of buttocks buried inside and rubs against the other buttock 5. ONSET: "When did the sores begin?"     2 weeks ago 6. CAUSE: "What do you think is causing the sores?"     Pressure wound-very painful gnawing pain -interfering with sleep 7. OTHER SYMPTOMS: "Do you have any other symptoms?" (e.g., fever, new weakness)     decreased appetite- rates pain a 6 out of 10.  Protocols used: [facility]

## 2018-12-18 NOTE — Progress Notes (Signed)
[place] at [facility] [address], [place], [place] [address] [phone]  Date:  12/18/2018   Name:  Carol [name]   DOB:  [birth year]   MRN:  [id number]  PCP:  Darreld Mclean, MD    Chief Complaint: No chief complaint on file.   History of Present Illness:  Carol [name] is a 85 y.o. very pleasant female patient who presents with the following:  Virtual visit today Pt location is home Provider location is home Patient identity confirmed by name and date of birth, she gives consent for virtual visit today  We did a virtual visit about 1 month ago.  Carol [name] fell and broke her right arm on March 10.  She then returned to the ER on March 16 with possible seizure thought due to tramadol.  She was also given Augmentin for questionable pneumonitis on her chest x-ray At our last visit, she had been changed oxycodone for pain which was working fine She was in rate controlled atrial fibrillation at her last ER visit.  She is taking Eliquis, losartan, Aricept  Reviewed orthopedics note from April 3.  She was noted to have a right shoulder proximal humerus fracture.  Using a sling at that time.  She had minor worsening displacement of fracture at that time, they discussed doing surgery if things got worse.  For the time being continuing conservative management  Call today due to a possible bed sore Pt and her caregiver [name] are on the call today -[name] notes that Carol [name] has a small sacral decub ulcer that has been present the last 2 weeks.  They have not been able to get it to resolve.  Her caregiver wants to be sure we are doing everything necessary Otherwise Carol [name] is feeling just fine  No cough or fever   Patient Active Problem List   Diagnosis Date Noted  . Paroxysmal atrial fibrillation ([place]) 08/13/2018  . Right knee pain 11/16/2017  . Osteopenia 11/12/2016  . Mild cognitive disorder 10/10/2016  . Chronic anticoagulation  10/04/2016  . History of atrial fibrillation 10/04/2016  . Essential hypertension, benign 09/01/2016  . Paroxysmal supraventricular tachycardia ([place]) 09/01/2016  . Hyperlipidemia 09/01/2016    Past Medical History:  Diagnosis Date  . A-fib ([employer])   . Anemia   . Arrhythmia   . Blood transfusion without reported diagnosis   . Breast cancer ([place])   . Cancer ([place])   . Cataract   . CHF (congestive heart failure) ([place])   . Dyslipidemia   . Hyperlipidemia   . Hypertension   . Paroxysmal supraventricular tachycardia ([place])   . Stroke [facility])     Past Surgical History:  Procedure Laterality Date  . MASTECTOMY    . TONSILLECTOMY    . WRIST SURGERY      Social History   Tobacco Use  . Smoking status: Never Smoker  . Smokeless tobacco: Never Used  Substance Use Topics  . Alcohol use: No  . Drug use: No    Family History  Problem Relation Age of Onset  . Epilepsy Mother   . Stroke Father   . Heart disease Maternal Aunt     Allergies  Allergen Reactions  . Sertraline Other (See Comments)  . Zoloft [Sertraline Hcl]     headache    Medication list has been reviewed and updated.  Current Outpatient Medications on File Prior to Visit  Medication Sig Dispense Refill  . apixaban (ELIQUIS) 5  MG TABS tablet Take 1 tablet (5 mg total) by mouth 2 (two) times daily. 180 tablet 3  . donepezil (ARICEPT) 10 MG tablet Take 1 tablet (10 mg total) by mouth at bedtime. 90 tablet 4  . losartan (COZAAR) 50 MG tablet Take 1 tablet (50 mg total) by mouth daily. 90 tablet 3   No current facility-administered medications on file prior to visit.     Review of Systems:  As per HPI- otherwise negative.   Physical Examination: There were no vitals filed for this visit. There were no vitals filed for this visit. There is no height or weight on file to calculate BMI. Ideal Body Weight:    Spoke with patient over telephone today.  She sounds well-no cough or distress is  apparent  Assessment and Plan: Pressure injury of skin of sacral region, unspecified injury stage  Phone call from patient and her home health caregiver today regarding possible sacral decub ulcer.  This really needs to be seen in person.  Patient is well, no symptoms of COVID-19.  We made plans for her to be seen in the office tomorrow at 11 AM I called [name] her brother at [phone]-he is aware of appointment and will bring her in  I spoke with patient for less than 10 minutes today Signed Lamar Blinks, MD

## 2018-12-19 ENCOUNTER — Ambulatory Visit (INDEPENDENT_AMBULATORY_CARE_PROVIDER_SITE_OTHER): Payer: Medicare HMO | Admitting: Family Medicine

## 2018-12-19 ENCOUNTER — Other Ambulatory Visit: Payer: Self-pay

## 2018-12-19 ENCOUNTER — Encounter: Payer: Self-pay | Admitting: Family Medicine

## 2018-12-19 VITALS — BP 112/62 | HR 79 | Temp 97.9°F | Resp 16 | Ht 61.0 in | Wt 141.0 lb

## 2018-12-19 DIAGNOSIS — L89152 Pressure ulcer of sacral region, stage 2: Secondary | ICD-10-CM

## 2018-12-19 NOTE — Progress Notes (Signed)
[place] at [facility] [address], [place], [place] [address] [phone]  Date:  12/19/2018   Name:  Carol [name]   DOB:  [birth year]   MRN:  [id number]  PCP:  Darreld Mclean, MD    Chief Complaint: Pressure Sore   History of Present Illness:  Carol [name] is a 85 y.o. very pleasant female patient who presents with the following:  In person visit today due to concern of possible bedsore History of atrial fib on Eliquis and losartan.  Also memory loss, she takes Aricept Carol [name] fell and broke her right proximal humerus on March 10, she was then in the ER about a week later with possible seizure activity thought due to tramadol use.  I spoke with her caregiver yesterday, and they are concerned about skin breakdown on her behind.  I asked her to come in so I can examine her  Her orthopedist is Dr. Davina Poke with [facility].  She saw him most recently on 4/3- she has an appt on 5/1 if she recalls correctly  She has noted a "spot" on her behind for about 2 weeks It is sore but not painful She feels like it is getting better but her caregiver was still concerned   Mention a tetanus booster -pt feels certain that she had one in the last 5 years or so, not sure the exact date  Patient Active Problem List   Diagnosis Date Noted  . Paroxysmal atrial fibrillation ([place]) 08/13/2018  . Right knee pain 11/16/2017  . Osteopenia 11/12/2016  . Mild cognitive disorder 10/10/2016  . Chronic anticoagulation 10/04/2016  . History of atrial fibrillation 10/04/2016  . Essential hypertension, benign 09/01/2016  . Paroxysmal supraventricular tachycardia ([place]) 09/01/2016  . Hyperlipidemia 09/01/2016    Past Medical History:  Diagnosis Date  . A-fib ([place])   . Anemia   . Arrhythmia   . Blood transfusion without reported diagnosis   . Breast cancer ([place])   . Cancer ([place])   . Cataract   . CHF (congestive heart failure) ([place])   .  Dyslipidemia   . Hyperlipidemia   . Hypertension   . Paroxysmal supraventricular tachycardia ([place])   . Stroke [facility])     Past Surgical History:  Procedure Laterality Date  . MASTECTOMY    . TONSILLECTOMY    . WRIST SURGERY      Social History   Tobacco Use  . Smoking status: Never Smoker  . Smokeless tobacco: Never Used  Substance Use Topics  . Alcohol use: No  . Drug use: No    Family History  Problem Relation Age of Onset  . Epilepsy Mother   . Stroke Father   . Heart disease Maternal Aunt     Allergies  Allergen Reactions  . Tramadol Other (See Comments)    seizure  . Sertraline Other (See Comments)  . Zoloft [Sertraline Hcl]     headache    Medication list has been reviewed and updated.  Current Outpatient Medications on File Prior to Visit  Medication Sig Dispense Refill  . apixaban (ELIQUIS) 5 MG TABS tablet Take 1 tablet (5 mg total) by mouth 2 (two) times daily. 180 tablet 3  . donepezil (ARICEPT) 10 MG tablet Take 1 tablet (10 mg total) by mouth at bedtime. 90 tablet 4  . losartan (COZAAR) 50 MG tablet Take 1 tablet (50 mg total) by mouth daily. 90 tablet 3   No current facility-administered  medications on file prior to visit.     Review of Systems:  As per HPI- otherwise negative.   Physical Examination: Vitals:   12/19/18 1059  BP: 112/62  Pulse: 79  Resp: 16  Temp: 97.9 F (36.6 C)  SpO2: 99%   Vitals:   12/19/18 1059  Weight: 141 lb (64 kg)  Height: 5\' 1"  (1.549 m)   Body mass index is 26.64 kg/m. Ideal Body Weight: Weight in (lb) to have BMI = 25: 132  GEN: WDWN, NAD, Non-toxic, A & O x 3 well-appearing elderly lady.  She is seen in the office alone today HEENT: Atraumatic, Normocephalic. Neck supple. No masses, No LAD. Ears and Nose: No external deformity. CV: RRR, No M/G/R. No JVD. No thrill. No extra heart sounds.  Seems to be in sinus rhythm today PULM: CTA B, no wheezes, crackles, rhonchi. No retractions. No resp.  distress. No accessory muscle use. EXTR: No c/c/e NEURO Normal gait.  PSYCH: Normally interactive. Conversant. Not depressed or anxious appearing.  Calm demeanor.  She has mild irritation of the skin in the gluteal cleft bilaterally.  May be due to moisture and/or pressure.  On the left buttock there is an approximately 8 mm diameter mild skin ulcer. The skin is clean and dry  I cut to size and applied gel dressing to the ulcer on the left buttock  BP Readings from Last 3 Encounters:  12/19/18 112/62  09/11/18 (!) 142/77  09/02/18 (!) 142/70    Assessment and Plan: Pressure injury of sacral region, stage 2 ([place])  Carol [name] is seen today with erythema of the gluteal cleft, and a small stage II ulcer of the left buttock.  I have applied a gel dressing and given her several others to take home.  Her caregiver is not present in the room today due to pandemic, but I have written out detailed instructions for her to take home-see below  Asked patient to alert me if not improving over the next week  Patient instructions: You have a small area of skin breakdown on the left buttock and some more generalized skin irritation in your "bottom" area Use a piece of gel dressing to cover the sore on the left side.  You can cut a piece of the dressing to fit the area, and can leave it on for a few days if still clean.  Change as often as needed if dirty   You can order more of these gel dressings online if needed  Clean your skin by washing with water and mild soap, pat dry gently but completly.  A hair dryer on cool setting can be used to get the skin very dry!   Overall, try to keep the skin in this area clean, cool and dry.  I would also suggest that you apply an OTC barrier/ diaper cream such as Desitin to the larger bottom area- this will protect the skin and hopefully help it to heal  If your skin is not healing/ improving over the next week or so please alert me- Sooner if worse.   Signed Lamar Blinks, MD

## 2018-12-19 NOTE — Patient Instructions (Signed)
You have a small area of skin breakdown on the left buttock and some more generalized skin irritation in your "bottom" area Use a piece of gel dressing to cover the sore on the left side.  You can cut a piece of the dressing to fit the area, and can leave it on for a few days if still clean.  Change as often as needed if dirty   You can order more of these gel dressings online if needed  Clean your skin by washing with water and mild soap, pat dry gently but completly.  A hair dryer on cool setting can be used to get the skin very dry!   Overall, try to keep the skin in this area clean, cool and dry.  I would also suggest that you apply an OTC barrier/ diaper cream such as Desitin to the larger bottom area- this will protect the skin and hopefully help it to heal  If your skin is not healing/ improving over the next week or so please alert me- Sooner if worse.

## 2018-12-23 DIAGNOSIS — G3184 Mild cognitive impairment, so stated: Secondary | ICD-10-CM | POA: Diagnosis not present

## 2018-12-23 DIAGNOSIS — S42301D Unspecified fracture of shaft of humerus, right arm, subsequent encounter for fracture with routine healing: Secondary | ICD-10-CM | POA: Diagnosis not present

## 2018-12-23 DIAGNOSIS — I480 Paroxysmal atrial fibrillation: Secondary | ICD-10-CM | POA: Diagnosis not present

## 2018-12-23 DIAGNOSIS — Z8673 Personal history of transient ischemic attack (TIA), and cerebral infarction without residual deficits: Secondary | ICD-10-CM | POA: Diagnosis not present

## 2018-12-23 DIAGNOSIS — R569 Unspecified convulsions: Secondary | ICD-10-CM | POA: Diagnosis not present

## 2018-12-23 DIAGNOSIS — Z7901 Long term (current) use of anticoagulants: Secondary | ICD-10-CM | POA: Diagnosis not present

## 2018-12-23 DIAGNOSIS — Z853 Personal history of malignant neoplasm of breast: Secondary | ICD-10-CM | POA: Diagnosis not present

## 2018-12-23 DIAGNOSIS — Z9181 History of falling: Secondary | ICD-10-CM | POA: Diagnosis not present

## 2018-12-23 DIAGNOSIS — I10 Essential (primary) hypertension: Secondary | ICD-10-CM | POA: Diagnosis not present

## 2018-12-27 ENCOUNTER — Telehealth: Payer: Self-pay | Admitting: *Deleted

## 2018-12-27 DIAGNOSIS — S42291A Other displaced fracture of upper end of right humerus, initial encounter for closed fracture: Secondary | ICD-10-CM | POA: Diagnosis not present

## 2018-12-27 NOTE — Telephone Encounter (Signed)
Received Physician Orders from [facility]; forwarded to provider/SLS 05/01

## 2018-12-31 DIAGNOSIS — G3184 Mild cognitive impairment, so stated: Secondary | ICD-10-CM | POA: Diagnosis not present

## 2018-12-31 DIAGNOSIS — Z8673 Personal history of transient ischemic attack (TIA), and cerebral infarction without residual deficits: Secondary | ICD-10-CM | POA: Diagnosis not present

## 2018-12-31 DIAGNOSIS — R569 Unspecified convulsions: Secondary | ICD-10-CM | POA: Diagnosis not present

## 2018-12-31 DIAGNOSIS — Z9181 History of falling: Secondary | ICD-10-CM | POA: Diagnosis not present

## 2018-12-31 DIAGNOSIS — Z853 Personal history of malignant neoplasm of breast: Secondary | ICD-10-CM | POA: Diagnosis not present

## 2018-12-31 DIAGNOSIS — Z7901 Long term (current) use of anticoagulants: Secondary | ICD-10-CM | POA: Diagnosis not present

## 2018-12-31 DIAGNOSIS — I10 Essential (primary) hypertension: Secondary | ICD-10-CM | POA: Diagnosis not present

## 2018-12-31 DIAGNOSIS — I480 Paroxysmal atrial fibrillation: Secondary | ICD-10-CM | POA: Diagnosis not present

## 2018-12-31 DIAGNOSIS — S42301D Unspecified fracture of shaft of humerus, right arm, subsequent encounter for fracture with routine healing: Secondary | ICD-10-CM | POA: Diagnosis not present

## 2019-01-01 ENCOUNTER — Telehealth: Payer: Self-pay | Admitting: Family Medicine

## 2019-01-01 NOTE — Telephone Encounter (Signed)
Verbal orders given via voice-mail.

## 2019-01-01 NOTE — Telephone Encounter (Signed)
Copied from [place] [id number]. Topic: Quick Communication - Home Health Verbal Orders >> Jan 01, 2019 10:53 AM Sheran Luz wrote: Caller/Agency: [place] Number: [phone] Requesting OT Frequency: 1w2

## 2019-01-02 DIAGNOSIS — Z7901 Long term (current) use of anticoagulants: Secondary | ICD-10-CM | POA: Diagnosis not present

## 2019-01-02 DIAGNOSIS — Z9181 History of falling: Secondary | ICD-10-CM | POA: Diagnosis not present

## 2019-01-02 DIAGNOSIS — R569 Unspecified convulsions: Secondary | ICD-10-CM | POA: Diagnosis not present

## 2019-01-02 DIAGNOSIS — S42301D Unspecified fracture of shaft of humerus, right arm, subsequent encounter for fracture with routine healing: Secondary | ICD-10-CM | POA: Diagnosis not present

## 2019-01-02 DIAGNOSIS — G3184 Mild cognitive impairment, so stated: Secondary | ICD-10-CM | POA: Diagnosis not present

## 2019-01-02 DIAGNOSIS — Z8673 Personal history of transient ischemic attack (TIA), and cerebral infarction without residual deficits: Secondary | ICD-10-CM | POA: Diagnosis not present

## 2019-01-02 DIAGNOSIS — Z853 Personal history of malignant neoplasm of breast: Secondary | ICD-10-CM | POA: Diagnosis not present

## 2019-01-02 DIAGNOSIS — I10 Essential (primary) hypertension: Secondary | ICD-10-CM | POA: Diagnosis not present

## 2019-01-02 DIAGNOSIS — I480 Paroxysmal atrial fibrillation: Secondary | ICD-10-CM | POA: Diagnosis not present

## 2019-01-03 ENCOUNTER — Telehealth: Payer: Self-pay | Admitting: *Deleted

## 2019-01-03 NOTE — Telephone Encounter (Signed)
Received Physician Orders from [facility]; forwarded to provider/SLS 05/08

## 2019-01-10 DIAGNOSIS — I480 Paroxysmal atrial fibrillation: Secondary | ICD-10-CM | POA: Diagnosis not present

## 2019-01-10 DIAGNOSIS — I10 Essential (primary) hypertension: Secondary | ICD-10-CM | POA: Diagnosis not present

## 2019-01-10 DIAGNOSIS — Z8673 Personal history of transient ischemic attack (TIA), and cerebral infarction without residual deficits: Secondary | ICD-10-CM | POA: Diagnosis not present

## 2019-01-10 DIAGNOSIS — R569 Unspecified convulsions: Secondary | ICD-10-CM | POA: Diagnosis not present

## 2019-01-10 DIAGNOSIS — G3184 Mild cognitive impairment, so stated: Secondary | ICD-10-CM | POA: Diagnosis not present

## 2019-01-10 DIAGNOSIS — Z7901 Long term (current) use of anticoagulants: Secondary | ICD-10-CM | POA: Diagnosis not present

## 2019-01-10 DIAGNOSIS — S42301D Unspecified fracture of shaft of humerus, right arm, subsequent encounter for fracture with routine healing: Secondary | ICD-10-CM | POA: Diagnosis not present

## 2019-01-10 DIAGNOSIS — Z853 Personal history of malignant neoplasm of breast: Secondary | ICD-10-CM | POA: Diagnosis not present

## 2019-01-10 DIAGNOSIS — Z9181 History of falling: Secondary | ICD-10-CM | POA: Diagnosis not present

## 2019-01-13 ENCOUNTER — Telehealth: Payer: Self-pay | Admitting: Family Medicine

## 2019-01-13 NOTE — Telephone Encounter (Signed)
LMOM verbal orders given.

## 2019-01-13 NOTE — Telephone Encounter (Signed)
Copied from [place] [id number]. Topic: Quick Communication - Home Health Verbal Orders >> Jan 13, 2019 10:12 AM Carolyn Stare wrote: Caller/Agency Renee with [facility] Number [phone]  Requesting verbal OT  Frequency: 2 x  2     1 x 4

## 2019-01-14 DIAGNOSIS — S42301D Unspecified fracture of shaft of humerus, right arm, subsequent encounter for fracture with routine healing: Secondary | ICD-10-CM | POA: Diagnosis not present

## 2019-01-14 DIAGNOSIS — Z853 Personal history of malignant neoplasm of breast: Secondary | ICD-10-CM | POA: Diagnosis not present

## 2019-01-14 DIAGNOSIS — R569 Unspecified convulsions: Secondary | ICD-10-CM | POA: Diagnosis not present

## 2019-01-14 DIAGNOSIS — Z7901 Long term (current) use of anticoagulants: Secondary | ICD-10-CM | POA: Diagnosis not present

## 2019-01-14 DIAGNOSIS — I10 Essential (primary) hypertension: Secondary | ICD-10-CM | POA: Diagnosis not present

## 2019-01-14 DIAGNOSIS — Z9181 History of falling: Secondary | ICD-10-CM | POA: Diagnosis not present

## 2019-01-14 DIAGNOSIS — G3184 Mild cognitive impairment, so stated: Secondary | ICD-10-CM | POA: Diagnosis not present

## 2019-01-14 DIAGNOSIS — Z8673 Personal history of transient ischemic attack (TIA), and cerebral infarction without residual deficits: Secondary | ICD-10-CM | POA: Diagnosis not present

## 2019-01-14 DIAGNOSIS — I480 Paroxysmal atrial fibrillation: Secondary | ICD-10-CM | POA: Diagnosis not present

## 2019-01-17 DIAGNOSIS — I480 Paroxysmal atrial fibrillation: Secondary | ICD-10-CM | POA: Diagnosis not present

## 2019-01-17 DIAGNOSIS — I10 Essential (primary) hypertension: Secondary | ICD-10-CM | POA: Diagnosis not present

## 2019-01-17 DIAGNOSIS — Z8673 Personal history of transient ischemic attack (TIA), and cerebral infarction without residual deficits: Secondary | ICD-10-CM | POA: Diagnosis not present

## 2019-01-17 DIAGNOSIS — R569 Unspecified convulsions: Secondary | ICD-10-CM | POA: Diagnosis not present

## 2019-01-17 DIAGNOSIS — Z9181 History of falling: Secondary | ICD-10-CM | POA: Diagnosis not present

## 2019-01-17 DIAGNOSIS — S42301D Unspecified fracture of shaft of humerus, right arm, subsequent encounter for fracture with routine healing: Secondary | ICD-10-CM | POA: Diagnosis not present

## 2019-01-17 DIAGNOSIS — Z7901 Long term (current) use of anticoagulants: Secondary | ICD-10-CM | POA: Diagnosis not present

## 2019-01-17 DIAGNOSIS — G3184 Mild cognitive impairment, so stated: Secondary | ICD-10-CM | POA: Diagnosis not present

## 2019-01-17 DIAGNOSIS — Z853 Personal history of malignant neoplasm of breast: Secondary | ICD-10-CM | POA: Diagnosis not present

## 2019-01-21 DIAGNOSIS — R569 Unspecified convulsions: Secondary | ICD-10-CM | POA: Diagnosis not present

## 2019-01-21 DIAGNOSIS — Z8673 Personal history of transient ischemic attack (TIA), and cerebral infarction without residual deficits: Secondary | ICD-10-CM | POA: Diagnosis not present

## 2019-01-21 DIAGNOSIS — I10 Essential (primary) hypertension: Secondary | ICD-10-CM | POA: Diagnosis not present

## 2019-01-21 DIAGNOSIS — G3184 Mild cognitive impairment, so stated: Secondary | ICD-10-CM | POA: Diagnosis not present

## 2019-01-21 DIAGNOSIS — I480 Paroxysmal atrial fibrillation: Secondary | ICD-10-CM | POA: Diagnosis not present

## 2019-01-21 DIAGNOSIS — Z9181 History of falling: Secondary | ICD-10-CM | POA: Diagnosis not present

## 2019-01-21 DIAGNOSIS — Z853 Personal history of malignant neoplasm of breast: Secondary | ICD-10-CM | POA: Diagnosis not present

## 2019-01-21 DIAGNOSIS — S42301D Unspecified fracture of shaft of humerus, right arm, subsequent encounter for fracture with routine healing: Secondary | ICD-10-CM | POA: Diagnosis not present

## 2019-01-21 DIAGNOSIS — Z7901 Long term (current) use of anticoagulants: Secondary | ICD-10-CM | POA: Diagnosis not present

## 2019-01-24 DIAGNOSIS — I10 Essential (primary) hypertension: Secondary | ICD-10-CM | POA: Diagnosis not present

## 2019-01-24 DIAGNOSIS — Z8673 Personal history of transient ischemic attack (TIA), and cerebral infarction without residual deficits: Secondary | ICD-10-CM | POA: Diagnosis not present

## 2019-01-24 DIAGNOSIS — Z9181 History of falling: Secondary | ICD-10-CM | POA: Diagnosis not present

## 2019-01-24 DIAGNOSIS — I480 Paroxysmal atrial fibrillation: Secondary | ICD-10-CM | POA: Diagnosis not present

## 2019-01-24 DIAGNOSIS — G3184 Mild cognitive impairment, so stated: Secondary | ICD-10-CM | POA: Diagnosis not present

## 2019-01-24 DIAGNOSIS — Z853 Personal history of malignant neoplasm of breast: Secondary | ICD-10-CM | POA: Diagnosis not present

## 2019-01-24 DIAGNOSIS — R569 Unspecified convulsions: Secondary | ICD-10-CM | POA: Diagnosis not present

## 2019-01-24 DIAGNOSIS — Z7901 Long term (current) use of anticoagulants: Secondary | ICD-10-CM | POA: Diagnosis not present

## 2019-01-24 DIAGNOSIS — S42301D Unspecified fracture of shaft of humerus, right arm, subsequent encounter for fracture with routine healing: Secondary | ICD-10-CM | POA: Diagnosis not present

## 2019-01-28 ENCOUNTER — Telehealth: Payer: Medicare HMO | Admitting: Nurse Practitioner

## 2019-01-30 DIAGNOSIS — R569 Unspecified convulsions: Secondary | ICD-10-CM | POA: Diagnosis not present

## 2019-01-30 DIAGNOSIS — S42301D Unspecified fracture of shaft of humerus, right arm, subsequent encounter for fracture with routine healing: Secondary | ICD-10-CM | POA: Diagnosis not present

## 2019-01-30 DIAGNOSIS — Z7901 Long term (current) use of anticoagulants: Secondary | ICD-10-CM | POA: Diagnosis not present

## 2019-01-30 DIAGNOSIS — Z8673 Personal history of transient ischemic attack (TIA), and cerebral infarction without residual deficits: Secondary | ICD-10-CM | POA: Diagnosis not present

## 2019-01-30 DIAGNOSIS — I10 Essential (primary) hypertension: Secondary | ICD-10-CM | POA: Diagnosis not present

## 2019-01-30 DIAGNOSIS — G3184 Mild cognitive impairment, so stated: Secondary | ICD-10-CM | POA: Diagnosis not present

## 2019-01-30 DIAGNOSIS — I480 Paroxysmal atrial fibrillation: Secondary | ICD-10-CM | POA: Diagnosis not present

## 2019-01-30 DIAGNOSIS — Z853 Personal history of malignant neoplasm of breast: Secondary | ICD-10-CM | POA: Diagnosis not present

## 2019-01-30 DIAGNOSIS — Z9181 History of falling: Secondary | ICD-10-CM | POA: Diagnosis not present

## 2019-02-01 ENCOUNTER — Other Ambulatory Visit: Payer: Self-pay | Admitting: Cardiology

## 2019-02-06 DIAGNOSIS — Z853 Personal history of malignant neoplasm of breast: Secondary | ICD-10-CM | POA: Diagnosis not present

## 2019-02-06 DIAGNOSIS — R569 Unspecified convulsions: Secondary | ICD-10-CM | POA: Diagnosis not present

## 2019-02-06 DIAGNOSIS — S42301D Unspecified fracture of shaft of humerus, right arm, subsequent encounter for fracture with routine healing: Secondary | ICD-10-CM | POA: Diagnosis not present

## 2019-02-06 DIAGNOSIS — Z8673 Personal history of transient ischemic attack (TIA), and cerebral infarction without residual deficits: Secondary | ICD-10-CM | POA: Diagnosis not present

## 2019-02-06 DIAGNOSIS — G3184 Mild cognitive impairment, so stated: Secondary | ICD-10-CM | POA: Diagnosis not present

## 2019-02-06 DIAGNOSIS — Z7901 Long term (current) use of anticoagulants: Secondary | ICD-10-CM | POA: Diagnosis not present

## 2019-02-06 DIAGNOSIS — I480 Paroxysmal atrial fibrillation: Secondary | ICD-10-CM | POA: Diagnosis not present

## 2019-02-06 DIAGNOSIS — I10 Essential (primary) hypertension: Secondary | ICD-10-CM | POA: Diagnosis not present

## 2019-02-06 DIAGNOSIS — Z9181 History of falling: Secondary | ICD-10-CM | POA: Diagnosis not present

## 2019-02-10 ENCOUNTER — Ambulatory Visit: Payer: Medicare HMO | Admitting: Nurse Practitioner

## 2019-02-13 DIAGNOSIS — S42301D Unspecified fracture of shaft of humerus, right arm, subsequent encounter for fracture with routine healing: Secondary | ICD-10-CM | POA: Diagnosis not present

## 2019-02-13 DIAGNOSIS — Z7901 Long term (current) use of anticoagulants: Secondary | ICD-10-CM | POA: Diagnosis not present

## 2019-02-13 DIAGNOSIS — G3184 Mild cognitive impairment, so stated: Secondary | ICD-10-CM | POA: Diagnosis not present

## 2019-02-13 DIAGNOSIS — Z9181 History of falling: Secondary | ICD-10-CM | POA: Diagnosis not present

## 2019-02-13 DIAGNOSIS — I480 Paroxysmal atrial fibrillation: Secondary | ICD-10-CM | POA: Diagnosis not present

## 2019-02-13 DIAGNOSIS — R569 Unspecified convulsions: Secondary | ICD-10-CM | POA: Diagnosis not present

## 2019-02-13 DIAGNOSIS — Z8673 Personal history of transient ischemic attack (TIA), and cerebral infarction without residual deficits: Secondary | ICD-10-CM | POA: Diagnosis not present

## 2019-02-13 DIAGNOSIS — Z853 Personal history of malignant neoplasm of breast: Secondary | ICD-10-CM | POA: Diagnosis not present

## 2019-02-13 DIAGNOSIS — I10 Essential (primary) hypertension: Secondary | ICD-10-CM | POA: Diagnosis not present

## 2019-02-18 DIAGNOSIS — Z9181 History of falling: Secondary | ICD-10-CM | POA: Diagnosis not present

## 2019-02-18 DIAGNOSIS — S42301D Unspecified fracture of shaft of humerus, right arm, subsequent encounter for fracture with routine healing: Secondary | ICD-10-CM | POA: Diagnosis not present

## 2019-02-18 DIAGNOSIS — G3184 Mild cognitive impairment, so stated: Secondary | ICD-10-CM | POA: Diagnosis not present

## 2019-02-18 DIAGNOSIS — Z7901 Long term (current) use of anticoagulants: Secondary | ICD-10-CM | POA: Diagnosis not present

## 2019-02-18 DIAGNOSIS — I480 Paroxysmal atrial fibrillation: Secondary | ICD-10-CM | POA: Diagnosis not present

## 2019-02-18 DIAGNOSIS — Z8673 Personal history of transient ischemic attack (TIA), and cerebral infarction without residual deficits: Secondary | ICD-10-CM | POA: Diagnosis not present

## 2019-02-18 DIAGNOSIS — Z853 Personal history of malignant neoplasm of breast: Secondary | ICD-10-CM | POA: Diagnosis not present

## 2019-02-18 DIAGNOSIS — R569 Unspecified convulsions: Secondary | ICD-10-CM | POA: Diagnosis not present

## 2019-02-18 DIAGNOSIS — I10 Essential (primary) hypertension: Secondary | ICD-10-CM | POA: Diagnosis not present

## 2019-03-03 ENCOUNTER — Ambulatory Visit: Payer: Medicare HMO | Admitting: Family Medicine

## 2019-03-04 DIAGNOSIS — M25511 Pain in right shoulder: Secondary | ICD-10-CM | POA: Diagnosis not present

## 2019-03-04 DIAGNOSIS — M6259 Muscle wasting and atrophy, not elsewhere classified, multiple sites: Secondary | ICD-10-CM | POA: Diagnosis not present

## 2019-03-04 DIAGNOSIS — S42291A Other displaced fracture of upper end of right humerus, initial encounter for closed fracture: Secondary | ICD-10-CM | POA: Diagnosis not present

## 2019-03-04 DIAGNOSIS — R262 Difficulty in walking, not elsewhere classified: Secondary | ICD-10-CM | POA: Diagnosis not present

## 2019-03-04 DIAGNOSIS — M6281 Muscle weakness (generalized): Secondary | ICD-10-CM | POA: Diagnosis not present

## 2019-03-08 NOTE — Progress Notes (Signed)
[facility] [address] [place], [place] [address] [phone]  Date:  03/10/2019   Name:  Carol [name]   DOB:  04/28/1928   MRN:  [id number]  PCP:  Darreld Mclean, MD    Chief Complaint: Follow-up   History of Present Illness:  Carol [name] is a 85 y.o. very pleasant female patient who presents with the following:  Here today for 11-month follow-up Very nice elderly lady with history of A. Fib, hypertension, hyperlipidemia, cognitive impairment  She is maintained on Eliquis 5 mg twice daily, Aricept, losartan  Last seen by myself in April for concern of possible sacral decub ulcer-at that time she had a small stage II ulcer of the left buttock.  We treated with this with a gel dressing, I asked her to follow-up if not healing  She fell and broke her right arm this past March- gradually getting better, she is still seeing ortho   Her neurologist is Dr. Krista Blue, cardiologist is Dr. Marlou Porch  I got the following message from her SIL Carol [name]:     Hello Dr. Edilia Bo. Hope you are well. Carol [name] has an appointment with you at one. I wanted you to know that short term memory is progressing and that she has an appt with a neurologist 7/31.     She gets all meds from [facility] except Aricept, which seems to be helping her.     Carol [name], is a wonderful lady that administers her meds daily and checks to see that she eats.     She has been seeing Dr Davina Poke, Orthopedist for a broken shoulder due to a fall. She also sees an occupational therapist at the [facility].     She has had a hearing test, has gotten hearing aids but does not like to wear them. Also wanted to ask if she needed a bone density test. Thx so very much. So sorry for this last minute information. I did not realize that my husband would not be able to come in for her visit.   She has been mostly staying home during the pandemic She lives at the  [place], she is still able to see her friends some and spends time with her brother and SIL. Overall she is not isolated and is grateful to have people around her  Her arm is gradually getting better- can still be painful at times but improving  She thinks that she is seeing her ortho in about one month   No CP or SOB She feels like her energy level is good  Overall she feels very well She is active and tries to walk as much as she can   Patient Active Problem List   Diagnosis Date Noted  . Paroxysmal atrial fibrillation ([place]) 08/13/2018  . Right knee pain 11/16/2017  . Osteopenia 11/12/2016  . Mild cognitive disorder 10/10/2016  . Chronic anticoagulation 10/04/2016  . History of atrial fibrillation 10/04/2016  . Essential hypertension, benign 09/01/2016  . Paroxysmal supraventricular tachycardia ([place]) 09/01/2016  . Hyperlipidemia 09/01/2016    Past Medical History:  Diagnosis Date  . A-fib ([place])   . Anemia   . Arrhythmia   . Blood transfusion without reported diagnosis   . Breast cancer ([place])   . Cancer ([place])   . Cataract   . CHF (congestive heart failure) ([place])   . Dyslipidemia   . Hyperlipidemia   . Hypertension   . Paroxysmal  supraventricular tachycardia ([place])   . Stroke [facility])     Past Surgical History:  Procedure Laterality Date  . MASTECTOMY    . TONSILLECTOMY    . WRIST SURGERY      Social History   Tobacco Use  . Smoking status: Never Smoker  . Smokeless tobacco: Never Used  Substance Use Topics  . Alcohol use: No  . Drug use: No    Family History  Problem Relation Age of Onset  . Epilepsy Mother   . Stroke Father   . Heart disease Maternal Aunt     Allergies  Allergen Reactions  . Tramadol Other (See Comments)    seizure  . Sertraline Other (See Comments)  . Zoloft [Sertraline Hcl]     headache    Medication list has been reviewed and updated.  Current Outpatient Medications on File Prior to Visit  Medication Sig Dispense Refill   . apixaban (ELIQUIS) 5 MG TABS tablet Take 1 tablet (5 mg total) by mouth 2 (two) times daily. 180 tablet 3  . donepezil (ARICEPT) 10 MG tablet Take 1 tablet (10 mg total) by mouth at bedtime. 90 tablet 4  . losartan (COZAAR) 50 MG tablet Take 1 tablet (50 mg total) by mouth daily. 90 tablet 3   No current facility-administered medications on file prior to visit.     Review of Systems:  As per HPI- otherwise negative.   Physical Examination: Vitals:   03/10/19 1307  BP: 116/70  Pulse: (!) 108  Resp: 18  Temp: 97.8 F (36.6 C)  SpO2: 95%   Vitals:   03/10/19 1307  Weight: 143 lb (64.9 kg)   Body mass index is 27.02 kg/m. Ideal Body Weight:    GEN: WDWN, NAD, Non-toxic, A & O x 3 HEENT: Atraumatic, Normocephalic. Neck supple. No masses, No LAD. Ears and Nose: No external deformity. CV: in atrial fib with average rate 80- 90 BPM, No M/G/R. No JVD. No thrill. No extra heart sounds. PULM: CTA B, no wheezes, crackles, rhonchi. No retractions. No resp. distress. No accessory muscle use. S/p bilateral mastectomy EXTR: No c/c/e- no swelling NEURO Normal gait.  PSYCH: Normally interactive. Conversant. Not depressed or anxious appearing.  Calm demeanor.  She did her own toenail polish and did quite a good job   BP Readings from Last 3 Encounters:  03/10/19 116/70  12/19/18 112/62  09/11/18 (!) 142/77   Wt Readings from Last 3 Encounters:  03/10/19 143 lb (64.9 kg)  12/19/18 141 lb (64 kg)  09/11/18 147 lb 12 oz (67 kg)    Assessment and Plan:   ICD-10-CM   1. Medication monitoring encounter  Z51.81 CBC    Basic metabolic panel  2. Osteopenia, unspecified location  M85.80 DG Bone Density  3. Estrogen deficiency  E28.39 DG Bone Density  4. Atrial fibrillation, unspecified type ([place])  I48.91   5. Essential hypertension, benign  I10   6. Memory loss  R41.3    Following up today BP under control Taking eliquis for a fib- rate is ok Referral for bone density  scan Suggested shingrix  Memory loss- she is taking aricept, has someone helping her with meds and meals, her brother and SIL are involved and help out a lot   Follow-up: No follow-ups on file.  No orders of the defined types were placed in this encounter.  Orders Placed This Encounter  Procedures  . DG Bone Density  . CBC  . Basic metabolic panel  Suggest Shingrix vaccine Signed Lamar Blinks, MD

## 2019-03-10 ENCOUNTER — Encounter: Payer: Self-pay | Admitting: Family Medicine

## 2019-03-10 ENCOUNTER — Ambulatory Visit (INDEPENDENT_AMBULATORY_CARE_PROVIDER_SITE_OTHER): Payer: Medicare HMO | Admitting: Family Medicine

## 2019-03-10 ENCOUNTER — Other Ambulatory Visit: Payer: Self-pay

## 2019-03-10 ENCOUNTER — Telehealth: Payer: Self-pay

## 2019-03-10 VITALS — BP 116/70 | HR 108 | Temp 97.8°F | Resp 18 | Wt 143.0 lb

## 2019-03-10 DIAGNOSIS — I10 Essential (primary) hypertension: Secondary | ICD-10-CM

## 2019-03-10 DIAGNOSIS — Z5181 Encounter for therapeutic drug level monitoring: Secondary | ICD-10-CM | POA: Diagnosis not present

## 2019-03-10 DIAGNOSIS — E2839 Other primary ovarian failure: Secondary | ICD-10-CM | POA: Diagnosis not present

## 2019-03-10 DIAGNOSIS — M25511 Pain in right shoulder: Secondary | ICD-10-CM | POA: Diagnosis not present

## 2019-03-10 DIAGNOSIS — M8580 Other specified disorders of bone density and structure, unspecified site: Secondary | ICD-10-CM | POA: Diagnosis not present

## 2019-03-10 DIAGNOSIS — R413 Other amnesia: Secondary | ICD-10-CM

## 2019-03-10 DIAGNOSIS — M6259 Muscle wasting and atrophy, not elsewhere classified, multiple sites: Secondary | ICD-10-CM | POA: Diagnosis not present

## 2019-03-10 DIAGNOSIS — R262 Difficulty in walking, not elsewhere classified: Secondary | ICD-10-CM | POA: Diagnosis not present

## 2019-03-10 DIAGNOSIS — I110 Hypertensive heart disease with heart failure: Secondary | ICD-10-CM | POA: Diagnosis not present

## 2019-03-10 DIAGNOSIS — I4891 Unspecified atrial fibrillation: Secondary | ICD-10-CM | POA: Diagnosis not present

## 2019-03-10 DIAGNOSIS — M6281 Muscle weakness (generalized): Secondary | ICD-10-CM | POA: Diagnosis not present

## 2019-03-10 DIAGNOSIS — I509 Heart failure, unspecified: Secondary | ICD-10-CM | POA: Diagnosis not present

## 2019-03-10 NOTE — Patient Instructions (Signed)
Great to see you today- you look wonderful!  I will be in touch with your labs You are in atrial fibrillation today but this is ok- you are taking the blood thinner so you don't get a clot I will set you up for a bone density test Please see me in about 6 months, let me know if any problems or concerns in the meantime I would also suggest that you get the shingles vaccine- Shingrix- at your pharmacy at your convenience

## 2019-03-10 NOTE — Telephone Encounter (Signed)
Copied from [place] [id number]. Topic: General - Other >> Mar 10, 2019 12:36 PM Leward Quan A wrote: Reason for CRM: Patient sister in law Alyson Reedy called to inform Dr Lorelei Pont that patient gets her medication from [facility], has received hearing aids but does not wear them, she have an appointment on 03/28/2019, she have a helper to administer medication daily due to dementia and also taking OT. Just wanted to inform the doctor ahead of time

## 2019-03-11 DIAGNOSIS — M6259 Muscle wasting and atrophy, not elsewhere classified, multiple sites: Secondary | ICD-10-CM | POA: Diagnosis not present

## 2019-03-11 DIAGNOSIS — M6281 Muscle weakness (generalized): Secondary | ICD-10-CM | POA: Diagnosis not present

## 2019-03-11 DIAGNOSIS — R262 Difficulty in walking, not elsewhere classified: Secondary | ICD-10-CM | POA: Diagnosis not present

## 2019-03-11 DIAGNOSIS — M25511 Pain in right shoulder: Secondary | ICD-10-CM | POA: Diagnosis not present

## 2019-03-17 ENCOUNTER — Other Ambulatory Visit: Payer: Self-pay

## 2019-03-17 ENCOUNTER — Ambulatory Visit (HOSPITAL_BASED_OUTPATIENT_CLINIC_OR_DEPARTMENT_OTHER)
Admission: RE | Admit: 2019-03-17 | Discharge: 2019-03-17 | Disposition: A | Discharge status: Home / Self Care | Payer: Medicare HMO | Source: Ambulatory Visit | Attending: Family Medicine | Admitting: Family Medicine

## 2019-03-17 DIAGNOSIS — M6281 Muscle weakness (generalized): Secondary | ICD-10-CM | POA: Diagnosis not present

## 2019-03-17 DIAGNOSIS — M25511 Pain in right shoulder: Secondary | ICD-10-CM | POA: Diagnosis not present

## 2019-03-17 DIAGNOSIS — M8580 Other specified disorders of bone density and structure, unspecified site: Secondary | ICD-10-CM

## 2019-03-17 DIAGNOSIS — E2839 Other primary ovarian failure: Secondary | ICD-10-CM | POA: Diagnosis not present

## 2019-03-17 DIAGNOSIS — M6259 Muscle wasting and atrophy, not elsewhere classified, multiple sites: Secondary | ICD-10-CM | POA: Diagnosis not present

## 2019-03-17 DIAGNOSIS — M85852 Other specified disorders of bone density and structure, left thigh: Secondary | ICD-10-CM | POA: Diagnosis not present

## 2019-03-17 DIAGNOSIS — R262 Difficulty in walking, not elsewhere classified: Secondary | ICD-10-CM | POA: Diagnosis not present

## 2019-03-18 ENCOUNTER — Encounter: Payer: Self-pay | Admitting: Family Medicine

## 2019-03-18 DIAGNOSIS — M6259 Muscle wasting and atrophy, not elsewhere classified, multiple sites: Secondary | ICD-10-CM | POA: Diagnosis not present

## 2019-03-18 DIAGNOSIS — M8580 Other specified disorders of bone density and structure, unspecified site: Secondary | ICD-10-CM

## 2019-03-18 DIAGNOSIS — M6281 Muscle weakness (generalized): Secondary | ICD-10-CM | POA: Diagnosis not present

## 2019-03-18 DIAGNOSIS — R262 Difficulty in walking, not elsewhere classified: Secondary | ICD-10-CM | POA: Diagnosis not present

## 2019-03-18 DIAGNOSIS — M25511 Pain in right shoulder: Secondary | ICD-10-CM | POA: Diagnosis not present

## 2019-03-19 DIAGNOSIS — M6259 Muscle wasting and atrophy, not elsewhere classified, multiple sites: Secondary | ICD-10-CM | POA: Diagnosis not present

## 2019-03-19 DIAGNOSIS — M6281 Muscle weakness (generalized): Secondary | ICD-10-CM | POA: Diagnosis not present

## 2019-03-19 DIAGNOSIS — R262 Difficulty in walking, not elsewhere classified: Secondary | ICD-10-CM | POA: Diagnosis not present

## 2019-03-19 DIAGNOSIS — M25511 Pain in right shoulder: Secondary | ICD-10-CM | POA: Diagnosis not present

## 2019-03-19 NOTE — Telephone Encounter (Signed)
Alyson Reedy called on behalf of pt, they do want to start a bone supplement .please call [phone]

## 2019-03-20 MED ORDER — ALENDRONATE SODIUM 70 MG PO TABS: 70.0000 mg | ORAL_TABLET | ORAL | 3 refills | Status: DC

## 2019-03-20 NOTE — Telephone Encounter (Signed)
They would like to start on treatment for osteoporosis- called pt and spoke with herself and [name] Meds ordered this encounter  Medications  . alendronate (FOSAMAX) 70 MG tablet    Sig: Take 1 tablet (70 mg total) by mouth every 7 (seven) days. Take with a full glass of water on an empty stomach.    Dispense:  12 tablet    Refill:  3   Plan to repeat bone density in 2 years

## 2019-03-24 DIAGNOSIS — R262 Difficulty in walking, not elsewhere classified: Secondary | ICD-10-CM | POA: Diagnosis not present

## 2019-03-24 DIAGNOSIS — M25511 Pain in right shoulder: Secondary | ICD-10-CM | POA: Diagnosis not present

## 2019-03-24 DIAGNOSIS — M6281 Muscle weakness (generalized): Secondary | ICD-10-CM | POA: Diagnosis not present

## 2019-03-24 DIAGNOSIS — M6259 Muscle wasting and atrophy, not elsewhere classified, multiple sites: Secondary | ICD-10-CM | POA: Diagnosis not present

## 2019-03-25 DIAGNOSIS — M6281 Muscle weakness (generalized): Secondary | ICD-10-CM | POA: Diagnosis not present

## 2019-03-25 DIAGNOSIS — R262 Difficulty in walking, not elsewhere classified: Secondary | ICD-10-CM | POA: Diagnosis not present

## 2019-03-25 DIAGNOSIS — M6259 Muscle wasting and atrophy, not elsewhere classified, multiple sites: Secondary | ICD-10-CM | POA: Diagnosis not present

## 2019-03-25 DIAGNOSIS — M25511 Pain in right shoulder: Secondary | ICD-10-CM | POA: Diagnosis not present

## 2019-03-26 DIAGNOSIS — M25511 Pain in right shoulder: Secondary | ICD-10-CM | POA: Diagnosis not present

## 2019-03-26 DIAGNOSIS — R262 Difficulty in walking, not elsewhere classified: Secondary | ICD-10-CM | POA: Diagnosis not present

## 2019-03-26 DIAGNOSIS — M6281 Muscle weakness (generalized): Secondary | ICD-10-CM | POA: Diagnosis not present

## 2019-03-26 DIAGNOSIS — M6259 Muscle wasting and atrophy, not elsewhere classified, multiple sites: Secondary | ICD-10-CM | POA: Diagnosis not present

## 2019-03-28 DIAGNOSIS — R413 Other amnesia: Secondary | ICD-10-CM | POA: Diagnosis not present

## 2019-04-01 DIAGNOSIS — M6281 Muscle weakness (generalized): Secondary | ICD-10-CM | POA: Diagnosis not present

## 2019-04-01 DIAGNOSIS — M6259 Muscle wasting and atrophy, not elsewhere classified, multiple sites: Secondary | ICD-10-CM | POA: Diagnosis not present

## 2019-04-01 DIAGNOSIS — R262 Difficulty in walking, not elsewhere classified: Secondary | ICD-10-CM | POA: Diagnosis not present

## 2019-04-01 DIAGNOSIS — M25511 Pain in right shoulder: Secondary | ICD-10-CM | POA: Diagnosis not present

## 2019-04-02 DIAGNOSIS — M6259 Muscle wasting and atrophy, not elsewhere classified, multiple sites: Secondary | ICD-10-CM | POA: Diagnosis not present

## 2019-04-02 DIAGNOSIS — R262 Difficulty in walking, not elsewhere classified: Secondary | ICD-10-CM | POA: Diagnosis not present

## 2019-04-02 DIAGNOSIS — M25511 Pain in right shoulder: Secondary | ICD-10-CM | POA: Diagnosis not present

## 2019-04-02 DIAGNOSIS — M6281 Muscle weakness (generalized): Secondary | ICD-10-CM | POA: Diagnosis not present

## 2019-04-03 DIAGNOSIS — M6259 Muscle wasting and atrophy, not elsewhere classified, multiple sites: Secondary | ICD-10-CM | POA: Diagnosis not present

## 2019-04-03 DIAGNOSIS — R262 Difficulty in walking, not elsewhere classified: Secondary | ICD-10-CM | POA: Diagnosis not present

## 2019-04-03 DIAGNOSIS — M6281 Muscle weakness (generalized): Secondary | ICD-10-CM | POA: Diagnosis not present

## 2019-04-03 DIAGNOSIS — M25511 Pain in right shoulder: Secondary | ICD-10-CM | POA: Diagnosis not present

## 2019-04-07 DIAGNOSIS — R262 Difficulty in walking, not elsewhere classified: Secondary | ICD-10-CM | POA: Diagnosis not present

## 2019-04-07 DIAGNOSIS — M25511 Pain in right shoulder: Secondary | ICD-10-CM | POA: Diagnosis not present

## 2019-04-07 DIAGNOSIS — M6259 Muscle wasting and atrophy, not elsewhere classified, multiple sites: Secondary | ICD-10-CM | POA: Diagnosis not present

## 2019-04-07 DIAGNOSIS — M6281 Muscle weakness (generalized): Secondary | ICD-10-CM | POA: Diagnosis not present

## 2019-04-15 DIAGNOSIS — M25511 Pain in right shoulder: Secondary | ICD-10-CM | POA: Diagnosis not present

## 2019-04-15 DIAGNOSIS — M6259 Muscle wasting and atrophy, not elsewhere classified, multiple sites: Secondary | ICD-10-CM | POA: Diagnosis not present

## 2019-04-15 DIAGNOSIS — M6281 Muscle weakness (generalized): Secondary | ICD-10-CM | POA: Diagnosis not present

## 2019-04-15 DIAGNOSIS — R262 Difficulty in walking, not elsewhere classified: Secondary | ICD-10-CM | POA: Diagnosis not present

## 2019-04-19 NOTE — Progress Notes (Signed)
CARDIOLOGY OFFICE NOTE  Date:  04/22/2019    Carol [name] Date of Birth: 06/24/1928 Medical Record [id number]  PCP:  Darreld Mclean, MD  Cardiologist:  Marisa Cyphers   Chief Complaint  Patient presents with   Follow-up    History of Present Illness: Carol [name] is a 85 y.o. female who presents today for an 8 month check. Seen for Dr. Marlou Porch.   She has a history of PAF, mildly reduced ejection fraction of approximately 50%, prior stroke, breast cancer, and HTN.   Last seen here in December - was off Eliquis, Toprol and Losartan. She was in NSR. There was concern for memory issues. Her heart rate was ok - Toprol was NOT resumed.   The patient does not have symptoms concerning for COVID-19 infection (fever, chills, cough, or new shortness of breath).   Comes in today. Here with her brother today due to memory issues/dementia. She is doing ok. She has no complaint. No chest pain. Not short of breath. No passing out. Brother notes she is a little more anxious. Seen by PCP last month - noted AF. She has someone that comes and gives her medicines to ensure compliance.   Past Medical History:  Diagnosis Date   A-fib ([place])    Anemia    Arrhythmia    Blood transfusion without reported diagnosis    Breast cancer ([place])    Cancer ([place])    Cataract    CHF (congestive heart failure) ([facility])    Dyslipidemia    Hyperlipidemia    Hypertension    Paroxysmal supraventricular tachycardia ([facility])    Stroke ([place])     Past Surgical History:  Procedure Laterality Date   MASTECTOMY     TONSILLECTOMY     WRIST SURGERY       Medications: Current Meds  Medication Sig   alendronate (FOSAMAX) 70 MG tablet Take 1 tablet (70 mg total) by mouth every 7 (seven) days. Take with a full glass of water on an empty stomach.   apixaban (ELIQUIS) 5 MG TABS tablet Take 1 tablet (5 mg total) by mouth 2 (two) times daily.   donepezil (ARICEPT) 10 MG tablet Take 1  tablet (10 mg total) by mouth at bedtime.   losartan (COZAAR) 50 MG tablet Take 1 tablet (50 mg total) by mouth daily.     Allergies: Allergies  Allergen Reactions   Tramadol Other (See Comments)    seizure   Sertraline Other (See Comments)   Zoloft [Sertraline Hcl]     headache    Social History: The patient  reports that she has never smoked. She has never used smokeless tobacco. She reports that she does not drink alcohol or use drugs.   Family History: The patient's family history includes Epilepsy in her mother; Heart disease in her maternal aunt; Stroke in her father.   Review of Systems: Please see the history of present illness.   All other systems are reviewed and negative.   Physical Exam: VS:  BP 112/72    Pulse 67    Ht 5\' 1"  (1.549 m)    Wt 138 lb 12.8 oz (63 kg)    LMP  (LMP Unknown)    SpO2 97%    BMI 26.23 kg/m  .  BMI Body mass index is 26.23 kg/m.  Wt Readings from Last 3 Encounters:  04/22/19 138 lb 12.8 oz (63 kg)  03/10/19 143 lb (64.9 kg)  12/19/18 141 lb (64 kg)  General: Pleasant. Alert and in no acute distress. She looks younger than her stated age.  HEENT: Normal.  Neck: Supple, no JVD, carotid bruits, or masses noted.  Cardiac: Quite irregular and fast today.  HR is about 120 on my exam. No edema.  Respiratory:  Lungs are clear to auscultation bilaterally with normal work of breathing.  GI: Soft and nontender.  MS: No deformity or atrophy. Gait and ROM intact.  Skin: Warm and dry. Color is normal.  Neuro:  Strength and sensation are intact and no gross focal deficits noted.  Psych: Alert, appropriate and with normal affect.   LABORATORY DATA:  EKG:  EKG is ordered today. This shows AF with VR of 107.   Lab Results  Component Value Date   WBC 6.1 08/19/2018   HGB 13.8 08/19/2018   HCT 41.8 08/19/2018   PLT 252.0 08/19/2018   GLUCOSE 91 08/19/2018   CHOL 203 (H) 08/19/2018   TRIG 100.0 08/19/2018   HDL 69.10 08/19/2018    LDLCALC 114 (H) 08/19/2018   ALT 12 08/19/2018   AST 19 08/19/2018   NA 138 08/19/2018   K 4.9 08/19/2018   CL 102 08/19/2018   CREATININE 0.78 08/19/2018   BUN 13 08/19/2018   CO2 28 08/19/2018   TSH 1.940 09/11/2018   INR 1.11 10/03/2016   HGBA1C 5.6 08/19/2018     BNP (last 3 results) No results for input(s): BNP in the last 8760 hours.  ProBNP (last 3 results) No results for input(s): PROBNP in the last 8760 hours.   Other Studies Reviewed Today:   Assessment/Plan:  1. Persistent AF - rate is not controlled - adding back Toprol today at 50 mg a day - she is totally asymptomatic.   Lab today.   2. Chronic anticoagulation - no problems noted - needs lab today.   3. Cardiomyopathy with mild LV systolic dysfunction - no symptoms noted. She needs better rate control to prevent worsening LV dysfunction and associated symptoms.   4. HTN - BP is fine today.   5. Dementia/Memory issues   6. COVID-19 Education: The signs and symptoms of COVID-19 were discussed with the patient and how to seek care for testing (follow up with PCP or arrange E-visit).  The importance of social distancing, staying at home, hand hygiene and wearing a mask when out in public were discussed today.  Current medicines are reviewed with the patient today.  The patient does not have concerns regarding medicines other than what has been noted above.  The following changes have been made:  See above.  Labs/ tests ordered today include:    Orders Placed This Encounter  Procedures   Basic metabolic panel   CBC   EKG 12-Lead     Disposition:   FU with [place] in about 4 to 6 weeks with EKG.  Patient is agreeable to this plan and will call if any problems develop in the interim.   SignedTruitt Merle, NP  04/22/2019 1:55 PM  [facility] [address] [place],   [address] Phone: [phone] Fax: [phone]

## 2019-04-22 ENCOUNTER — Ambulatory Visit (INDEPENDENT_AMBULATORY_CARE_PROVIDER_SITE_OTHER): Payer: Medicare HMO | Admitting: Nurse Practitioner

## 2019-04-22 ENCOUNTER — Other Ambulatory Visit: Payer: Self-pay

## 2019-04-22 ENCOUNTER — Encounter: Payer: Self-pay | Admitting: Nurse Practitioner

## 2019-04-22 VITALS — BP 112/72 | HR 67 | Ht 61.0 in | Wt 138.8 lb

## 2019-04-22 DIAGNOSIS — I4821 Permanent atrial fibrillation: Secondary | ICD-10-CM | POA: Diagnosis not present

## 2019-04-22 DIAGNOSIS — Z7901 Long term (current) use of anticoagulants: Secondary | ICD-10-CM | POA: Diagnosis not present

## 2019-04-22 DIAGNOSIS — I10 Essential (primary) hypertension: Secondary | ICD-10-CM | POA: Diagnosis not present

## 2019-04-22 DIAGNOSIS — E785 Hyperlipidemia, unspecified: Secondary | ICD-10-CM

## 2019-04-22 DIAGNOSIS — Z7189 Other specified counseling: Secondary | ICD-10-CM

## 2019-04-22 MED ORDER — METOPROLOL SUCCINATE ER 50 MG PO TB24: 50.0000 mg | ORAL_TABLET | Freq: Every day | ORAL | 3 refills | Status: DC

## 2019-04-22 NOTE — Patient Instructions (Addendum)
After Visit Summary:  We will be checking the following labs today - BMET & CBC   Medication Instructions:    Continue with your current medicines. BUT  I am adding back Toprol 50 mg to take once a day   If you need a refill on your cardiac medications before your next appointment, please call your pharmacy.     Testing/Procedures To Be Arranged:  N/A  Follow-Up:   See [place] in about 4 to 6 weeks with an EKG    At [facility], you and your health needs are our priority.  As part of our continuing mission to provide you with exceptional heart care, we have created designated Provider Care Teams.  These Care Teams include your primary Cardiologist (physician) and Advanced Practice Providers (APPs -  Physician Assistants and Nurse Practitioners) who all work together to provide you with the care you need, when you need it.  Special Instructions:  . Stay safe, stay home, wash your hands for at least 20 seconds and wear a mask when out in public.  . It was good to talk with you both today.    Call the [place] office at [phone] if you have any questions, problems or concerns.

## 2019-04-23 LAB — BASIC METABOLIC PANEL
BUN/Creatinine Ratio: 19 (ref 12–28)
BUN: 18 mg/dL (ref 8–27)
CO2: 23 mmol/L (ref 20–29)
Calcium: 10.4 mg/dL — ABNORMAL HIGH (ref 8.7–10.3)
Chloride: 99 mmol/L (ref 96–106)
Creatinine, Ser: 0.93 mg/dL (ref 0.57–1.00)
GFR calc Af Amer: 63 mL/min/{1.73_m2} (ref >59)
GFR calc non Af Amer: 55 mL/min/{1.73_m2} — ABNORMAL LOW (ref >59)
Glucose: 110 mg/dL — ABNORMAL HIGH (ref 65–99)
Potassium: 4.6 mmol/L (ref 3.5–5.2)
Sodium: 140 mmol/L (ref 134–144)

## 2019-04-23 LAB — CBC
Hematocrit: 44.5 % (ref 34.0–46.6)
Hemoglobin: 14.4 g/dL (ref 11.1–15.9)
MCH: 29.7 pg (ref 26.6–33.0)
MCHC: 32.4 g/dL (ref 31.5–35.7)
MCV: 92 fL (ref 79–97)
Platelets: 272 10*3/uL (ref 150–450)
RBC: 4.85 x10E6/uL (ref 3.77–5.28)
RDW: 14 % (ref 11.7–15.4)
WBC: 8.7 10*3/uL (ref 3.4–10.8)

## 2019-05-23 NOTE — Progress Notes (Signed)
CARDIOLOGY OFFICE NOTE  Date:  05/27/2019    Carol [name] Date of Birth: 05-03-1928 Medical Record [id number]  PCP:  Darreld Mclean, MD  Cardiologist:  Marisa Cyphers  Chief Complaint  Patient presents with  . Follow-up    History of Present Illness: Carol [name] is a 85 y.o. female who presents today for a one month check. Seen for Dr. Marlou Porch.   She has a history of PAF, mildly reduced ejection fraction of approximately 50%, prior stroke, breast cancer, and HTN.   Last seen here in December by Dr. Marlou Porch - was off Eliquis, Toprol and Losartan. She was in NSR. There was concern for memory issues. Her heart rate was ok - Toprol was NOT resumed at that time.   I then saw her last month - she has had progressive dementia/memory issues. She has someone that comes and physically gives her her medicines to ensure compliance. She was back in AF with inadequate rate control and I restarted the Toprol.   The patient does not have symptoms concerning for COVID-19 infection (fever, chills, cough, or new shortness of breath).   Comes in today. Here with her brother. She is doing well. Feels fine. No chest pain. Breathing is good. No awareness of her AF. Tolerating her medicines. They are both happy with how she is doing.  Past Medical History:  Diagnosis Date  . A-fib ([place])   . Anemia   . Arrhythmia   . Blood transfusion without reported diagnosis   . Breast cancer ([place])   . Cancer ([place])   . Cataract   . CHF (congestive heart failure) ([place])   . Dyslipidemia   . Hyperlipidemia   . Hypertension   . Paroxysmal supraventricular tachycardia ([place])   . Stroke [facility])     Past Surgical History:  Procedure Laterality Date  . MASTECTOMY    . TONSILLECTOMY    . WRIST SURGERY       Medications: Current Meds  Medication Sig  . alendronate (FOSAMAX) 70 MG tablet Take 1 tablet (70 mg total) by mouth every 7 (seven) days. Take with a full glass of water on an empty  stomach.  Marland Kitchen apixaban (ELIQUIS) 5 MG TABS tablet Take 1 tablet (5 mg total) by mouth 2 (two) times daily.  Marland Kitchen donepezil (ARICEPT) 10 MG tablet Take 1 tablet (10 mg total) by mouth at bedtime.  Marland Kitchen losartan (COZAAR) 50 MG tablet Take 1 tablet (50 mg total) by mouth daily.  . metoprolol succinate (TOPROL-XL) 50 MG 24 hr tablet Take 1 tablet (50 mg total) by mouth daily. Take with or immediately following a meal.  . [DISCONTINUED] metoprolol succinate (TOPROL-XL) 50 MG 24 hr tablet Take 1 tablet (50 mg total) by mouth daily. Take with or immediately following a meal.     Allergies: Allergies  Allergen Reactions  . Tramadol Other (See Comments)    seizure  . Sertraline Other (See Comments)  . Zoloft [Sertraline Hcl]     headache    Social History: The patient  reports that she has never smoked. She has never used smokeless tobacco. She reports that she does not drink alcohol or use drugs.   Family History: The patient's family history includes Epilepsy in her mother; Heart disease in her maternal aunt; Stroke in her father.   Review of Systems: Please see the history of present illness.   All other systems are reviewed and negative.   Physical Exam: VS:  BP 130/80 (BP  Location: Left Arm, Patient Position: Sitting, Cuff Size: Normal)   Pulse 72   Ht 5\' 2"  (1.575 m)   Wt 140 lb (63.5 kg)   LMP  (LMP Unknown)   SpO2 98%   BMI 25.61 kg/m  .  BMI Body mass index is 25.61 kg/m.  Wt Readings from Last 3 Encounters:  05/27/19 140 lb (63.5 kg)  04/22/19 138 lb 12.8 oz (63 kg)  03/10/19 143 lb (64.9 kg)    General: Pleasant. Alert and in no acute distress. She looks younger than her stated age.   HEENT: Normal.  Neck: Supple, no JVD, carotid bruits, or masses noted.  Cardiac: Irregular irregular rhythm. Her rate is fine.  No murmurs, rubs, or gallops. No edema.  Respiratory:  Lungs are clear to auscultation bilaterally with normal work of breathing.  GI: Soft and nontender.  MS: No  deformity or atrophy. Gait and ROM intact.  Skin: Warm and dry. Color is normal.  Neuro:  Strength and sensation are intact and no gross focal deficits noted.  Psych: Alert, appropriate and with normal affect.   LABORATORY DATA:  EKG:  EKG is ordered today. This demonstrates AF with controlled VR of 72.  Lab Results  Component Value Date   WBC 8.7 04/22/2019   HGB 14.4 04/22/2019   HCT 44.5 04/22/2019   PLT 272 04/22/2019   GLUCOSE 110 (H) 04/22/2019   CHOL 203 (H) 08/19/2018   TRIG 100.0 08/19/2018   HDL 69.10 08/19/2018   LDLCALC 114 (H) 08/19/2018   ALT 12 08/19/2018   AST 19 08/19/2018   NA 140 04/22/2019   K 4.6 04/22/2019   CL 99 04/22/2019   CREATININE 0.93 04/22/2019   BUN 18 04/22/2019   CO2 23 04/22/2019   TSH 1.940 09/11/2018   INR 1.11 10/03/2016   HGBA1C 5.6 08/19/2018     BNP (last 3 results) No results for input(s): BNP in the last 8760 hours.  ProBNP (last 3 results) No results for input(s): PROBNP in the last 8760 hours.   Other Studies Reviewed Today:   Assessment & Plan:  1. Persistent AF - had added back Toprol at last visit for better rate controlled - this has been achieved. Doing well. No changes made today.   2. Chronic anticoagulation - no problems noted.   3. Cardiomyopathy with mild LV systolic dysfunction from outside echo noted - she has no symptoms. Would favor conservative management along with rate control which we have achieved. She is doing well clinically.    4. HTN - BP is fine - no changes made today.   5. Dementia/memory issues  6. COVID-19 Education: The signs and symptoms of COVID-19 were discussed with the patient and how to seek care for testing (follow up with PCP or arrange E-visit).  The importance of social distancing, staying at home, hand hygiene and wearing a mask when out in public were discussed today.  Current medicines are reviewed with the patient today.  The patient does not have concerns regarding  medicines other than what has been noted above.  The following changes have been made:  See above.  Labs/ tests ordered today include:    Orders Placed This Encounter  Procedures  . EKG 12-Lead     Disposition:   FU with me in 6 months.    Patient is agreeable to this plan and will call if any problems develop in the interim.   SignedTruitt Merle, NP  05/27/2019 2:23 PM  [facility] [address] [place], [place]  [address] Phone: [phone] Fax: [phone]

## 2019-05-27 ENCOUNTER — Encounter: Payer: Self-pay | Admitting: Nurse Practitioner

## 2019-05-27 ENCOUNTER — Ambulatory Visit: Payer: Medicare HMO | Admitting: Nurse Practitioner

## 2019-05-27 ENCOUNTER — Encounter (INDEPENDENT_AMBULATORY_CARE_PROVIDER_SITE_OTHER): Payer: Self-pay

## 2019-05-27 ENCOUNTER — Other Ambulatory Visit: Payer: Self-pay

## 2019-05-27 VITALS — BP 130/80 | HR 72 | Ht 62.0 in | Wt 140.0 lb

## 2019-05-27 DIAGNOSIS — I4821 Permanent atrial fibrillation: Secondary | ICD-10-CM | POA: Diagnosis not present

## 2019-05-27 DIAGNOSIS — I10 Essential (primary) hypertension: Secondary | ICD-10-CM

## 2019-05-27 DIAGNOSIS — Z7189 Other specified counseling: Secondary | ICD-10-CM | POA: Diagnosis not present

## 2019-05-27 DIAGNOSIS — Z7901 Long term (current) use of anticoagulants: Secondary | ICD-10-CM | POA: Diagnosis not present

## 2019-05-27 MED ORDER — METOPROLOL SUCCINATE ER 50 MG PO TB24: 50.0000 mg | ORAL_TABLET | Freq: Every day | ORAL | 3 refills | Status: DC

## 2019-05-27 NOTE — Patient Instructions (Addendum)
After Visit Summary:  We will be checking the following labs today - NONE   Medication Instructions:    Continue with your current medicines.   I sent in your refill for the Toprol to [facility]   If you need a refill on your cardiac medications before your next appointment, please call your pharmacy.     Testing/Procedures To Be Arranged:  N/A  Follow-Up:   See me in 6 months    At [facility], you and your health needs are our priority.  As part of our continuing mission to provide you with exceptional heart care, we have created designated Provider Care Teams.  These Care Teams include your primary Cardiologist (physician) and Advanced Practice Providers (APPs -  Physician Assistants and Nurse Practitioners) who all work together to provide you with the care you need, when you need it.  Special Instructions:  . Stay safe, stay home, wash your hands for at least 20 seconds and wear a mask when out in public.  . It was good to talk with you today.    Call the [place] office at [phone] if you have any questions, problems or concerns.

## 2019-07-02 NOTE — Progress Notes (Signed)
[facility], [place], [place] [address] [phone]  Date:  07/03/2019   Name:  Carol [name]   DOB:  01-20-1928   MRN:  [id number]  PCP:  Darreld Mclean, MD    Chief Complaint: Epistaxis (3-4 in last 3 weeks, off and on )   History of Present Illness:  Carol [name] is a 85 y.o. very pleasant female patient who presents with the following:  Pt with history of a fib on eliquis, hyperlipidemia, HTN, mild dementia Here today with concern of nose bleeds  Seen by cardiology about 5 weeks ago- doing ok at that time, noted to have worsening dementia  Flu shot:done for the year   Seen today with her SIL [name] who contributes to the history  She notes that she has tended towards nosebleeds her whole life since she was a child Over the last several months this has been more problematic She had 2-3x the last few weeks She may bleed for about 10 minutes-she is able to stop the bleeding by putting a rolled up paper towel under her nose She is not sure of any inciting factors for her nosebleeds No bruising, no blood in her stool or urine   She has never seen ENT for her nosebleeds   She tends to bleed repeatedly from the right nare  Patient Active Problem List   Diagnosis Date Noted  . Paroxysmal atrial fibrillation ([place]) 08/13/2018  . Right knee pain 11/16/2017  . Osteopenia 11/12/2016  . Mild cognitive disorder 10/10/2016  . Chronic anticoagulation 10/04/2016  . History of atrial fibrillation 10/04/2016  . Essential hypertension, benign 09/01/2016  . Paroxysmal supraventricular tachycardia ([place]) 09/01/2016  . Hyperlipidemia 09/01/2016    Past Medical History:  Diagnosis Date  . A-fib ([place])   . Anemia   . Arrhythmia   . Blood transfusion without reported diagnosis   . Breast cancer ([place])   . Cancer ([place])   . Cataract   . CHF (congestive heart failure) ([place])   . Dyslipidemia   .  Hyperlipidemia   . Hypertension   . Paroxysmal supraventricular tachycardia ([place])   . Stroke [facility])     Past Surgical History:  Procedure Laterality Date  . MASTECTOMY    . TONSILLECTOMY    . WRIST SURGERY      Social History   Tobacco Use  . Smoking status: Never Smoker  . Smokeless tobacco: Never Used  Substance Use Topics  . Alcohol use: No  . Drug use: No    Family History  Problem Relation Age of Onset  . Epilepsy Mother   . Stroke Father   . Heart disease Maternal Aunt     Allergies  Allergen Reactions  . Tramadol Other (See Comments)    seizure  . Sertraline Other (See Comments)  . Zoloft [Sertraline Hcl]     headache    Medication list has been reviewed and updated.  Current Outpatient Medications on File Prior to Visit  Medication Sig Dispense Refill  . alendronate (FOSAMAX) 70 MG tablet Take 1 tablet (70 mg total) by mouth every 7 (seven) days. Take with a full glass of water on an empty stomach. 12 tablet 3  . apixaban (ELIQUIS) 5 MG TABS tablet Take 1 tablet (5 mg total) by mouth 2 (two) times daily. 180 tablet 3  . donepezil (ARICEPT) 10 MG tablet Take 1 tablet (10 mg total) by mouth at bedtime.  90 tablet 4  . losartan (COZAAR) 50 MG tablet Take 1 tablet (50 mg total) by mouth daily. 90 tablet 3  . metoprolol succinate (TOPROL-XL) 50 MG 24 hr tablet Take 1 tablet (50 mg total) by mouth daily. Take with or immediately following a meal. 90 tablet 3   No current facility-administered medications on file prior to visit.     Review of Systems:  As per HPI- otherwise negative.   Physical Examination: Vitals:   07/03/19 1032  BP: 110/80  Pulse: 97  Resp: 16  Temp: (!) 97.1 F (36.2 C)  SpO2: 98%   Vitals:   07/03/19 1032  Weight: 138 lb (62.6 kg)  Height: 5\' 2"  (1.575 m)   Body mass index is 25.24 kg/m. Ideal Body Weight: Weight in (lb) to have BMI = 25: 136.4  GEN: WDWN, NAD, Non-toxic, A & O x 3, elderly woman in no distress.  Looks  well HEENT: Atraumatic, Normocephalic. Neck supple. No masses, No LAD.  Bilateral TM wnl, oropharynx normal.  PEERL,EOMI. no visible bleeding site inside either nostril Ears and Nose: No external deformity. CV: Rate controlled atrial fib, No M/G/R. No JVD. No thrill. No extra heart sounds. PULM: CTA B, no wheezes, crackles, rhonchi. No retractions. No resp. distress. No accessory muscle use. ABD: S, NT, ND, +BS. No rebound. No HSM. EXTR: No c/c/e NEURO Normal gait.  PSYCH: Normally interactive. Conversant. Not depressed or anxious appearing.  Calm demeanor.    Assessment and Plan: Recurrent epistaxis - Plan: CBC, Ambulatory referral to ENT  Here today with recurrent epistaxis.  Suspect she has a superficial blood vessel which is bleeding repeatedly.  This may be exacerbated by use of Eliquis, but would prefer not to stop this medication due to atrial for.  The patient agrees that she would rather deal with nosebleeds then risk a stroke.  We will refer her to ENT for evaluation, perhaps there is a vessel which can be cauterized in the office. We will also check CBC Discussed some strategies to reduce nosebleeds including moisturizing the nasal cavity, and use of Afrin in case of acute bleeding She will let me know if any worsening or change in her symptoms  Signed Lamar Blinks, MD  Received her blood counts as below Hemoglobin stable  Results for orders placed or performed in visit on 07/03/19  CBC  Result Value Ref Range   WBC 7.0 4.0 - 10.5 K/uL   RBC 4.49 3.87 - 5.11 Mil/uL   Platelets 239.0 150.0 - 400.0 K/uL   Hemoglobin 13.6 12.0 - 15.0 g/dL   HCT 41.2 36.0 - 46.0 %   MCV 91.8 78.0 - 100.0 fl   MCHC 33.1 30.0 - 36.0 g/dL   RDW 14.4 11.5 - 15.5 %   Letter to patient

## 2019-07-03 ENCOUNTER — Ambulatory Visit (INDEPENDENT_AMBULATORY_CARE_PROVIDER_SITE_OTHER): Payer: Medicare HMO | Admitting: Family Medicine

## 2019-07-03 ENCOUNTER — Encounter: Payer: Self-pay | Admitting: Family Medicine

## 2019-07-03 ENCOUNTER — Other Ambulatory Visit: Payer: Self-pay

## 2019-07-03 VITALS — BP 110/80 | HR 97 | Temp 97.1°F | Resp 16 | Ht 62.0 in | Wt 138.0 lb

## 2019-07-03 DIAGNOSIS — R040 Epistaxis: Secondary | ICD-10-CM

## 2019-07-03 LAB — CBC
HCT: 41.2 % (ref 36.0–46.0)
Hemoglobin: 13.6 g/dL (ref 12.0–15.0)
MCHC: 33.1 g/dL (ref 30.0–36.0)
MCV: 91.8 fl (ref 78.0–100.0)
Platelets: 239 10*3/uL (ref 150.0–400.0)
RBC: 4.49 Mil/uL (ref 3.87–5.11)
RDW: 14.4 % (ref 11.5–15.5)
WBC: 7 10*3/uL (ref 4.0–10.5)

## 2019-07-03 NOTE — Patient Instructions (Signed)
It was good to see you today!  I will be in touch with your blood counts and also will set you up to see an ENT doctor about your nosebleeds You might try vaseline or aquaphor in your nostrils a few times a week to prevent dryness.  Afrin nasal spray can also be helpful in case of acute bleed.    If you are having more severe bleeds that cannot be brought under control we will consider other options.  Please keep me posted!

## 2019-07-08 DIAGNOSIS — Z7289 Other problems related to lifestyle: Secondary | ICD-10-CM | POA: Diagnosis not present

## 2019-07-08 DIAGNOSIS — J310 Chronic rhinitis: Secondary | ICD-10-CM | POA: Diagnosis not present

## 2019-07-08 DIAGNOSIS — R040 Epistaxis: Secondary | ICD-10-CM | POA: Diagnosis not present

## 2019-07-08 DIAGNOSIS — Z7901 Long term (current) use of anticoagulants: Secondary | ICD-10-CM | POA: Diagnosis not present

## 2019-07-09 DIAGNOSIS — L821 Other seborrheic keratosis: Secondary | ICD-10-CM | POA: Diagnosis not present

## 2019-08-11 DIAGNOSIS — R040 Epistaxis: Secondary | ICD-10-CM | POA: Diagnosis not present

## 2019-08-11 DIAGNOSIS — Z7901 Long term (current) use of anticoagulants: Secondary | ICD-10-CM | POA: Diagnosis not present

## 2019-09-03 ENCOUNTER — Telehealth: Payer: Self-pay | Admitting: *Deleted

## 2019-09-03 NOTE — Telephone Encounter (Signed)
Copied from [place] [phone]. Topic: General - Other >> Sep 02, 2019  4:10 PM Rainey Pines A wrote:  Patients sister Alyson Reedy  would like a callback from nurse in regards to medication donepezil (ARICEPT) 10 MG tablet . She wants to know what side effects are associated with patient not taking medication anymore. Please contact  [phone].

## 2019-09-03 NOTE — Telephone Encounter (Signed)
Called [name] back and spoke with her.  [name] has misplaced her Aricept, they actually paid cash for 2 weeks worth but then she lost this as well.  It will be about another week before insurance will pay for a refill.  They are really not sure if it is helping her at this point.  Advised if they want to try stopping this medication for a month or so and see if it makes a difference that is okay.  In any case, I do not think will harm her to go without it for a week during this insurance

## 2019-09-05 ENCOUNTER — Emergency Department (HOSPITAL_BASED_OUTPATIENT_CLINIC_OR_DEPARTMENT_OTHER)
Admission: EM | Admit: 2019-09-05 | Discharge: 2019-09-05 | Disposition: A | Discharge status: Home / Self Care | Payer: Medicare HMO | Attending: Emergency Medicine | Admitting: Emergency Medicine

## 2019-09-05 ENCOUNTER — Other Ambulatory Visit: Payer: Self-pay

## 2019-09-05 ENCOUNTER — Telehealth: Payer: Self-pay | Admitting: Cardiology

## 2019-09-05 ENCOUNTER — Encounter (HOSPITAL_BASED_OUTPATIENT_CLINIC_OR_DEPARTMENT_OTHER): Payer: Self-pay | Admitting: Emergency Medicine

## 2019-09-05 DIAGNOSIS — I110 Hypertensive heart disease with heart failure: Secondary | ICD-10-CM | POA: Diagnosis not present

## 2019-09-05 DIAGNOSIS — R040 Epistaxis: Secondary | ICD-10-CM | POA: Insufficient documentation

## 2019-09-05 DIAGNOSIS — I509 Heart failure, unspecified: Secondary | ICD-10-CM | POA: Diagnosis not present

## 2019-09-05 DIAGNOSIS — Z79899 Other long term (current) drug therapy: Secondary | ICD-10-CM | POA: Diagnosis not present

## 2019-09-05 DIAGNOSIS — Z8673 Personal history of transient ischemic attack (TIA), and cerebral infarction without residual deficits: Secondary | ICD-10-CM | POA: Diagnosis not present

## 2019-09-05 DIAGNOSIS — Z7901 Long term (current) use of anticoagulants: Secondary | ICD-10-CM | POA: Diagnosis not present

## 2019-09-05 DIAGNOSIS — R55 Syncope and collapse: Secondary | ICD-10-CM | POA: Insufficient documentation

## 2019-09-05 DIAGNOSIS — Z853 Personal history of malignant neoplasm of breast: Secondary | ICD-10-CM | POA: Insufficient documentation

## 2019-09-05 DIAGNOSIS — R5381 Other malaise: Secondary | ICD-10-CM | POA: Diagnosis not present

## 2019-09-05 LAB — CBC WITH DIFFERENTIAL/PLATELET
Abs Immature Granulocytes: 0.02 10*3/uL (ref 0.00–0.07)
Basophils Absolute: 0.1 10*3/uL (ref 0.0–0.1)
Basophils Relative: 1 %
Eosinophils Absolute: 0.2 10*3/uL (ref 0.0–0.5)
Eosinophils Relative: 2 %
HCT: 35.4 % — ABNORMAL LOW (ref 36.0–46.0)
Hemoglobin: 11.5 g/dL — ABNORMAL LOW (ref 12.0–15.0)
Immature Granulocytes: 0 %
Lymphocytes Relative: 40 %
Lymphs Abs: 3.3 10*3/uL (ref 0.7–4.0)
MCH: 30.3 pg (ref 26.0–34.0)
MCHC: 32.5 g/dL (ref 30.0–36.0)
MCV: 93.4 fL (ref 80.0–100.0)
Monocytes Absolute: 1 10*3/uL (ref 0.1–1.0)
Monocytes Relative: 12 %
Neutro Abs: 3.7 10*3/uL (ref 1.7–7.7)
Neutrophils Relative %: 45 %
Platelets: 235 10*3/uL (ref 150–400)
RBC: 3.79 MIL/uL — ABNORMAL LOW (ref 3.87–5.11)
RDW: 13.6 % (ref 11.5–15.5)
WBC: 8.3 10*3/uL (ref 4.0–10.5)
nRBC: 0 % (ref 0.0–0.2)

## 2019-09-05 LAB — BASIC METABOLIC PANEL
Anion gap: 9 (ref 5–15)
BUN: 23 mg/dL (ref 8–23)
CO2: 27 mmol/L (ref 22–32)
Calcium: 9 mg/dL (ref 8.9–10.3)
Chloride: 101 mmol/L (ref 98–111)
Creatinine, Ser: 0.61 mg/dL (ref 0.44–1.00)
GFR calc Af Amer: >60 mL/min (ref >60)
GFR calc non Af Amer: >60 mL/min (ref >60)
Glucose, Bld: 107 mg/dL — ABNORMAL HIGH (ref 70–99)
Potassium: 4.1 mmol/L (ref 3.5–5.1)
Sodium: 137 mmol/L (ref 135–145)

## 2019-09-05 MED ORDER — SILVER NITRATE-POT NITRATE 75-25 % EX MISC
CUTANEOUS | Status: AC
  Filled 2019-09-05: qty 10

## 2019-09-05 MED ORDER — SODIUM CHLORIDE 0.9 % IV BOLUS
500.0000 mL | Freq: Once | INTRAVENOUS | Status: AC
  Administered 2019-09-05: 500 mL via INTRAVENOUS

## 2019-09-05 NOTE — Telephone Encounter (Signed)
Patients daughter states that her mother went the hospital on 68 last night due to a nose bleed. They were finally able to get the nose bleed to stop but then later in the night it started again. She was then diagnosed with acute interior epistaxis near syncope. [name], the daughter, would like to know where to go from here.

## 2019-09-05 NOTE — ED Notes (Signed)
Nose clamp removed; cleaned around nose with wet gauze; no active bleeding noted; will continue to monitor. Patient resting comfortably.

## 2019-09-05 NOTE — Telephone Encounter (Signed)
[place] nasal spray or saline spray.  Follow-up with Dr. Laurance Flatten her ENT.  She deserves to be on anticoagulation Eliquis 5 twice a day especially given her high CHA2DS2-VASc score and prior strokes.  She is not less than 60 kg. Candee Furbish, MD

## 2019-09-05 NOTE — ED Notes (Signed)
Patient up out of bed at doorway; states she is cold; assisted patient back onto stretcher and gave fresh warm blankets to patient. No bleeding from nose noted.

## 2019-09-05 NOTE — ED Triage Notes (Signed)
Patient presents via EMS from facility with complaints of nosebleed; EMS state this is the 2nd call out tonight for her with this complaint; states they were able to get the bleeding to stop earlier then the bleeding started again.

## 2019-09-05 NOTE — ED Notes (Signed)
Patient ambulatory to bathroom with 1 assist; patient tolerated well; while patient stood up to wipe herself and pull her pants up she stated she felt faint and like she was going to pass out; patient assisted back onto the toilet; patient awake and responsive; pale; patient assisted onto the stretcher from the toilet and Dr Florina Ou at bedside. Patient placed on monitor and IV access obtained. Patient more coherent and alert once returning onto the stretcher.

## 2019-09-05 NOTE — ED Notes (Signed)
Nose clamp applied per Dr Florina Ou; cleaned patient's hands and face; warm blankets applied; patient resting comfortably

## 2019-09-05 NOTE — ED Notes (Signed)
Brother called to pick up pt

## 2019-09-05 NOTE — ED Provider Notes (Signed)
[facility] Provider Note: Georgena Spurling, MD, FACEP  CSN: [id number] MRN: [id number] ARRIVAL: 09/05/19 at [place]: [place] PRESENT ILLNESS  09/05/19 2:33 AM Carol [name] is a 86 y.o. female who is on Eliquis for atrial fibrillation.  She is here with nosebleed that began about 6 PM yesterday evening.  EMS was able to stop at the first time but it recurred and has persisted for over an hour.  She has tried pressure without relief.  She denies injury to her nose and denies associated pain.  Bleeding has been moderate.  She has no history of nosebleeds.   Past Medical History:  Diagnosis Date  . A-fib ([place])   . Anemia   . Arrhythmia   . Blood transfusion without reported diagnosis   . Breast cancer ([place])   . Cancer ([place])   . Cataract   . CHF (congestive heart failure) ([place])   . Dyslipidemia   . Hyperlipidemia   . Hypertension   . Paroxysmal supraventricular tachycardia ([place])   . Stroke [facility])     Past Surgical History:  Procedure Laterality Date  . MASTECTOMY    . TONSILLECTOMY    . WRIST SURGERY      Family History  Problem Relation Age of Onset  . Epilepsy Mother   . Stroke Father   . Heart disease Maternal Aunt     Social History   Tobacco Use  . Smoking status: Never Smoker  . Smokeless tobacco: Never Used  Substance Use Topics  . Alcohol use: No  . Drug use: No    Prior to Admission medications   Medication Sig Start Date End Date Taking? Authorizing Provider  alendronate (FOSAMAX) 70 MG tablet Take 1 tablet (70 mg total) by mouth every 7 (seven) days. Take with a full glass of water on an empty stomach. 03/20/19   Copland, Gay Filler, MD  apixaban (ELIQUIS) 5 MG TABS tablet Take 1 tablet (5 mg total) by mouth 2 (two) times daily. 08/13/18 08/08/19  Jerline Pain, MD  donepezil (ARICEPT) 10 MG tablet Take 1 tablet (10 mg total) by mouth at bedtime. 09/11/18   Marcial Pacas, MD  losartan (COZAAR) 50  MG tablet Take 1 tablet (50 mg total) by mouth daily. 08/13/18 08/08/19  Jerline Pain, MD  metoprolol succinate (TOPROL-XL) 50 MG 24 hr tablet Take 1 tablet (50 mg total) by mouth daily. Take with or immediately following a meal. 05/27/19 08/25/19  Burtis Junes, NP    Allergies Tramadol, Sertraline, and Zoloft [sertraline hcl]   REVIEW OF SYSTEMS  Negative except as noted here or in the History of Present Illness.   PHYSICAL EXAMINATION  Initial Vital Signs Blood pressure (!) 153/65, pulse 74, temperature 98.2 F (36.8 C), temperature source Oral, resp. rate 16, height 5\' 7"  (1.702 m), weight 62 kg, SpO2 95 %.  Examination General: Well-developed, well-nourished female in no acute distress; appearance consistent with age of record HENT: normocephalic; atraumatic; right anterior septal epistaxis Eyes: pupils equal, round and reactive to light; extraocular muscles intact; bilateral pseudophakia Neck: supple Heart: Irregular rhythm  Lungs: clear to auscultation bilaterally Abdomen: soft; nondistended; nontender; bowel sounds present Extremities: No deformity; full range of motion; pulses normal; trace edema of lower legs Neurologic: Awake, alert and oriented; motor function intact in all extremities and symmetric; no facial droop Skin: Warm and dry Psychiatric: Normal mood and affect   RESULTS  Summary of  this visit's results, reviewed and interpreted by myself:   EKG Interpretation  Date/Time:    Ventricular Rate:    PR Interval:    QRS Duration:   QT Interval:    QTC Calculation:   R Axis:     Text Interpretation:        Laboratory Studies: Results for orders placed or performed during the hospital encounter of 09/05/19 (from the past 24 hour(s))  CBC with Differential     Status: Abnormal   Collection Time: 09/05/19  6:23 AM  Result Value Ref Range   WBC 8.3 4.0 - 10.5 K/uL   RBC 3.79 (L) 3.87 - 5.11 MIL/uL   Hemoglobin 11.5 (L) 12.0 - 15.0 g/dL   HCT 35.4  (L) 36.0 - 46.0 %   MCV 93.4 80.0 - 100.0 fL   MCH 30.3 26.0 - 34.0 pg   MCHC 32.5 30.0 - 36.0 g/dL   RDW 13.6 11.5 - 15.5 %   Platelets 235 150 - 400 K/uL   nRBC 0.0 0.0 - 0.2 %   Neutrophils Relative % 45 %   Neutro Abs 3.7 1.7 - 7.7 K/uL   Lymphocytes Relative 40 %   Lymphs Abs 3.3 0.7 - 4.0 K/uL   Monocytes Relative 12 %   Monocytes Absolute 1.0 0.1 - 1.0 K/uL   Eosinophils Relative 2 %   Eosinophils Absolute 0.2 0.0 - 0.5 K/uL   Basophils Relative 1 %   Basophils Absolute 0.1 0.0 - 0.1 K/uL   Immature Granulocytes 0 %   Abs Immature Granulocytes 0.02 0.00 - 0.07 K/uL  Basic metabolic panel     Status: Abnormal   Collection Time: 09/05/19  6:23 AM  Result Value Ref Range   Sodium 137 135 - 145 mmol/L   Potassium 4.1 3.5 - 5.1 mmol/L   Chloride 101 98 - 111 mmol/L   CO2 27 22 - 32 mmol/L   Glucose, Bld 107 (H) 70 - 99 mg/dL   BUN 23 8 - 23 mg/dL   Creatinine, Ser 0.61 0.44 - 1.00 mg/dL   Calcium 9.0 8.9 - 10.3 mg/dL   GFR calc non Af Amer >60 >60 mL/min   GFR calc Af Amer >60 >60 mL/min   Anion gap 9 5 - 15   Imaging Studies: No results found.  ED COURSE and MDM  Nursing notes, initial and subsequent vitals signs, including pulse oximetry, reviewed and interpreted by myself.  Vitals:   09/05/19 0216 09/05/19 0223 09/05/19 0617  BP: (!) 153/65  134/84  Pulse: 74  78  Resp: 16  16  Temp: 98.2 F (36.8 C)    TempSrc: Oral    SpO2: 95%  96%  Weight:  62 kg   Height:  5\' 7"  (1.702 m)    Medications  silver nitrate applicators [id number] % applicator (  Given by Other 09/05/19 0234)  sodium chloride 0.9 % bolus 500 mL (500 mLs Intravenous New Bag/Given 09/05/19 0628)      PROCEDURES  Procedures  3:02 AM Right naris epistaxis now hemostatic following cautery with silver nitrate and clamping of nostrils for 30 minutes.  6:13 AM Patient had a near syncopal episode after using the toilet and attempting to pull her pants back on.  She did not fall or pass out  completely.  She denies any chest pain and is back to her baseline mentally.  We will give a fluid bolus and check lab work.  Right naris still hemostatic.  ED DIAGNOSES  ICD-10-CM   1. Acute anterior epistaxis  R04.0   2. Near syncope  R55        Shanera Meske, Jenny Reichmann, MD 09/05/19 [phone]

## 2019-09-05 NOTE — Telephone Encounter (Signed)
Spoke with daughter who is aware of Dr Marlou Porch recommendations and instructions.  She reports patient keeps her heat turned up pretty high and she feels as if it is drying her down.  Recommended a humidifier to be used as well as the saline nasal spray.  Continue current medications and f/u with ENT if necessary.  Daughter very grateful for the c/b and information.

## 2019-09-11 ENCOUNTER — Ambulatory Visit: Payer: Medicare HMO | Admitting: Family Medicine

## 2019-09-15 ENCOUNTER — Other Ambulatory Visit: Payer: Self-pay

## 2019-09-15 DIAGNOSIS — I4891 Unspecified atrial fibrillation: Secondary | ICD-10-CM

## 2019-09-15 MED ORDER — APIXABAN 5 MG PO TABS: 5.0000 mg | ORAL_TABLET | Freq: Two times a day (BID) | ORAL | 2 refills | Status: DC

## 2019-09-15 MED ORDER — METOPROLOL SUCCINATE ER 50 MG PO TB24: 50.0000 mg | ORAL_TABLET | Freq: Every day | ORAL | 3 refills | Status: DC

## 2019-09-15 NOTE — Telephone Encounter (Signed)
Eliquis 5mg  refill request received, pt is 86yrs old, weight-62kg, Crea-0.61 on 09/05/2019, Diagnosis-Afib, and last seen by [name] on 05/27/2019. Dose is appropriate based on dosing criteria. Will send in refill to requested pharmacy.

## 2019-09-18 ENCOUNTER — Ambulatory Visit (INDEPENDENT_AMBULATORY_CARE_PROVIDER_SITE_OTHER): Payer: Medicare HMO | Admitting: Internal Medicine

## 2019-09-18 ENCOUNTER — Encounter: Payer: Self-pay | Admitting: Internal Medicine

## 2019-09-18 ENCOUNTER — Other Ambulatory Visit: Payer: Self-pay

## 2019-09-18 ENCOUNTER — Ambulatory Visit (HOSPITAL_BASED_OUTPATIENT_CLINIC_OR_DEPARTMENT_OTHER)
Admission: RE | Admit: 2019-09-18 | Discharge: 2019-09-18 | Disposition: A | Discharge status: Home / Self Care | Payer: Medicare HMO | Source: Ambulatory Visit | Attending: Internal Medicine | Admitting: Internal Medicine

## 2019-09-18 VITALS — BP 113/84 | HR 84 | Temp 96.9°F | Resp 16 | Ht 62.0 in | Wt 138.1 lb

## 2019-09-18 DIAGNOSIS — M549 Dorsalgia, unspecified: Secondary | ICD-10-CM | POA: Diagnosis not present

## 2019-09-18 MED ORDER — CYCLOBENZAPRINE HCL 5 MG PO TABS: 5.0000 mg | ORAL_TABLET | Freq: Three times a day (TID) | ORAL | 0 refills | Status: DC | PRN

## 2019-09-18 NOTE — Patient Instructions (Signed)
  STOP BY THE FIRST FLOOR:  get the XR   For pain:  Tylenol  500 mg OTC 2 tabs a day every 8 hours as needed for pain  Also take Flexeril, is a muscle relaxant, watch for excessive drowsiness.  If that is the case he stopped taking it  Do not take ibuprofen, naproxen or similar medications  Call if you are not gradually improving  Call anytime if the pain is severe or you develop a rash.

## 2019-09-18 NOTE — Progress Notes (Signed)
Pre visit review using our clinic review tool, if applicable. No additional management support is needed unless otherwise documented below in the visit note. 

## 2019-09-18 NOTE — Progress Notes (Signed)
   Subjective:    Patient ID: Carol [name], female    DOB: 17-Nov-1927, 86 y.o.   MRN: [id number]  DOS:  09/18/2019 Type of visit - description: Acute, here with her sister-in-law  1 day history of a stabbing, on and off pain located at the left upper back. Symptoms are not triggered by certain movements. This is not the first time this happened, previously muscle relaxants helped.  Denies fever chills No recent falls.  No rash No chest pain, difficulty breathing or cough No nausea, vomiting, diarrhea.  No blood in the stools.  Of note, she went to the ER 09/05/2019,Seen for a nosebleed, she is anticoagulated, CBC show a slightly decreased hemoglobin.  BMP satisfactory.  Review of Systems See above   Past Medical History:  Diagnosis Date  . A-fib ([place])   . Anemia   . Arrhythmia   . Blood transfusion without reported diagnosis   . Breast cancer ([place])   . Cancer ([place])   . Cataract   . CHF (congestive heart failure) ([place])   . Dyslipidemia   . Hyperlipidemia   . Hypertension   . Paroxysmal supraventricular tachycardia ([place])   . Stroke [facility])     Past Surgical History:  Procedure Laterality Date  . MASTECTOMY    . TONSILLECTOMY    . WRIST SURGERY          Objective:   Physical Exam Musculoskeletal:       Back:    BP 113/84 (BP Location: Left Arm, Patient Position: Sitting, Cuff Size: Small)   Pulse 84   Temp (!) 96.9 F (36.1 C) (Temporal)   Resp 16   Ht 5\' 2"  (1.575 m)   Wt 138 lb 2 oz (62.7 kg)   LMP  (LMP Unknown)   SpO2 98%   BMI 25.26 kg/m  General:   Well developed, NAD, BMI noted.  HEENT:  Normocephalic . Face symmetric, atraumatic Lungs:  CTA B Normal respiratory effort, no intercostal retractions, no accessory muscle use. Heart: Irregularly irregular no pretibial edema bilaterally  Abdomen:  Not distended, soft, non-tender. No rebound or rigidity. MSK: No TTP at the thoracic spine.  Move extremities without any problems.   Skin: No rash at  the back Neurologic:  alert & oriented X3.  Speech normal, gait appropriate for age and unassisted Psych--  Cognition and judgment appear intact.  Cooperative with normal attention span and concentration.  Behavior appropriate. No anxious or depressed appearing.     Assessment    86 year old female, PMH includes atrial fibrillation, HTN, MCI, osteopenia, h/o breast cancer, high cholesterol, presents with  Left upper back pain: As described above, this is not the first episode. Review of systems is benign, most likely MSK issue/spasm. For completeness we will get a chest x-ray. Otherwise we will recommend a heating pad, Tylenol, low-dose of Flexeril, watch for  excessive sedation. Call if not better, call if severe symptoms or a rash.  This visit occurred during the SARS-CoV-2 public health emergency.  Safety protocols were in place, including screening questions prior to the visit, additional usage of staff PPE, and extensive cleaning of exam room while observing appropriate contact time as indicated for disinfecting solutions.

## 2019-10-22 ENCOUNTER — Other Ambulatory Visit: Payer: Self-pay | Admitting: Cardiology

## 2019-10-22 DIAGNOSIS — I4891 Unspecified atrial fibrillation: Secondary | ICD-10-CM

## 2019-11-12 NOTE — Progress Notes (Deleted)
CARDIOLOGY OFFICE NOTE  Date:  11/12/2019    [name] Date of Birth: November 11, 1927 Medical Record [id number]  PCP:  Darreld Mclean, MD  Cardiologist:  Marisa Cyphers  No chief complaint on file.   History of Present Illness: Carol [name] is a 86 y.o. female who presents today for a 6 month check. Seen for Dr. Marlou Porch.   She has a history of PAF, mildly reduced ejection fraction of approximately 50%, prior stroke,breast cancer,and HTN.   Last seen here in December of 2019 by Dr. Marlou Porch - was off Eliquis, Toprol and Losartan.She was in NSR. There was concern for memory issues. Her heart rate was ok - Toprol was NOT resumed at that time.  I then saw her in August of 2020 - had progressive dementia/memory issues. She has someone that comes and physically gives her her medicines to ensure compliance. She was back in AF with inadequate rate control and I restarted the Toprol. On follow up visit in September she was doing well.   The patient {does/does not:200015} have symptoms concerning for COVID-19 infection (fever, chills, cough, or new shortness of breath).   Comes in today. Here with   Past Medical History:  Diagnosis Date  . A-fib ([place])   . Anemia   . Arrhythmia   . Blood transfusion without reported diagnosis   . Breast cancer ([place])   . Cancer ([place])   . Cataract   . CHF (congestive heart failure) ([place])   . Dyslipidemia   . Hyperlipidemia   . Hypertension   . Paroxysmal supraventricular tachycardia ([place])   . Stroke [facility])     Past Surgical History:  Procedure Laterality Date  . MASTECTOMY    . TONSILLECTOMY    . WRIST SURGERY       Medications: No outpatient medications have been marked as taking for the 11/17/19 encounter (Appointment) with Burtis Junes, NP.     Allergies: Allergies  Allergen Reactions  . Tramadol Other (See Comments)    seizure  . Sertraline Other (See Comments)  . Zoloft [Sertraline Hcl]     headache     Social History: The patient  reports that she has never smoked. She has never used smokeless tobacco. She reports that she does not drink alcohol or use drugs.   Family History: The patient's ***family history includes Epilepsy in her mother; Heart disease in her maternal aunt; Stroke in her father.   Review of Systems: Please see the history of present illness.   All other systems are reviewed and negative.   Physical Exam: VS:  LMP  (LMP Unknown)  .  BMI There is no height or weight on file to calculate BMI.  Wt Readings from Last 3 Encounters:  09/18/19 138 lb 2 oz (62.7 kg)  09/05/19 136 lb 11 oz (62 kg)  07/03/19 138 lb (62.6 kg)    General: Pleasant. Well developed, well nourished and in no acute distress.   HEENT: Normal.  Neck: Supple, no JVD, carotid bruits, or masses noted.  Cardiac: ***Regular rate and rhythm. No murmurs, rubs, or gallops. No edema.  Respiratory:  Lungs are clear to auscultation bilaterally with normal work of breathing.  GI: Soft and nontender.  MS: No deformity or atrophy. Gait and ROM intact.  Skin: Warm and dry. Color is normal.  Neuro:  Strength and sensation are intact and no gross focal deficits noted.  Psych: Alert, appropriate and with normal affect.   LABORATORY  DATA:  EKG:  EKG {ACTION; IS/IS GI:[id number] ordered today. This demonstrates ***.  Lab Results  Component Value Date   WBC 8.3 09/05/2019   HGB 11.5 (L) 09/05/2019   HCT 35.4 (L) 09/05/2019   PLT 235 09/05/2019   GLUCOSE 107 (H) 09/05/2019   CHOL 203 (H) 08/19/2018   TRIG 100.0 08/19/2018   HDL 69.10 08/19/2018   LDLCALC 114 (H) 08/19/2018   ALT 12 08/19/2018   AST 19 08/19/2018   NA 137 09/05/2019   K 4.1 09/05/2019   CL 101 09/05/2019   CREATININE 0.61 09/05/2019   BUN 23 09/05/2019   CO2 27 09/05/2019   TSH 1.940 09/11/2018   INR 1.11 10/03/2016   HGBA1C 5.6 08/19/2018     BNP (last 3 results) No results for input(s): BNP in the last 8760  hours.  ProBNP (last 3 results) No results for input(s): PROBNP in the last 8760 hours.   Other Studies Reviewed Today:   Assessment/Plan:  1. Persistent AF -   2. Chronic anticoagulation  3. Cardiomyopathy with mild LV systolic dysfunction - from prior outside echo noted - managed conservatively  4. Dementia  5. HTN  6. COVID-19 Education: The signs and symptoms of COVID-19 were discussed with the patient and how to seek care for testing (follow up with PCP or arrange E-visit).  The importance of social distancing, staying at home, hand hygiene and wearing a mask when out in public were discussed today.  Current medicines are reviewed with the patient today.  The patient does not have concerns regarding medicines other than what has been noted above.  The following changes have been made:  See above.  Labs/ tests ordered today include:   No orders of the defined types were placed in this encounter.    Disposition:   FU with *** in {gen number [id number] {Days to years:10300}.   Patient is agreeable to this plan and will call if any problems develop in the interim.   SignedTruitt Merle, NP  11/12/2019 7:55 AM  [facility] [address] [place], [place]  [address] Phone: [phone] Fax: [phone]

## 2019-11-17 ENCOUNTER — Ambulatory Visit: Payer: Medicare HMO | Admitting: Nurse Practitioner

## 2019-11-19 NOTE — Progress Notes (Signed)
CARDIOLOGY OFFICE NOTE  Date:  11/26/2019    Carol [name] Date of Birth: April 02, 1928 Medical Record [id number]  PCP:  Darreld Mclean, MD  Cardiologist:  Marisa Cyphers   Chief Complaint  Patient presents with  . Follow-up    Seen for Dr. Marlou Porch    History of Present Illness: Carol [name] is a 86 y.o. female who presents today for a follow up visit. Seen for Dr. Marlou Porch.   She has a history of PAF, mildly reduced ejection fraction of approximately 50%, prior stroke,breast cancer,and HTN.   Last seen here in December by Dr. Marlou Porch - was off Eliquis, Toprol and Losartan.She was in NSR. There was concern for memory issues. Her heart rate was ok - Toprol was NOT resumed at that time.  I then saw her last August - she has had progressive dementia/memory issues. She has someone that comes and physically gives her her medicines to ensure compliance. She was back in AF with inadequate rate control and I restarted the Toprol. On follow up with me last September - she was doing well. No awareness of her AF.   The patient does not have symptoms concerning for COVID-19 infection (fever, chills, cough, or new shortness of breath).   Comes in today. Here with her brother. Things are going well. She is doing good. No chest pain. Not short of breath. No falls. Tolerating her Eliquis. No falls. No real concerns. They have both had their vaccines.   Past Medical History:  Diagnosis Date  . A-fib ([place])   . Anemia   . Arrhythmia   . Blood transfusion without reported diagnosis   . Breast cancer ([place])   . Cancer ([place])   . Cataract   . CHF (congestive heart failure) ([place])   . Dyslipidemia   . Hyperlipidemia   . Hypertension   . Paroxysmal supraventricular tachycardia ([place])   . Stroke [facility])     Past Surgical History:  Procedure Laterality Date  . MASTECTOMY    . TONSILLECTOMY    . WRIST SURGERY       Medications: Current Meds  Medication Sig  . apixaban  (ELIQUIS) 5 MG TABS tablet Take 1 tablet (5 mg total) by mouth 2 (two) times daily.  Marland Kitchen donepezil (ARICEPT) 10 MG tablet Take 1 tablet (10 mg total) by mouth at bedtime.  Marland Kitchen losartan (COZAAR) 50 MG tablet Take 1 tablet (50 mg total) by mouth daily.  . metoprolol succinate (TOPROL-XL) 50 MG 24 hr tablet Take 1 tablet (50 mg total) by mouth daily. Take with or immediately following a meal.  . [DISCONTINUED] apixaban (ELIQUIS) 5 MG TABS tablet Take 1 tablet (5 mg total) by mouth 2 (two) times daily.     Allergies: Allergies  Allergen Reactions  . Tramadol Other (See Comments)    seizure  . Sertraline Other (See Comments)  . Zoloft [Sertraline Hcl]     headache    Social History: The patient  reports that she has never smoked. She has never used smokeless tobacco. She reports that she does not drink alcohol or use drugs.   Family History: The patient's family history includes Epilepsy in her mother; Heart disease in her maternal aunt; Stroke in her father.   Review of Systems: Please see the history of present illness.   All other systems are reviewed and negative.   Physical Exam: VS:  BP 114/72   Pulse 74   Ht 5\' 2"  (1.575 m)  Wt 137 lb 12.8 oz (62.5 kg)   LMP  (LMP Unknown)   SpO2 96%   BMI 25.20 kg/m  .  BMI Body mass index is 25.2 kg/m.  Wt Readings from Last 3 Encounters:  11/26/19 137 lb 12.8 oz (62.5 kg)  09/18/19 138 lb 2 oz (62.7 kg)  09/05/19 136 lb 11 oz (62 kg)    General: Pleasant. She looks younger than her stated age. Alert and in no acute distress.   Cardiac: Irregular irregular rhythm. Her rate is fine.  No murmurs, rubs, or gallops. No edema.  Respiratory:  Lungs are clear to auscultation bilaterally with normal work of breathing.  GI: Soft and nontender.  MS: No deformity or atrophy. Gait and ROM intact.  Skin: Warm and dry. Color is normal.  Neuro:  Strength and sensation are intact and no gross focal deficits noted.  Psych: Alert, appropriate and  with normal affect.   LABORATORY DATA:   EKG:  EKG is not ordered today.    Lab Results  Component Value Date   WBC 8.3 09/05/2019   HGB 11.5 (L) 09/05/2019   HCT 35.4 (L) 09/05/2019   PLT 235 09/05/2019   GLUCOSE 107 (H) 09/05/2019   CHOL 203 (H) 08/19/2018   TRIG 100.0 08/19/2018   HDL 69.10 08/19/2018   LDLCALC 114 (H) 08/19/2018   ALT 12 08/19/2018   AST 19 08/19/2018   NA 137 09/05/2019   K 4.1 09/05/2019   CL 101 09/05/2019   CREATININE 0.61 09/05/2019   BUN 23 09/05/2019   CO2 27 09/05/2019   TSH 1.940 09/11/2018   INR 1.11 10/03/2016   HGBA1C 5.6 08/19/2018     BNP (last 3 results) No results for input(s): BNP in the last 8760 hours.  ProBNP (last 3 results) No results for input(s): PROBNP in the last 8760 hours.   Other Studies Reviewed Today:   Assessment/Plan:  1. Persistent AF - managed with rate control and continued anticoagulation   2. Chronic anticoagulation - lab from January noted - no problems noted - this will be continued - refilled today to [facility].   3. Cardiomyopathy with mild LV systolic dysfunction - noted on outside echo - she is very well compensated. No symptoms noted.   4. HTN - BP is fine - no changes made today.   5. Dementia - seems to be doing ok. Family has someone overseeing her taking her medicines.   6. COVID-19 Education: The signs and symptoms of COVID-19 were discussed with the patient and how to seek care for testing (follow up with PCP or arrange E-visit).  The importance of social distancing, staying at home, hand hygiene and wearing a mask when out in public were discussed today. They have had their vaccines.   Current medicines are reviewed with the patient today.  The patient does not have concerns regarding medicines other than what has been noted above.  The following changes have been made:  See above.  Labs/ tests ordered today include:   No orders of the defined types were placed in this  encounter.    Disposition:   FU with me in 6 months.   Patient is agreeable to this plan and will call if any problems develop in the interim.   SignedTruitt Merle, NP  11/26/2019 3:43 PM  [facility] [address] [place],   [address] Phone: [phone] Fax: [phone]

## 2019-11-26 ENCOUNTER — Telehealth: Payer: Self-pay | Admitting: *Deleted

## 2019-11-26 ENCOUNTER — Ambulatory Visit (INDEPENDENT_AMBULATORY_CARE_PROVIDER_SITE_OTHER): Payer: Medicare HMO | Admitting: Nurse Practitioner

## 2019-11-26 ENCOUNTER — Encounter: Payer: Self-pay | Admitting: Nurse Practitioner

## 2019-11-26 ENCOUNTER — Other Ambulatory Visit: Payer: Self-pay

## 2019-11-26 VITALS — BP 114/72 | HR 74 | Ht 62.0 in | Wt 137.8 lb

## 2019-11-26 DIAGNOSIS — Z7901 Long term (current) use of anticoagulants: Secondary | ICD-10-CM

## 2019-11-26 DIAGNOSIS — Z7189 Other specified counseling: Secondary | ICD-10-CM | POA: Diagnosis not present

## 2019-11-26 DIAGNOSIS — I4891 Unspecified atrial fibrillation: Secondary | ICD-10-CM | POA: Diagnosis not present

## 2019-11-26 DIAGNOSIS — I4821 Permanent atrial fibrillation: Secondary | ICD-10-CM

## 2019-11-26 DIAGNOSIS — I10 Essential (primary) hypertension: Secondary | ICD-10-CM

## 2019-11-26 MED ORDER — APIXABAN 5 MG PO TABS: 5.0000 mg | ORAL_TABLET | Freq: Two times a day (BID) | ORAL | 2 refills | Status: DC

## 2019-11-26 NOTE — Patient Instructions (Addendum)
After Visit Summary:  We will be checking the following labs today - NONE   Medication Instructions:    Continue with your current medicines.   I refilled the Eliquis today   If you need a refill on your cardiac medications before your next appointment, please call your pharmacy.     Testing/Procedures To Be Arranged:  N/A  Follow-Up:   See me in 6 months    At [facility], you and your health needs are our priority.  As part of our continuing mission to provide you with exceptional heart care, we have created designated Provider Care Teams.  These Care Teams include your primary Cardiologist (physician) and Advanced Practice Providers (APPs -  Physician Assistants and Nurse Practitioners) who all work together to provide you with the care you need, when you need it.  Special Instructions:  . Stay safe, stay home, wash your hands for at least 20 seconds and wear a mask when out in public.  . It was good to talk with you today.    Call the [place] office at [phone] if you have any questions, problems or concerns.

## 2019-11-26 NOTE — Telephone Encounter (Signed)
Pt does not need a new prescription sent in, 3 month supply rx was sent in January with 2 refills.

## 2019-11-26 NOTE — Telephone Encounter (Signed)
Patient is here today for office visit requesting a refill for Eliquis 5 mg sent to [facility]. Thank you

## 2019-12-18 ENCOUNTER — Telehealth: Payer: Self-pay | Admitting: Family Medicine

## 2019-12-18 NOTE — Progress Notes (Signed)
  Chronic Care Management   Outreach Note  12/18/2019 Name: Carol [name] MRN: [id number] DOB: Aug 11, 1928  Referred by: Darreld Mclean, MD Reason for referral : No chief complaint on file.   An unsuccessful telephone outreach was attempted today. The patient was referred to the pharmacist for assistance with care management and care coordination.   Follow Up Plan:    [name] [employer] Scheduler

## 2020-01-01 ENCOUNTER — Other Ambulatory Visit: Payer: Self-pay | Admitting: Cardiology

## 2020-01-01 DIAGNOSIS — I4891 Unspecified atrial fibrillation: Secondary | ICD-10-CM

## 2020-02-03 ENCOUNTER — Telehealth: Payer: Self-pay | Admitting: Family Medicine

## 2020-02-03 NOTE — Progress Notes (Signed)
  Chronic Care Management   Outreach Note  02/03/2020 Name: ARUNA NESTLER MRN: [id number] DOB: 31-Jul-1928  Referred by: Darreld Mclean, MD Reason for referral : No chief complaint on file.   An unsuccessful telephone outreach was attempted today. The patient was referred to the pharmacist for assistance with care management and care coordination.   This note is not being shared with the patient for the following reason: To respect privacy (The patient or proxy has requested that the information not be shared).  Follow Up Plan:   Earney [place] [employer] Scheduler

## 2020-03-12 ENCOUNTER — Other Ambulatory Visit: Payer: Self-pay | Admitting: Family Medicine

## 2020-03-12 DIAGNOSIS — M8580 Other specified disorders of bone density and structure, unspecified site: Secondary | ICD-10-CM

## 2020-04-28 NOTE — Progress Notes (Addendum)
CARDIOLOGY OFFICE NOTE  Date:  05/12/2020    Carol [name] Date of Birth: [birth year] Medical Record #[id number]  PCP:  Darreld Mclean, MD  Cardiologist:  Marisa Cyphers    Chief Complaint  Patient presents with  . Follow-up    History of Present Illness: Carol [name] is a 86 y.o. female who presents today for a follow up visit. Seen for Dr. Marlou Porch.   She has a history of PAF, mildly reduced ejection fraction of approximately 50% per remote echo, prior stroke,breast cancer,and HTN.   Last seen here in December of 2019by Dr. Marlou Porch- was off Eliquis, Toprol and Losartan.She was in NSR. There was concern for memory issues. Her heart rate was ok - Toprol was NOT resumedat that time.  I have basically followed her since - she has had progressive dementia/memory issues. She has someone that comes and physically gives her her medicines to ensure compliance. She has been found to be back in AF with inadequate rate control and I restarted the Toprol.On follow up with me last in March - she was doing well. Typically comes with her brother.   Comes in today. Here with her niece today. Tanda is doing ok. No chest pain. Not short of breath. They still have someone coming routinely to physically give her her medicine so compliance is not an issue. She admits her memory is an issue. No falls. No bleeding. Her niece feels like she is doing well. No real concerns.   Past Medical History:  Diagnosis Date  . A-fib ([place])   . Anemia   . Arrhythmia   . Blood transfusion without reported diagnosis   . Breast cancer ([place])   . Cancer ([place])   . Cataract   . CHF (congestive heart failure) ([place])   . Dyslipidemia   . Hyperlipidemia   . Hypertension   . Paroxysmal supraventricular tachycardia ([place])   . Stroke [facility])     Past Surgical History:  Procedure Laterality Date  . MASTECTOMY    . TONSILLECTOMY    . WRIST SURGERY       Medications: Current Meds  Medication  Sig  . apixaban (ELIQUIS) 5 MG TABS tablet Take 1 tablet (5 mg total) by mouth 2 (two) times daily.  Marland Kitchen donepezil (ARICEPT) 10 MG tablet Take 1 tablet (10 mg total) by mouth at bedtime.  Marland Kitchen losartan (COZAAR) 50 MG tablet TAKE 1 TABLET EVERY DAY  . [DISCONTINUED] metoprolol succinate (TOPROL-XL) 50 MG 24 hr tablet Take 1 tablet (50 mg total) by mouth daily. Take with or immediately following a meal.     Allergies: Allergies  Allergen Reactions  . Tramadol Other (See Comments)    seizure  . Sertraline Other (See Comments)  . Zoloft [Sertraline Hcl]     headache    Social History: The patient  reports that she has never smoked. She has never used smokeless tobacco. She reports that she does not drink alcohol and does not use drugs.   Family History: The patient's family history includes Epilepsy in her mother; Heart disease in her maternal aunt; Stroke in her father.   Review of Systems: Please see the history of present illness.   All other systems are reviewed and negative.   Physical Exam: VS:  BP 120/70   Pulse (!) 55   Ht 5\' 2"  (1.575 m)   Wt 137 lb 9.6 oz (62.4 kg)   LMP  (LMP Unknown)   SpO2 96%  BMI 25.17 kg/m  .  BMI Body mass index is 25.17 kg/m.  Wt Readings from Last 3 Encounters:  05/12/20 137 lb 9.6 oz (62.4 kg)  11/26/19 137 lb 12.8 oz (62.5 kg)  09/18/19 138 lb 2 oz (62.7 kg)    General: Alert and in no acute distress. She looks good today. Her weight is stable.  Cardiac: Regular rate and rhythm today - but a little slow. No murmurs, rubs, or gallops. No edema.  Respiratory:  Lungs are clear to auscultation bilaterally with normal work of breathing.  GI: Soft and nontender.  MS: No deformity or atrophy. Gait and ROM intact.  Skin: Warm and dry. Color is normal.  Neuro:  Strength and sensation are intact and no gross focal deficits noted.  Psych: Alert, appropriate and with normal affect.   LABORATORY DATA:  EKG:  EKG is ordered today.  Personally  reviewed by me. This demonstrates sinus bradycardia - HR is 55 today.  Lab Results  Component Value Date   WBC 8.3 09/05/2019   HGB 11.5 (L) 09/05/2019   HCT 35.4 (L) 09/05/2019   PLT 235 09/05/2019   GLUCOSE 107 (H) 09/05/2019   CHOL 203 (H) 08/19/2018   TRIG 100.0 08/19/2018   HDL 69.10 08/19/2018   LDLCALC 114 (H) 08/19/2018   ALT 12 08/19/2018   AST 19 08/19/2018   NA 137 09/05/2019   K 4.1 09/05/2019   CL 101 09/05/2019   CREATININE 0.61 09/05/2019   BUN 23 09/05/2019   CO2 27 09/05/2019   TSH 1.940 09/11/2018   INR 1.11 10/03/2016   HGBA1C 5.6 08/19/2018     BNP (last 3 results) No results for input(s): BNP in the last 8760 hours.  ProBNP (last 3 results) No results for input(s): PROBNP in the last 8760 hours.   Other Studies Reviewed Today:  N/A   Assessment/Plan:  1. Persistent AF - back in NSR today - she is bradycardic - will cut the Toprol back to 25 mg. No problems with her Eliquis noted - needs surveillance labs today.   2. Chronic anticoagulation - no problems noted - lab today.   3. Cardiomyopathy with mild LV systolic dysfunction - noted on outside echo - she is quite compensated - NYHA I.   4. HTN - BP is fine - continue Toprol and Losartan.   5. Dementia - has oversight for her medicines.  6. Bradycardia - will cut her Toprol back given her age - BP is low normal.    Current medicines are reviewed with the patient today.  The patient does not have concerns regarding medicines other than what has been noted above.  The following changes have been made:  See above.  Labs/ tests ordered today include:    Orders Placed This Encounter  Procedures  . Basic metabolic panel  . CBC  . EKG 12-Lead     Disposition:   FU with Dr. Marlou Porch in 6 months.   Patient is agreeable to this plan and will call if any problems develop in the interim.   SignedTruitt Merle, NP  05/12/2020 2:15 PM  [facility] [address] [place], [place]  [address] Phone: [phone] Fax: [phone]

## 2020-05-12 ENCOUNTER — Encounter: Payer: Self-pay | Admitting: Nurse Practitioner

## 2020-05-12 ENCOUNTER — Ambulatory Visit: Payer: Medicare HMO | Admitting: Nurse Practitioner

## 2020-05-12 ENCOUNTER — Other Ambulatory Visit: Payer: Self-pay

## 2020-05-12 VITALS — BP 120/70 | HR 55 | Ht 62.0 in | Wt 137.6 lb

## 2020-05-12 DIAGNOSIS — Z7901 Long term (current) use of anticoagulants: Secondary | ICD-10-CM

## 2020-05-12 DIAGNOSIS — I4821 Permanent atrial fibrillation: Secondary | ICD-10-CM | POA: Diagnosis not present

## 2020-05-12 DIAGNOSIS — I10 Essential (primary) hypertension: Secondary | ICD-10-CM | POA: Diagnosis not present

## 2020-05-12 DIAGNOSIS — R001 Bradycardia, unspecified: Secondary | ICD-10-CM

## 2020-05-12 MED ORDER — METOPROLOL SUCCINATE ER 25 MG PO TB24: 25.0000 mg | ORAL_TABLET | Freq: Every day | ORAL | 3 refills | Status: DC

## 2020-05-12 NOTE — Patient Instructions (Addendum)
After Visit Summary:  We will be checking the following labs today - BMET and CBC   Medication Instructions:    Continue with your current medicines. BUT  I am cutting the Toprol to 25 mg a day - this has been sent to your pharmacy - I sent this to both your local pharmacy and to your mail order.    If you need a refill on your cardiac medications before your next appointment, please call your pharmacy.     Testing/Procedures To Be Arranged:  N/A  Follow-Up:   See Dr. Marlou Porch in 6 months - You will receive a reminder letter in the mail two months in advance. If you don't receive a letter, please call our office to schedule the follow-up appointment.     At [facility], you and your health needs are our priority.  As part of our continuing mission to provide you with exceptional heart care, we have created designated Provider Care Teams.  These Care Teams include your primary Cardiologist (physician) and Advanced Practice Providers (APPs -  Physician Assistants and Nurse Practitioners) who all work together to provide you with the care you need, when you need it.  Special Instructions:  . Stay safe, wash your hands for at least 20 seconds and wear a mask when needed.  . It was good to talk with you both today.    Call the [place] office at [phone] if you have any questions, problems or concerns.

## 2020-05-13 LAB — CBC
Hematocrit: 37.4 % (ref 34.0–46.6)
Hemoglobin: 12.3 g/dL (ref 11.1–15.9)
MCH: 29.1 pg (ref 26.6–33.0)
MCHC: 32.9 g/dL (ref 31.5–35.7)
MCV: 89 fL (ref 79–97)
Platelets: 219 10*3/uL (ref 150–450)
RBC: 4.22 x10E6/uL (ref 3.77–5.28)
RDW: 14 % (ref 11.7–15.4)
WBC: 6.4 10*3/uL (ref 3.4–10.8)

## 2020-05-13 LAB — BASIC METABOLIC PANEL
BUN/Creatinine Ratio: 17 (ref 12–28)
BUN: 12 mg/dL (ref 8–27)
CO2: 24 mmol/L (ref 20–29)
Calcium: 9.5 mg/dL (ref 8.7–10.3)
Chloride: 101 mmol/L (ref 96–106)
Creatinine, Ser: 0.69 mg/dL (ref 0.57–1.00)
GFR calc Af Amer: 89 mL/min/{1.73_m2} (ref >59)
GFR calc non Af Amer: 77 mL/min/{1.73_m2} (ref >59)
Glucose: 93 mg/dL (ref 65–99)
Potassium: 4.5 mmol/L (ref 3.5–5.2)
Sodium: 139 mmol/L (ref 134–144)

## 2020-06-08 NOTE — Progress Notes (Signed)
[facility] at [employer] [place], [place], [place] [address] [phone]  Date:  06/10/2020   Name:  Carol [name]   DOB:  November 17, 1927   MRN:  [id number]  PCP:  Darreld Mclean, MD    Chief Complaint: Flu Vaccine (flu shot), Hyperlipidemia, Hypertension, and Dementia   History of Present Illness:  Carol [name] is a 86 y.o. very pleasant female patient who presents with the following:  Elderly lady here today for a follow-up visit- history of a fib on eliquis, hyperlipidemia, HTN, mild dementia, breast cancer, prior stroke Last seen by myself about one year ago when she was having nosebleeds- this seemed to stop, they have added a humidifier to her home  No other bleeding  Her brother's wife, [name], is one of her main support people  covid vaccine- done already, reminded about booster if applicable  Tetanus- pt does not want a routine booster today  Flu vaccine- give today  Mammogram?  Not needed, s/p double mastectomy   Seen by cardiology in September: Assessment/Plan: 1. Persistent AF - back in NSR today - she is bradycardic - will cut the Toprol back to 25 mg. No problems with her Eliquis noted - needs surveillance labs today.  2. Chronic anticoagulation - no problems noted - lab today.  3. Cardiomyopathy with mild LV systolic dysfunction - noted on outside echo - she is quite compensated - NYHA I.  4. HTN - BP is fine - continue Toprol and Losartan.  5. Dementia - has oversight for her medicines. 6. Bradycardia - will cut her Toprol back given her age - BP is low normal.     Patient reports no particular concerns today.  She is feeling well, she does not get formal exercise but does quite a bit of walking Patient Active Problem List   Diagnosis Date Noted  . Paroxysmal atrial fibrillation ([place]) 08/13/2018  . Right knee pain 11/16/2017  . Osteopenia 11/12/2016  . Mild cognitive disorder 10/10/2016  .  Chronic anticoagulation 10/04/2016  . History of atrial fibrillation 10/04/2016  . Essential hypertension, benign 09/01/2016  . Paroxysmal supraventricular tachycardia ([place]) 09/01/2016  . Hyperlipidemia 09/01/2016    Past Medical History:  Diagnosis Date  . A-fib ([place])   . Anemia   . Arrhythmia   . Blood transfusion without reported diagnosis   . Breast cancer ([place])   . Cancer ([place])   . Cataract   . CHF (congestive heart failure) ([place])   . Dyslipidemia   . Hyperlipidemia   . Hypertension   . Paroxysmal supraventricular tachycardia ([place])   . Stroke [facility])     Past Surgical History:  Procedure Laterality Date  . MASTECTOMY    . TONSILLECTOMY    . WRIST SURGERY      Social History   Tobacco Use  . Smoking status: Never Smoker  . Smokeless tobacco: Never Used  Vaping Use  . Vaping Use: Never used  Substance Use Topics  . Alcohol use: No  . Drug use: No    Family History  Problem Relation Age of Onset  . Epilepsy Mother   . Stroke Father   . Heart disease Maternal Aunt     Allergies  Allergen Reactions  . Tramadol Other (See Comments)    seizure  . Sertraline Other (See Comments)  . Zoloft [Sertraline Hcl]     headache    Medication list has been reviewed and updated.  Current Outpatient Medications on File Prior to Visit  Medication Sig Dispense Refill  . apixaban (ELIQUIS) 5 MG TABS tablet Take 1 tablet (5 mg total) by mouth 2 (two) times daily. 180 tablet 2  . losartan (COZAAR) 50 MG tablet TAKE 1 TABLET EVERY DAY 90 tablet 3  . metoprolol succinate (TOPROL XL) 25 MG 24 hr tablet Take 1 tablet (25 mg total) by mouth daily. 90 tablet 3   No current facility-administered medications on file prior to visit.    Review of Systems:  As per HPI- otherwise negative.   Physical Examination: Vitals:   06/10/20 1340  BP: 126/62  Pulse: 68  SpO2: 97%   Vitals:   06/10/20 1340  Weight: 141 lb (64 kg)   Body mass index is 25.79 kg/m. Ideal Body  Weight:    GEN: no acute distress.  Looks great for age S/p bilateral mastectomy  HEENT: Atraumatic, Normocephalic.  Ears and Nose: No external deformity. CV: RRR, No M/G/R. No JVD. No thrill. No extra heart sounds.  Currently in sinus rhythm PULM: CTA B, no wheezes, crackles, rhonchi. No retractions. No resp. distress. No accessory muscle use. ABD: S, NT, ND, +BS. No rebound. No HSM. EXTR: No c/c/e PSYCH: Normally interactive. Conversant.    Assessment and Plan: Flu vaccine need - Plan: Flu Vaccine QUAD High Dose(Fluad)  Recurrent epistaxis  Essential hypertension, benign  Atrial fibrillation, unspecified type [facility])  Patient today for a follow-up visit Flu vaccine given They are not sure what type of COVID-19 vaccine she received.  Encouraged a booster if Pfizer and appropriate timing She is currently in sinus rhythm, atrial fibrillation is treated appropriately Blood pressure looks good  We wish her happy 89th birthday in advance, asked her to follow up in 6 months Tetanus vaccine if any dirty wound  This visit occurred during the SARS-CoV-2 public health emergency.  Safety protocols were in place, including screening questions prior to the visit, additional usage of staff PPE, and extensive cleaning of exam room while observing appropriate contact time as indicated for disinfecting solutions.    Signed Lamar Blinks, MD

## 2020-06-10 ENCOUNTER — Ambulatory Visit (INDEPENDENT_AMBULATORY_CARE_PROVIDER_SITE_OTHER): Payer: Medicare HMO | Admitting: Family Medicine

## 2020-06-10 ENCOUNTER — Encounter: Payer: Self-pay | Admitting: Family Medicine

## 2020-06-10 ENCOUNTER — Other Ambulatory Visit: Payer: Self-pay

## 2020-06-10 VITALS — BP 126/62 | HR 68 | Wt 141.0 lb

## 2020-06-10 DIAGNOSIS — I4891 Unspecified atrial fibrillation: Secondary | ICD-10-CM | POA: Diagnosis not present

## 2020-06-10 DIAGNOSIS — Z23 Encounter for immunization: Secondary | ICD-10-CM | POA: Diagnosis not present

## 2020-06-10 DIAGNOSIS — I10 Essential (primary) hypertension: Secondary | ICD-10-CM

## 2020-06-10 DIAGNOSIS — R040 Epistaxis: Secondary | ICD-10-CM

## 2020-06-10 NOTE — Patient Instructions (Signed)
It was wonderful to see you again today!  Take care and please see me in about 6 months At next routine blood draw consider having a cholesterol panel done

## 2020-09-20 ENCOUNTER — Other Ambulatory Visit: Payer: Self-pay | Admitting: Nurse Practitioner

## 2020-09-20 DIAGNOSIS — I4891 Unspecified atrial fibrillation: Secondary | ICD-10-CM

## 2020-09-21 MED ORDER — APIXABAN 5 MG PO TABS: 5.0000 mg | ORAL_TABLET | Freq: Two times a day (BID) | ORAL | 1 refills | Status: DC

## 2020-09-21 NOTE — Addendum Note (Signed)
Addended by: Johny Shock B on: 09/21/2020 08:01 AM   Modules accepted: Orders

## 2020-09-21 NOTE — Telephone Encounter (Signed)
Prescription refill request for Eliquis received.  Indication: Afib Last office visit: Servando Snare 05/12/2020 Scr: 0.69, 05/12/2020 Age: 87 yo  Weight: 64 kg   Prescription refill sent.

## 2020-10-01 ENCOUNTER — Other Ambulatory Visit: Payer: Self-pay | Admitting: Physician Assistant

## 2020-10-01 DIAGNOSIS — U071 COVID-19: Secondary | ICD-10-CM

## 2020-10-02 ENCOUNTER — Other Ambulatory Visit: Payer: Self-pay | Admitting: Physician Assistant

## 2020-10-02 ENCOUNTER — Telehealth (HOSPITAL_COMMUNITY): Payer: Self-pay | Admitting: Pharmacist

## 2020-10-02 MED ORDER — NIRMATRELVIR/RITONAVIR (PAXLOVID)TABLET: 3.0000 | ORAL_TABLET | Freq: Two times a day (BID) | ORAL | 0 refills | Status: AC

## 2020-10-02 MED ORDER — MOLNUPIRAVIR EUA 200MG CAPSULE: 4.0000 | ORAL_CAPSULE | Freq: Two times a day (BID) | ORAL | 0 refills | Status: DC

## 2020-10-02 MED FILL — MOLNUPIRAVIR 200 MG CAPS: 200 | 5 days supply | Qty: 40 | Fill #0

## 2020-10-02 MED FILL — PAXLOVID 20 X 150 MG & 10 X: 20 X 150 MG | 5 days supply | Qty: 30 | Fill #0

## 2020-10-02 NOTE — Addendum Note (Signed)
Addended by: Eileen Stanford on: 10/02/2020 10:44 AM   Modules accepted: Orders

## 2020-10-02 NOTE — Addendum Note (Signed)
Addended by: Eileen Stanford on: 10/02/2020 09:22 AM   Modules accepted: Orders

## 2020-10-02 NOTE — Telephone Encounter (Signed)
Patient was prescribed oral covid treatment paxlovid and treatment note was reviewed. Medication has been received by [place] and reviewed for appropriateness.  Drug Interactions or Dosage Adjustments Noted: decrease dose of Eliquis by 1/2-caregiver aware & cutting tablets  Delivery Method: pickup  Patient contacted for counseling on telephone and verbalized understanding.   Delivery or Pick-Up Date:10/02/20   Marye Round 10/02/2020, 11:58 AM [place] Pharmacist Phone# [phone]

## 2020-10-02 NOTE — Progress Notes (Signed)
Outpatient Oral COVID Treatment Note  I connected with Carol Carol [name] Carol [name] on 10/02/2020/9:16 AM by telephone and verified that I am speaking with Carol Carol [name] correct person using two identifiers.  I discussed Carol Carol [name] limitations, risks, security, and privacy concerns of performing an evaluation and management service by telephone and Carol Carol [name] availability of in person appointments. I also discussed with Carol Carol [name] Carol [name] that there may be a Carol [name] responsible charge related to this service. Carol Carol [name] Carol [name] expressed understanding and agreed to proceed.  Carol [name] location: home  Provider location: office   Diagnosis: COVID-19 infection  Purpose of visit: Discussion of potential use of Molnupiravir or Paxlovid, a new treatment for mild to moderate COVID-19 viral infection in non-hospitalized patients.   Subjective: Carol [name] is a 87 y.o. female who has been diagnosed with COVID 19 viral infection.  Their symptoms began on 2/2 with cough and nasal congestion.    Past Medical History:  Diagnosis Date  . A-fib ([place])   . Anemia   . Arrhythmia   . Blood transfusion without reported diagnosis   . Breast cancer ([place])   . Cancer ([place])   . Cataract   . CHF (congestive heart failure) ([place])   . Dyslipidemia   . Hyperlipidemia   . Hypertension   . Paroxysmal supraventricular tachycardia ([place])   . Stroke [facility])     Allergies  Allergen Reactions  . Tramadol Other (See Comments)    seizure  . Sertraline Other (See Comments)  . Zoloft [Sertraline Hcl]     headache     Current Outpatient Medications:  .  apixaban (ELIQUIS) 5 MG TABS tablet, Take 1 tablet (5 mg total) by mouth 2 (two) times daily., Disp: 180 tablet, Rfl: 1 .  losartan (COZAAR) 50 MG tablet, TAKE 1 TABLET EVERY DAY, Disp: 90 tablet, Rfl: 3 .  metoprolol succinate (TOPROL XL) 25 MG 24 hr tablet, Take 1 tablet (25 mg total) by mouth daily., Disp: 90 tablet, Rfl: 3  Objective: Carol [name] sounds good on phone.  They are in no apparent distress.  Breathing is non  labored.  Mood and behavior are normal.  Laboratory Data:  No results found for this or any previous visit (from Carol Carol [name] past 2160 hour(s)).   Assessment: 87 y.o. female with mild/moderate COVID 19 viral infection diagnosed on 2/4 at high risk for progression to severe COVID 19.  Plan:  This Carol [name] is a 87 y.o. female that meets Carol Carol [name] following criteria for Emergency Use Authorization of: Paxlovid 1. Age >12 yr AND > 40 kg 2. SARS-COV-2 positive test 3. Symptom onset < 5 days 4. Mild-to-moderate COVID disease with high risk for severe progression to hospitalization or death  I have spoken and communicated Carol Carol [name] following to Carol Carol [name] Carol [name] or parent/caregiver regarding: 1. Paxlovid is an unapproved drug that is authorized for use under an Emergency Use Authorization.  2. There are no adequate, approved, available products for Carol Carol [name] treatment of COVID-19 in adults who have mild-to-moderate COVID-19 and are at high risk for progressing to severe COVID-19, including hospitalization or death. 3. Other therapeutics are currently authorized. For additional information on all products authorized for treatment or prevention of COVID-19, please see [email or web address].  4. There are benefits and risks of taking this treatment as outlined in Carol Carol [name] "Fact Sheet for Patients and Caregivers."  5. "Fact Sheet for Patients and Caregivers" was reviewed with Carol [name]. A hard copy will be provided to Carol [name] from pharmacy prior to Carol Carol [name] Carol [name] receiving treatment. 6. Patients should continue to self-isolate and use infection control  measures (e.g., wear mask, isolate, social distance, avoid sharing personal items, clean and disinfect "high touch" surfaces, and frequent handwashing) according to CDC guidelines.  7. Carol Carol [name] Carol [name] or parent/caregiver has Carol Carol [name] option to accept or refuse treatment. 8. Carol [name] medication history was  reviewed for potential drug interactions:Interaction with home meds: Eliquis, will take half.  Carol [name]'s GFR was calculated to be >60, and they were therefore prescribed Normal dose (GFR>60) - nirmatrelvir 150mg  tab (2 tablet) by mouth twice daily AND ritonavir 100mg  tab (1 tablet) by mouth twice daily . She has been eating and drinking and well. Labs checked a little over 3 months ago and she has had persistently normal renal function.   After reviewing above information with Carol Carol [name] Carol [name], Carol Carol [name] Carol [name] agrees to receive Paxlovid.  Follow up instructions:    . Take prescription BID x 5 days as directed . Reach out to pharmacist for counseling on medication if desired . For concerns regarding further COVID symptoms please follow up with your PCP or urgent care . For urgent or life-threatening issues, seek care at your local emergency department  Carol Carol [name] Carol [name] was provided an opportunity to ask questions, and all were answered. Carol Carol [name] Carol [name] agreed with Carol Carol [name] plan and demonstrated an understanding of Carol Carol [name] instructions.   Script sent to [facility] and opted to pick up RX.  Carol Carol [name] Carol [name] was advised to call their PCP or seek an in-person evaluation if Carol Carol [name] symptoms worsen or if Carol Carol [name] condition fails to improve as anticipated.   I provided 20 minutes of non face-to-face telephone visit time during this encounter, and > 50% was spent counseling as documented under my assessment & plan.  Angelena Form, PA-C 10/02/2020 /9:16 AM

## 2020-10-18 DIAGNOSIS — K5790 Diverticulosis of intestine, part unspecified, without perforation or abscess without bleeding: Secondary | ICD-10-CM | POA: Diagnosis not present

## 2020-10-18 DIAGNOSIS — R109 Unspecified abdominal pain: Secondary | ICD-10-CM | POA: Diagnosis not present

## 2020-10-18 DIAGNOSIS — R1031 Right lower quadrant pain: Secondary | ICD-10-CM | POA: Diagnosis not present

## 2020-10-18 DIAGNOSIS — N390 Urinary tract infection, site not specified: Secondary | ICD-10-CM | POA: Diagnosis not present

## 2020-10-18 DIAGNOSIS — R319 Hematuria, unspecified: Secondary | ICD-10-CM | POA: Diagnosis not present

## 2020-10-19 ENCOUNTER — Telehealth: Payer: Self-pay

## 2020-10-19 NOTE — Telephone Encounter (Signed)
Nurse Assessment Nurse: Jearld Pies, RN, Lovena Le Date/Time Eilene Ghazi Time): 10/18/2020 3:01:37 PM Confirm and document reason for call. If symptomatic, describe symptoms. ---Caller states his sister is having pain in her lower right side and it is getting worse. Pain started a couple days ago. Current pain level 7/10. Has not taken any pain reliever. Drinking fluids and urinating normally. Denies difficulty breathing, chest pain, urinary symptoms, fever or any other symptoms at this time. Does the patient have any new or worsening symptoms? ---Yes Will a triage be completed? ---Yes Related visit to physician within the last 2 weeks? ---No Does the PT have any chronic conditions? (i.e. diabetes, asthma, this includes High risk factors for pregnancy, etc.) ---No Is this a behavioral health or substance abuse call? ---No Guidelines Guideline Title Affirmed Question Affirmed Notes Nurse Date/Time Eilene Ghazi Time) Abdominal Pain - Female [1] MILD-MODERATE pain AND [2] constant AND [3] present > 2 hours Jake Bathe 10/18/2020 3:04:33 PM Disp. Time Eilene Ghazi Time) Disposition Final User 10/18/2020 3:12:05 PM See HCP within 4 Hours (or PCP triage) Yes Jearld Pies, RN, Lovena Le PLEASE NOTE: All timestamps contained within this report are represented as [place] Standard Time. CONFIDENTIALTY NOTICE: This fax transmission is intended only for the addressee. It contains information that is legally privileged, confidential or otherwise protected from use or disclosure. If you are not the intended recipient, you are strictly prohibited from reviewing, disclosing, copying using or disseminating any of this information or taking any action in reliance on or regarding this information. If you have received this fax in error, please notify us immediately by telephone so that we can arrange for its return to [place]. Phone: [phone], Toll-Free: [phone], Fax: [phone] Page: 2 of 2 Call Id: [id number] [place]  Disagree/Comply Comply Caller Understands Yes PreDisposition Go to Urgent Care/Walk-In Clinic Care Advice Given Per Guideline SEE HCP (OR PCP TRIAGE) WITHIN 4 HOURS: * You become worse CALL BACK IF: Comments User: Malissa Hippo, RN Date/Time (Eastern Time): 10/18/2020 3:10:31 PM Contacted back line at office and no appt open. Advised to visit UC within 4 hours. Caller verbalizes understanding. Referrals GO TO FACILITY OTHER - SPECIFY

## 2020-10-19 NOTE — Telephone Encounter (Signed)
Patient stats she is going to urgent care

## 2020-11-11 ENCOUNTER — Other Ambulatory Visit: Payer: Self-pay

## 2020-11-11 ENCOUNTER — Ambulatory Visit: Payer: Medicare HMO | Admitting: Cardiology

## 2020-11-11 ENCOUNTER — Encounter: Payer: Self-pay | Admitting: Cardiology

## 2020-11-11 VITALS — BP 120/60 | HR 108 | Ht 62.0 in | Wt 134.0 lb

## 2020-11-11 DIAGNOSIS — I4821 Permanent atrial fibrillation: Secondary | ICD-10-CM

## 2020-11-11 DIAGNOSIS — I10 Essential (primary) hypertension: Secondary | ICD-10-CM

## 2020-11-11 NOTE — Patient Instructions (Signed)

## 2020-11-11 NOTE — Progress Notes (Signed)
Cardiology Office Note:    Date:  11/11/2020   ID:  [name], DOB 12-22-1927, MRN [id number]  PCP:  Darreld Mclean, MD   [place]  Cardiologist:  Candee Furbish, MD  Advanced Practice Provider:  No care team member to display Electrophysiologist:  None       Referring MD: Darreld Mclean, MD     History of Present Illness:    Carol [name] is a 87 y.o. female here for follow-up of paroxysmal atrial fibrillation low normal ejection fraction 50%, prior stroke, breast cancer hypertension.  Seen previously by [name].    Overall she is doing quite well without any symptoms.  No shortness of breath no syncope no chest pain.  Today she is in atrial fibrillation.  There are family members by marriage with Carol [name].  Past Medical History:  Diagnosis Date  . A-fib ([place])   . Anemia   . Arrhythmia   . Blood transfusion without reported diagnosis   . Breast cancer ([place])   . Cancer ([place])   . Cataract   . CHF (congestive heart failure) ([place])   . Dyslipidemia   . Hyperlipidemia   . Hypertension   . Paroxysmal supraventricular tachycardia ([place])   . Stroke [facility])     Past Surgical History:  Procedure Laterality Date  . MASTECTOMY    . TONSILLECTOMY    . WRIST SURGERY      Current Medications: Current Meds  Medication Sig  . apixaban (ELIQUIS) 5 MG TABS tablet Take 1 tablet (5 mg total) by mouth 2 (two) times daily.  Marland Kitchen losartan (COZAAR) 50 MG tablet TAKE 1 TABLET EVERY DAY  . metoprolol succinate (TOPROL XL) 25 MG 24 hr tablet Take 1 tablet (25 mg total) by mouth daily.     Allergies:   Tramadol, Sertraline, and Zoloft [sertraline hcl]   Social History   Socioeconomic History  . Marital status: Widowed    Spouse name: Not on file  . Number of children: 2  . Years of education: 2 years college  . Highest education level: Not on file  Occupational History  . Occupation: Retired  Tobacco Use  . Smoking status: Never  Smoker  . Smokeless tobacco: Never Used  Vaping Use  . Vaping Use: Never used  Substance and Sexual Activity  . Alcohol use: No  . Drug use: No  . Sexual activity: Not on file  Other Topics Concern  . Not on file  Social History Narrative   Lives in a retirement community.   Right-handed.   Only drinks caffeine some days.   Social Determinants of Health   Financial Resource Strain: Not on file  Food Insecurity: Not on file  Transportation Needs: Not on file  Physical Activity: Not on file  Stress: Not on file  Social Connections: Not on file     Family History: The patient's family history includes Epilepsy in her mother; Heart disease in her maternal aunt; Stroke in her father.  ROS:   Please see the history of present illness.    All other systems reviewed and are negative.    Recent Labs: 05/12/2020: BUN 12; Creatinine, Ser 0.69; Hemoglobin 12.3; Platelets 219; Potassium 4.5; Sodium 139  Recent Lipid Panel    Component Value Date/Time   CHOL 203 (H) 08/19/2018 1350   CHOL 182 09/12/2016 0943   TRIG 100.0 08/19/2018 1350   HDL 69.10 08/19/2018 1350   HDL 81 09/12/2016 0943  CHOLHDL 3 08/19/2018 1350   VLDL 20.0 08/19/2018 1350   LDLCALC 114 (H) 08/19/2018 1350   LDLCALC 87 09/12/2016 0943   EKG 11/11/2020-atrial fibrillation 83 with nonspecific ST-T wave change  Risk Assessment/Calculations:      Physical Exam:    VS:  BP 120/60 (BP Location: Left Arm, Patient Position: Sitting, Cuff Size: Normal)   Pulse (!) 108   Ht 5\' 2"  (1.575 m)   Wt 134 lb (60.8 kg)   LMP  (LMP Unknown)   SpO2 98%   BMI 24.51 kg/m     Wt Readings from Last 3 Encounters:  11/11/20 134 lb (60.8 kg)  06/10/20 141 lb (64 kg)  05/12/20 137 lb 9.6 oz (62.4 kg)     GEN: Well nourished, well developed in no acute distress HEENT: Normal NECK: No JVD; No carotid bruits LYMPHATICS: No lymphadenopathy CARDIAC: irreg irreg, no murmurs, rubs, gallops RESPIRATORY:  Clear to  auscultation without rales, wheezing or rhonchi  ABDOMEN: Soft, non-tender, non-distended MUSCULOSKELETAL:  No edema; No deformity  SKIN: Warm and dry NEUROLOGIC:  Alert and oriented x 3 PSYCHIATRIC:  Normal affect   ASSESSMENT:    1. Essential hypertension   2. Permanent atrial fibrillation (HCC)    PLAN:    In order of problems listed above:  Persistent atrial fibrillation -Was in normal sinus rhythm during last visit with [name].  Bradycardic.  She cut her Toprol back to 25 at that time.  Today she is back in atrial fibrillation.  Since she is feeling well without any symptoms, no need for cardioversion.  Continue with current metoprolol 25 mg because of prior bradycardia.  Chronic anticoagulations -Continuing with Eliquis.  No bleeding.  Cardiomyopathy with mild LV systolic dysfunction -NYHA class I.  Doing well.  On low-dose losartan 50 mg  Essential hypertension blood pressure is fine Toprol and losartan currently being utilized.  Dementia -Oversight for her medicines  Prior bradycardia -Toprol was cut back.  6 month follow up.    Medication Adjustments/Labs and Tests Ordered: Current medicines are reviewed at length with the patient today.  Concerns regarding medicines are outlined above.  Orders Placed This Encounter  Procedures  . EKG 12-Lead   No orders of the defined types were placed in this encounter.   Patient Instructions  Medication Instructions:  The current medical regimen is effective;  continue present plan and medications.  *If you need a refill on your cardiac medications before your next appointment, please call your pharmacy*  Follow-Up: At [facility], you and your health needs are our priority.  As part of our continuing mission to provide you with exceptional heart care, we have created designated Provider Care Teams.  These Care Teams include your primary Cardiologist (physician) and Advanced Practice Providers (APPs -  Physician  Assistants and Nurse Practitioners) who all work together to provide you with the care you need, when you need it.  We recommend signing up for the patient portal called "MyChart".  Sign up information is provided on this After Visit Summary.  MyChart is used to connect with patients for Virtual Visits (Telemedicine).  Patients are able to view lab/test results, encounter notes, upcoming appointments, etc.  Non-urgent messages can be sent to your provider as well.   To learn more about what you can do with MyChart, go to [email or web address].    Your next appointment:   6 month(s)  The format for your next appointment:   In Person  Provider:   Candee Furbish,  MD   Thank you for choosing [facility]!!        Signed, Candee Furbish, MD  11/11/2020 2:10 PM    [place]

## 2020-12-05 NOTE — Progress Notes (Signed)
[place] at [facility] [address], [place], [place] [address] [phone]  Date:  12/08/2020   Name:  Carol [name]   DOB:  02/26/1928   MRN:  [id number]  PCP:  Darreld Mclean, MD    Chief Complaint: Hypertension (6 mont follow up/)   History of Present Illness:  Carol [name] is a 87 y.o. very pleasant female patient who presents with the following:  Here today for 69-month follow-up visit.  Last seen by myself in October- history of a fib on eliquis, hyperlipidemia, HTN,mild dementia, breast cancer, prior stroke  Her sister-in-law, [name] is one of her main support people Her brother [name] is with her today  She had COVID-19 in February of this year-she was treated with Paxlovid She saw her cardiologist, Dr. Marlou Porch last month Persistent atrial fibrillation -Was in normal sinus rhythm during last visit with [name].  Bradycardic.  She cut her Toprol back to 25 at that time.  Today she is back in atrial fibrillation.  Since she is feeling well without any symptoms, no need for cardioversion.  Continue with current metoprolol 25 mg because of prior bradycardia. Chronic anticoagulations -Continuing with Eliquis.  No bleeding. Cardiomyopathy with mild LV systolic dysfunction -NYHA class I.  Doing well.  On low-dose losartan 50 mg Essential hypertension blood pressure is fine Toprol and losartan currently being utilized. Dementia -Oversight for her medicines Prior bradycardia -Toprol was cut back.  COVID-19 series- had 3 shots so far  They do plan to do a 4th dose when it becomes available at her assisted living facility Tetanus vaccine-uncertain, likely over 10 years ago No mammogram, status post double mastectomy BMP, CBC done in September  We discussed blood work today.  They would like to avoid unless absolutely necessary.  Okay to defer until next visit  Her brother reports that she does fall more frequently, she  fell about a week ago at home.  Thankfully she did not get injured She has known history of osteopenia  Eliquis Losartan Toprol-XL 25  Patient Active Problem List   Diagnosis Date Noted  . Paroxysmal atrial fibrillation ([place]) 08/13/2018  . Right knee pain 11/16/2017  . Osteopenia 11/12/2016  . Mild cognitive disorder 10/10/2016  . Chronic anticoagulation 10/04/2016  . History of atrial fibrillation 10/04/2016  . Essential hypertension, benign 09/01/2016  . Paroxysmal supraventricular tachycardia ([place]) 09/01/2016  . Hyperlipidemia 09/01/2016    Past Medical History:  Diagnosis Date  . A-fib ([place])   . Anemia   . Arrhythmia   . Blood transfusion without reported diagnosis   . Breast cancer ([place])   . Cancer ([place])   . Cataract   . CHF (congestive heart failure) ([place])   . Dyslipidemia   . Hyperlipidemia   . Hypertension   . Paroxysmal supraventricular tachycardia ([place])   . Stroke [facility])     Past Surgical History:  Procedure Laterality Date  . MASTECTOMY    . TONSILLECTOMY    . WRIST SURGERY      Social History   Tobacco Use  . Smoking status: Never Smoker  . Smokeless tobacco: Never Used  Vaping Use  . Vaping Use: Never used  Substance Use Topics  . Alcohol use: No  . Drug use: No    Family History  Problem Relation Age of Onset  . Epilepsy Mother   . Stroke Father   . Heart disease Maternal Aunt     Allergies  Allergen Reactions  . Tramadol Other (See Comments)    seizure  . Sertraline Other (See Comments)  . Zoloft [Sertraline Hcl]     headache    Medication list has been reviewed and updated.  Current Outpatient Medications on File Prior to Visit  Medication Sig Dispense Refill  . apixaban (ELIQUIS) 5 MG TABS tablet Take 1 tablet (5 mg total) by mouth 2 (two) times daily. 180 tablet 1  . losartan (COZAAR) 50 MG tablet TAKE 1 TABLET EVERY DAY 90 tablet 3  . metoprolol succinate (TOPROL XL) 25 MG 24 hr tablet Take 1 tablet (25 mg total) by mouth  daily. 90 tablet 3   No current facility-administered medications on file prior to visit.    Review of Systems:  As per HPI- otherwise negative.   Physical Examination: Vitals:   12/08/20 1303  BP: 110/62  Pulse: (!) 54  Resp: 16  Temp: 97.8 F (36.6 C)  SpO2: 97%   Vitals:   12/08/20 1303  Weight: 135 lb (61.2 kg)  Height: 5\' 2"  (1.575 m)   Body mass index is 24.69 kg/m. Ideal Body Weight: Weight in (lb) to have BMI = 25: 136.4  GEN: no acute distress.  Looks very well and younger than age.  Hard of hearing HEENT: Atraumatic, Normocephalic.   Bilateral TM wnl, oropharynx normal.  PEERL,EOMI.   Ears and Nose: No external deformity. CV: Rate controlled atrial fibrillation no M/G/R. No JVD. No thrill. No extra heart sounds. PULM: CTA B, no wheezes, crackles, rhonchi. No retractions. No resp. distress. No accessory muscle use. ABD: S, NT, ND. No rebound. No HSM. EXTR: No c/c/e PSYCH: Normally interactive. Conversant.  She has a healing wound on her left anterior shin.  No wound care necessary  Assessment and Plan: Atrial fibrillation, unspecified type ([place])  Essential hypertension, benign  Osteopenia, unspecified location - Plan: DG Bone Density  Wound of left lower extremity, initial encounter - Plan: Td vaccine greater than or equal to 7yo preservative free IM  Estrogen deficiency - Plan: DG Bone Density  Elderly woman here today for a follow-up visit.  She is in rate controlled atrial fib, properly anticoagulated Her blood pressure is well controlled She does have osteopenia, and is falling more frequently given her age.  We will obtain a bone density scan for her for follow-up, can consider treatment if now osteoporotic Updated tetanus vaccine I asked her to see me in about 6 months for follow-up, we can plan to do blood work at that time Encourage fourth dose of COVID-19 vaccine, shingles vaccine if not done already  This visit occurred during the SARS-CoV-2  public health emergency.  Safety protocols were in place, including screening questions prior to the visit, additional usage of staff PPE, and extensive cleaning of exam room while observing appropriate contact time as indicated for disinfecting solutions.    Signed Lamar Blinks, MD

## 2020-12-05 NOTE — Patient Instructions (Addendum)
It was great to see you again today, assuming all is well please see me in about 6 months  Please consider getting the shingles vaccine at your pharmacy if not done already We updated your tetanus vaccine today Please stop by the ground floor for a bone density screening today if you have time

## 2020-12-08 ENCOUNTER — Ambulatory Visit (INDEPENDENT_AMBULATORY_CARE_PROVIDER_SITE_OTHER): Payer: Medicare HMO | Admitting: Family Medicine

## 2020-12-08 ENCOUNTER — Other Ambulatory Visit: Payer: Self-pay

## 2020-12-08 ENCOUNTER — Encounter: Payer: Self-pay | Admitting: Family Medicine

## 2020-12-08 VITALS — BP 110/62 | HR 54 | Temp 97.8°F | Resp 16 | Ht 62.0 in | Wt 135.0 lb

## 2020-12-08 DIAGNOSIS — I4891 Unspecified atrial fibrillation: Secondary | ICD-10-CM

## 2020-12-08 DIAGNOSIS — E2839 Other primary ovarian failure: Secondary | ICD-10-CM

## 2020-12-08 DIAGNOSIS — Z23 Encounter for immunization: Secondary | ICD-10-CM

## 2020-12-08 DIAGNOSIS — I10 Essential (primary) hypertension: Secondary | ICD-10-CM | POA: Diagnosis not present

## 2020-12-08 DIAGNOSIS — S81802A Unspecified open wound, left lower leg, initial encounter: Secondary | ICD-10-CM

## 2020-12-08 DIAGNOSIS — M8580 Other specified disorders of bone density and structure, unspecified site: Secondary | ICD-10-CM

## 2021-02-22 ENCOUNTER — Other Ambulatory Visit: Payer: Self-pay | Admitting: *Deleted

## 2021-02-22 DIAGNOSIS — I4891 Unspecified atrial fibrillation: Secondary | ICD-10-CM

## 2021-02-22 MED ORDER — APIXABAN 5 MG PO TABS: 5.0000 mg | ORAL_TABLET | Freq: Two times a day (BID) | ORAL | 1 refills | Status: DC

## 2021-02-22 NOTE — Telephone Encounter (Signed)
Eliquis 5mg  refill request received. Patient is 87 years old, weight-61.2kg, Crea-0.69 on 05/12/2020, Diagnosis-Afib, and last seen by Dr. Marlou Porch on 11/11/2020. Dose is appropriate based on dosing criteria. Will send in refill to requested pharmacy.

## 2021-03-16 ENCOUNTER — Other Ambulatory Visit: Payer: Self-pay | Admitting: Cardiology

## 2021-03-16 DIAGNOSIS — I4891 Unspecified atrial fibrillation: Secondary | ICD-10-CM

## 2021-05-16 ENCOUNTER — Other Ambulatory Visit: Payer: Self-pay

## 2021-05-16 ENCOUNTER — Ambulatory Visit: Payer: Medicare HMO | Admitting: Cardiology

## 2021-05-16 ENCOUNTER — Encounter: Payer: Self-pay | Admitting: Cardiology

## 2021-05-16 VITALS — BP 110/64 | HR 80 | Ht 62.0 in | Wt 136.0 lb

## 2021-05-16 DIAGNOSIS — R001 Bradycardia, unspecified: Secondary | ICD-10-CM

## 2021-05-16 DIAGNOSIS — I509 Heart failure, unspecified: Secondary | ICD-10-CM | POA: Diagnosis not present

## 2021-05-16 DIAGNOSIS — I429 Cardiomyopathy, unspecified: Secondary | ICD-10-CM | POA: Diagnosis not present

## 2021-05-16 DIAGNOSIS — I10 Essential (primary) hypertension: Secondary | ICD-10-CM

## 2021-05-16 DIAGNOSIS — I4821 Permanent atrial fibrillation: Secondary | ICD-10-CM | POA: Diagnosis not present

## 2021-05-16 DIAGNOSIS — F0390 Unspecified dementia without behavioral disturbance: Secondary | ICD-10-CM | POA: Diagnosis not present

## 2021-05-16 DIAGNOSIS — Z7901 Long term (current) use of anticoagulants: Secondary | ICD-10-CM | POA: Diagnosis not present

## 2021-05-16 DIAGNOSIS — I110 Hypertensive heart disease with heart failure: Secondary | ICD-10-CM | POA: Diagnosis not present

## 2021-05-16 NOTE — Patient Instructions (Signed)
Medication Instructions:  The current medical regimen is effective;  continue present plan and medications.  *If you need a refill on your cardiac medications before your next appointment, please call your pharmacy*  Follow-Up: At [facility], you and your health needs are our priority.  As part of our continuing mission to provide you with exceptional heart care, we have created designated Provider Care Teams.  These Care Teams include your primary Cardiologist (physician) and Advanced Practice Providers (APPs -  Physician Assistants and Nurse Practitioners) who all work together to provide you with the care you need, when you need it.  We recommend signing up for the patient portal called "MyChart".  Sign up information is provided on this After Visit Summary.  MyChart is used to connect with patients for Virtual Visits (Telemedicine).  Patients are able to view lab/test results, encounter notes, upcoming appointments, etc.  Non-urgent messages can be sent to your provider as well.   To learn more about what you can do with MyChart, go to [email or web address].    Your next appointment:   1 year(s)  The format for your next appointment:   In Person  Provider:   Mark Skains, MD   Thank you for choosing [facility]!!    

## 2021-05-16 NOTE — Progress Notes (Signed)
Cardiology Office Note:    Date:  05/16/2021   ID:  Carol [name], DOB 1928/07/08, MRN [id number]  PCP:  Darreld Mclean, MD   [facility] Providers Cardiologist:  Candee Furbish, MD    Referring MD: Darreld Mclean, MD     History of Present Illness:    Carol [name] is a 87 y.o. female here for follow-up of paroxysmal atrial fibrillation with 50% prior stroke breast cancer.  [name] saw her many times in the past.  Overall doing quite well.  There are family members by marriage with Carol [name].  She is here today with her brother.  Enjoying her independent living at [facility].  Overall doing well no chest pain no fevers chills nausea vomiting.  Sometimes when she first gets up in the morning it takes her about 15 minutes before she is up and running she states.  A cup of coffee help she states.  Past Medical History:  Diagnosis Date   A-fib ([place])    Anemia    Arrhythmia    Blood transfusion without reported diagnosis    Breast cancer ([place])    Cancer ([place])    Cataract    CHF (congestive heart failure) ([facility])    Dyslipidemia    Hyperlipidemia    Hypertension    Paroxysmal supraventricular tachycardia ([facility])    Stroke [facility])     Past Surgical History:  Procedure Laterality Date   MASTECTOMY     TONSILLECTOMY     WRIST SURGERY      Current Medications: Current Meds  Medication Sig   apixaban (ELIQUIS) 5 MG TABS tablet Take 1 tablet (5 mg total) by mouth 2 (two) times daily.   losartan (COZAAR) 50 MG tablet TAKE 1 TABLET EVERY DAY   metoprolol succinate (TOPROL XL) 25 MG 24 hr tablet Take 1 tablet (25 mg total) by mouth daily.     Allergies:   Tramadol, Sertraline, and Zoloft [sertraline hcl]   Social History   Socioeconomic History   Marital status: Widowed    Spouse name: Not on file   Number of children: 2   Years of education: 2 years college   Highest education level: Not on file  Occupational History   Occupation: Retired  Tobacco  Use   Smoking status: Never   Smokeless tobacco: Never  Vaping Use   Vaping Use: Never used  Substance and Sexual Activity   Alcohol use: No   Drug use: No   Sexual activity: Not on file  Other Topics Concern   Not on file  Social History Narrative   Lives in a retirement community.   Right-handed.   Only drinks caffeine some days.   Social Determinants of Health   Financial Resource Strain: Not on file  Food Insecurity: Not on file  Transportation Needs: Not on file  Physical Activity: Not on file  Stress: Not on file  Social Connections: Not on file     Family History: The patient's family history includes Epilepsy in her mother; Heart disease in her maternal aunt; Stroke in her father.  ROS:   Please see the history of present illness.     All other systems reviewed and are negative.  EKGs/Labs/Other Studies Reviewed:     Recent Labs: No results found for requested labs within last 8760 hours.  Recent Lipid Panel    Component Value Date/Time   CHOL 203 (H) 08/19/2018 1350   CHOL 182 09/12/2016 0943   TRIG 100.0  08/19/2018 1350   HDL 69.10 08/19/2018 1350   HDL 81 09/12/2016 0943   CHOLHDL 3 08/19/2018 1350   VLDL 20.0 08/19/2018 1350   LDLCALC 114 (H) 08/19/2018 1350   LDLCALC 87 09/12/2016 0943     Risk Assessment/Calculations:          Physical Exam:    VS:  BP 110/64 (BP Location: Left Arm, Patient Position: Sitting, Cuff Size: Normal)   Pulse 80   Ht '5\' 2"'$  (1.575 m)   Wt 136 lb (61.7 kg)   LMP  (LMP Unknown)   SpO2 97%   BMI 24.87 kg/m     Wt Readings from Last 3 Encounters:  05/16/21 136 lb (61.7 kg)  12/08/20 135 lb (61.2 kg)  11/11/20 134 lb (60.8 kg)     GEN:  Well nourished, well developed in no acute distress HEENT: Normal NECK: No JVD; No carotid bruits LYMPHATICS: No lymphadenopathy CARDIAC: RRR, no murmurs, rubs, gallops RESPIRATORY:  Clear to auscultation without rales, wheezing or rhonchi  ABDOMEN: Soft, non-tender,  non-distended MUSCULOSKELETAL:  No edema; No deformity  SKIN: Warm and dry NEUROLOGIC:  Alert and oriented x 3 PSYCHIATRIC:  Normal affect   ASSESSMENT:    1. Permanent atrial fibrillation ([place])   2. Chronic anticoagulation   3. Bradycardia   4. Essential hypertension    PLAN:    In order of problems listed above:   Persistent atrial fibrillation -Continuing with atrial fibrillation but asymptomatic.  Doing well with low-dose metoprolol succinate 25 mg a day.   Chronic anticoagulations -Continuing with Eliquis.  No bleeding.  Stable.  Prior hemoglobin 12.3 creatinine 0.69.   Cardiomyopathy with mild LV systolic dysfunction -NYHA class I.  Doing well.  On low-dose losartan 50 mg, no changes made today.   Essential hypertension -Blood pressure is fine Toprol and losartan currently being utilized.   Dementia -Oversight for her medicines at [facility] independent living.   Prior bradycardia -Toprol was cut back.        Medication Adjustments/Labs and Tests Ordered: Current medicines are reviewed at length with the patient today.  Concerns regarding medicines are outlined above.  No orders of the defined types were placed in this encounter.  No orders of the defined types were placed in this encounter.   Patient Instructions  Medication Instructions:  The current medical regimen is effective;  continue present plan and medications.  *If you need a refill on your cardiac medications before your next appointment, please call your pharmacy*  Follow-Up: At [facility], you and your health needs are our priority.  As part of our continuing mission to provide you with exceptional heart care, we have created designated Provider Care Teams.  These Care Teams include your primary Cardiologist (physician) and Advanced Practice Providers (APPs -  Physician Assistants and Nurse Practitioners) who all work together to provide you with the care you need, when you need it.  We  recommend signing up for the patient portal called "MyChart".  Sign up information is provided on this After Visit Summary.  MyChart is used to connect with patients for Virtual Visits (Telemedicine).  Patients are able to view lab/test results, encounter notes, upcoming appointments, etc.  Non-urgent messages can be sent to your provider as well.   To learn more about what you can do with MyChart, go to [email or web address].    Your next appointment:   1 year(s)  The format for your next appointment:   In Person  Provider:   Elta Guadeloupe  Marlou Porch, MD   Thank you for choosing [facility]!!     Signed, Candee Furbish, MD  05/16/2021 4:03 PM    [facility]

## 2021-06-07 NOTE — Progress Notes (Addendum)
[place] at [facility] [place], [place], [place] [address] [phone]  Date:  06/13/2021   Name:  Carol [name]   DOB:  02-Aug-1928   MRN:  [id number]  PCP:  Darreld Mclean, MD    Chief Complaint: 6 month follow up (Concerns/ questions: none/Flu shot today: yes)   History of Present Illness:  Carol [name] is a 87 y.o. very pleasant female patient who presents with the following:  Pt seen today for 6 month follow-up visit  Last seen by myself in April- history of a fib on eliquis, hyperlipidemia, HTN, mild dementia, breast cancer, prior stroke   Her sister-in-law, [name] is one of her main support people Her brother [name] is also very involved - he is with her today   Seen by cardiology- Marlou Porch- last month: Persistent atrial fibrillation -Continuing with atrial fibrillation but asymptomatic.  Doing well with low-dose metoprolol succinate 25 mg a day. Chronic anticoagulations -Continuing with Eliquis.  No bleeding.  Stable.  Prior hemoglobin 12.3 creatinine 0.69. Cardiomyopathy with mild LV systolic dysfunction -NYHA class I.  Doing well.  On low-dose losartan 50 mg, no changes made today. Essential hypertension -Blood pressure is fine Toprol and losartan currently being utilized.  She lives at an assisted living facility - the [employer] booster- she has not done the latest one yet, recommended  Flu vaccine- given today  Most recent labs about a year ago - she is not fasting today for labs  She is s/p B mastectomy  Order bone density  [name] has no complaints, she is feeling well and is in good spirits   Patient Active Problem List   Diagnosis Date Noted   Paroxysmal atrial fibrillation ([place]) 08/13/2018   Right knee pain 11/16/2017   Osteopenia 11/12/2016   Mild cognitive disorder 10/10/2016   Chronic anticoagulation 10/04/2016   History of atrial fibrillation 10/04/2016   Essential  hypertension, benign 09/01/2016   Paroxysmal supraventricular tachycardia ([place]) 09/01/2016   Hyperlipidemia 09/01/2016    Past Medical History:  Diagnosis Date   A-fib ([facility])    Anemia    Arrhythmia    Blood transfusion without reported diagnosis    Breast cancer ([place])    Cancer ([place])    Cataract    CHF (congestive heart failure) ([facility])    Dyslipidemia    Hyperlipidemia    Hypertension    Paroxysmal supraventricular tachycardia ([place])    Stroke [facility])     Past Surgical History:  Procedure Laterality Date   MASTECTOMY     TONSILLECTOMY     WRIST SURGERY      Social History   Tobacco Use   Smoking status: Never   Smokeless tobacco: Never  Vaping Use   Vaping Use: Never used  Substance Use Topics   Alcohol use: No   Drug use: No    Family History  Problem Relation Age of Onset   Epilepsy Mother    Stroke Father    Heart disease Maternal Aunt     Allergies  Allergen Reactions   Tramadol Other (See Comments)    seizure   Sertraline Other (See Comments)   Zoloft [Sertraline Hcl]     headache    Medication list has been reviewed and updated.  Current Outpatient Medications on File Prior to Visit  Medication Sig Dispense Refill   apixaban (ELIQUIS) 5 MG TABS tablet Take 1 tablet (5 mg total) by mouth 2 (  two) times daily. 180 tablet 1   losartan (COZAAR) 50 MG tablet TAKE 1 TABLET EVERY DAY 90 tablet 3   metoprolol succinate (TOPROL XL) 25 MG 24 hr tablet Take 1 tablet (25 mg total) by mouth daily. 90 tablet 3   No current facility-administered medications on file prior to visit.    Review of Systems:  As per HPI- otherwise negative.   Physical Examination: Vitals:   06/13/21 1323  BP: 120/60  Pulse: 60  Resp: 18  Temp: 97.7 F (36.5 C)  SpO2: 96%   Vitals:   06/13/21 1323  Weight: 137 lb 6.4 oz (62.3 kg)  Height: 5\' 2"  (1.575 m)   Body mass index is 25.13 kg/m. Ideal Body Weight: Weight in (lb) to have BMI = 25: 136.4  GEN: no acute  distress.  Normal weight, looks well and her normal self  HEENT: Atraumatic, Normocephalic.  Ears and Nose: No external deformity. CV: rate controlled a fib , No M/G/R. No JVD. No thrill. No extra heart sounds. PULM: CTA B, no wheezes, crackles, rhonchi. No retractions. No resp. distress. No accessory muscle use. ABD: S, NT, ND, +BS. No rebound. No HSM. EXTR: No c/c/e PSYCH: Normally interactive. Conversant.  S/p B mastectomy    Assessment and Plan: Atrial fibrillation, unspecified type ([place]) - Plan: CBC, Comprehensive metabolic panel, TSH  Essential hypertension, benign - Plan: CBC, Comprehensive metabolic panel  Need for influenza vaccination - Plan: Flu Vaccine QUAD High Dose([place])  Fatigue, unspecified type - Plan: VITAMIN D 25 Hydroxy (Vit-D Deficiency, Fractures)  Estrogen deficiency - Plan: DG Bone Density  Following up today- doing well  Flu shot She plans covid booster Ordered dexa A fib treated appropriately  Will plan further follow- up pending labs. Recheck in 6 months    Signed Lamar Blinks, MD  Addendum 10/18, received labs as below.  Message to patient  Results for orders placed or performed in visit on 06/13/21  CBC  Result Value Ref Range   WBC 6.4 4.0 - 10.5 K/uL   RBC 4.40 3.87 - 5.11 Mil/uL   Platelets 226.0 150.0 - 400.0 K/uL   Hemoglobin 13.3 12.0 - 15.0 g/dL   HCT 40.5 36.0 - 46.0 %   MCV 91.9 78.0 - 100.0 fl   MCHC 32.8 30.0 - 36.0 g/dL   RDW 14.9 11.5 - 15.5 %  Comprehensive metabolic panel  Result Value Ref Range   Sodium 138 135 - 145 mEq/L   Potassium 4.2 3.5 - 5.1 mEq/L   Chloride 101 96 - 112 mEq/L   CO2 30 19 - 32 mEq/L   Glucose, Bld 82 70 - 99 mg/dL   BUN 14 6 - 23 mg/dL   Creatinine, Ser 0.88 0.40 - 1.20 mg/dL   Total Bilirubin 0.6 0.2 - 1.2 mg/dL   Alkaline Phosphatase 45 39 - 117 U/L   AST 22 0 - 37 U/L   ALT 13 0 - 35 U/L   Total Protein 7.2 6.0 - 8.3 g/dL   Albumin 4.3 3.5 - 5.2 g/dL   GFR 57.63 (L) >60.00 mL/min    Calcium 9.7 8.4 - 10.5 mg/dL  TSH  Result Value Ref Range   TSH 1.40 0.35 - 5.50 uIU/mL  VITAMIN D 25 Hydroxy (Vit-D Deficiency, Fractures)  Result Value Ref Range   VITD 11.62 (L) 30.00 - 100.00 ng/mL

## 2021-06-13 ENCOUNTER — Ambulatory Visit (INDEPENDENT_AMBULATORY_CARE_PROVIDER_SITE_OTHER): Payer: Medicare HMO | Admitting: Family Medicine

## 2021-06-13 ENCOUNTER — Other Ambulatory Visit: Payer: Self-pay

## 2021-06-13 VITALS — BP 120/60 | HR 60 | Temp 97.7°F | Resp 18 | Ht 62.0 in | Wt 137.4 lb

## 2021-06-13 DIAGNOSIS — I4891 Unspecified atrial fibrillation: Secondary | ICD-10-CM

## 2021-06-13 DIAGNOSIS — Z23 Encounter for immunization: Secondary | ICD-10-CM | POA: Diagnosis not present

## 2021-06-13 DIAGNOSIS — E2839 Other primary ovarian failure: Secondary | ICD-10-CM | POA: Diagnosis not present

## 2021-06-13 DIAGNOSIS — R5383 Other fatigue: Secondary | ICD-10-CM

## 2021-06-13 DIAGNOSIS — E559 Vitamin D deficiency, unspecified: Secondary | ICD-10-CM

## 2021-06-13 DIAGNOSIS — I10 Essential (primary) hypertension: Secondary | ICD-10-CM

## 2021-06-13 DIAGNOSIS — I110 Hypertensive heart disease with heart failure: Secondary | ICD-10-CM | POA: Diagnosis not present

## 2021-06-13 DIAGNOSIS — I509 Heart failure, unspecified: Secondary | ICD-10-CM | POA: Diagnosis not present

## 2021-06-13 NOTE — Patient Instructions (Signed)
Great to see you again today! I will be in touch with your labs Flu shot given today You can get your covid booster at the pharmacy on the ground floor today if you like I also ordered a bone density scan for you- this can be done at the imaging dept also on the ground floor

## 2021-06-14 ENCOUNTER — Encounter: Payer: Self-pay | Admitting: Family Medicine

## 2021-06-14 LAB — COMPREHENSIVE METABOLIC PANEL
ALT: 13 U/L (ref 0–35)
AST: 22 U/L (ref 0–37)
Albumin: 4.3 g/dL (ref 3.5–5.2)
Alkaline Phosphatase: 45 U/L (ref 39–117)
BUN: 14 mg/dL (ref 6–23)
CO2: 30 mEq/L (ref 19–32)
Calcium: 9.7 mg/dL (ref 8.4–10.5)
Chloride: 101 mEq/L (ref 96–112)
Creatinine, Ser: 0.88 mg/dL (ref 0.40–1.20)
GFR: 57.63 mL/min — ABNORMAL LOW (ref >60.00)
Glucose, Bld: 82 mg/dL (ref 70–99)
Potassium: 4.2 mEq/L (ref 3.5–5.1)
Sodium: 138 mEq/L (ref 135–145)
Total Bilirubin: 0.6 mg/dL (ref 0.2–1.2)
Total Protein: 7.2 g/dL (ref 6.0–8.3)

## 2021-06-14 LAB — CBC
HCT: 40.5 % (ref 36.0–46.0)
Hemoglobin: 13.3 g/dL (ref 12.0–15.0)
MCHC: 32.8 g/dL (ref 30.0–36.0)
MCV: 91.9 fl (ref 78.0–100.0)
Platelets: 226 10*3/uL (ref 150.0–400.0)
RBC: 4.4 Mil/uL (ref 3.87–5.11)
RDW: 14.9 % (ref 11.5–15.5)
WBC: 6.4 10*3/uL (ref 4.0–10.5)

## 2021-06-14 LAB — VITAMIN D 25 HYDROXY (VIT D DEFICIENCY, FRACTURES): VITD: 11.62 ng/mL — ABNORMAL LOW (ref 30.00–100.00)

## 2021-06-14 LAB — TSH: TSH: 1.4 u[IU]/mL (ref 0.35–5.50)

## 2021-06-14 MED ORDER — VITAMIN D3 1.25 MG (50000 UT) PO CAPS: ORAL_CAPSULE | ORAL | 0 refills | Status: DC

## 2021-06-14 NOTE — Addendum Note (Signed)
Addended by: Lamar Blinks C on: 06/14/2021 07:18 PM   Modules accepted: Orders

## 2021-06-20 ENCOUNTER — Other Ambulatory Visit (HOSPITAL_BASED_OUTPATIENT_CLINIC_OR_DEPARTMENT_OTHER): Payer: Medicare HMO

## 2021-06-23 ENCOUNTER — Other Ambulatory Visit: Payer: Self-pay

## 2021-06-23 MED ORDER — METOPROLOL SUCCINATE ER 25 MG PO TB24
25.0000 mg | ORAL_TABLET | Freq: Every day | ORAL | 3 refills | Status: DC
Start: 2021-06-23 — End: 2022-05-15

## 2021-06-24 ENCOUNTER — Encounter: Payer: Self-pay | Admitting: Family Medicine

## 2021-09-03 ENCOUNTER — Other Ambulatory Visit: Payer: Self-pay | Admitting: Cardiology

## 2021-09-03 DIAGNOSIS — I4891 Unspecified atrial fibrillation: Secondary | ICD-10-CM

## 2021-09-05 NOTE — Telephone Encounter (Signed)
Pt last saw Dr Marlou Porch 05/16/21, last labs 06/13/21 Creat 0.88, age 88, weight 62.3kg, based on specified criteria pt is on appropriate dosage of Eliquis 5mg  BID for afib.  Will refill rx.

## 2021-09-07 ENCOUNTER — Telehealth: Payer: Self-pay | Admitting: Family Medicine

## 2021-09-07 NOTE — Telephone Encounter (Signed)
Patient's sister would like to know if the patient needs to get a refill for the vitamin D medication or if she can stop it. Please advice.    Cholecalciferol (VITAMIN D3) 1.25 MG

## 2021-09-07 NOTE — Telephone Encounter (Signed)
SIL called back and pt aware of instructions.

## 2021-09-07 NOTE — Telephone Encounter (Signed)
Pt is now okay to take over-the-counter daily vitamin D-2000 international units daily. Tried calling SIL [name] multiple times and was unable to reach her. No one answered Preferred number listed on chart. Will continue to try to reach someone.

## 2021-10-14 ENCOUNTER — Ambulatory Visit: Payer: Medicare HMO

## 2021-10-14 NOTE — Progress Notes (Unsigned)
Subjective:   Carol [name] is a 88 y.o. female who presents for an Initial Medicare Annual Wellness Visit.    Review of Systems    ***       Objective:    There were no vitals filed for this visit. There is no height or weight on file to calculate BMI.  Advanced Directives 09/05/2019 10/03/2016  Does Patient Have a Medical Advance Directive? No Yes  Does patient want to make changes to medical advance directive? - No - Patient declined  Would patient like information on creating a medical advance directive? No - Patient declined -    Current Medications (verified) Outpatient Encounter Medications as of 10/14/2021  Medication Sig   apixaban (ELIQUIS) 5 MG TABS tablet TAKE 1 TABLET TWICE DAILY   Cholecalciferol (VITAMIN D3) 1.25 MG (50000 UT) CAPS Take 1 weekly for 12 weeks   losartan (COZAAR) 50 MG tablet TAKE 1 TABLET EVERY DAY   metoprolol succinate (TOPROL XL) 25 MG 24 hr tablet Take 1 tablet (25 mg total) by mouth daily.   No facility-administered encounter medications on file as of 10/14/2021.    Allergies (verified) Tramadol, Sertraline, and Zoloft [sertraline hcl]   History: Past Medical History:  Diagnosis Date   A-fib ([place])    Anemia    Arrhythmia    Blood transfusion without reported diagnosis    Breast cancer ([place])    Cancer ([place])    Cataract    CHF (congestive heart failure) (HCC)    Dyslipidemia    Hyperlipidemia    Hypertension    Paroxysmal supraventricular tachycardia (HCC)    Stroke [facility])    Past Surgical History:  Procedure Laterality Date   MASTECTOMY     TONSILLECTOMY     WRIST SURGERY     Family History  Problem Relation Age of Onset   Epilepsy Mother    Stroke Father    Heart disease Maternal Aunt    Social History   Socioeconomic History   Marital status: Widowed    Spouse name: Not on file   Number of children: 2   Years of education: 2 years college   Highest education level: Not on file  Occupational History    Occupation: Retired  Tobacco Use   Smoking status: Never   Smokeless tobacco: Never  Vaping Use   Vaping Use: Never used  Substance and Sexual Activity   Alcohol use: No   Drug use: No   Sexual activity: Not on file  Other Topics Concern   Not on file  Social History Narrative   Lives in a retirement community.   Right-handed.   Only drinks caffeine some days.   Social Determinants of Health   Financial Resource Strain: Not on file  Food Insecurity: Not on file  Transportation Needs: Not on file  Physical Activity: Not on file  Stress: Not on file  Social Connections: Not on file    Tobacco Counseling Counseling given: Not Answered   Clinical Intake:                 Diabetic?No         Activities of Daily Living In your present state of health, do you have any difficulty performing the following activities: 06/13/2021  Hearing? N  Vision? N  Difficulty concentrating or making decisions? N  Walking or climbing stairs? N  Dressing or bathing? N  Doing errands, shopping? N  Some recent data might be hidden    Patient  Care Team: Copland, Gay Filler, MD as PCP - General (Family Medicine) Jerline Pain, MD as PCP - Cardiology (Cardiology)  Indicate any recent Medical Services you may have received from other than [facility] providers in the past year (date may be approximate).     Assessment:   This is a routine wellness examination for [name].  Hearing/Vision screen No results found.  Dietary issues and exercise activities discussed:     Goals Addressed   None    Depression Screen PHQ 2/9 Scores 12/08/2020 09/18/2019 11/07/2017  PHQ - 2 Score 0 0 0    Fall Risk Fall Risk  12/08/2020 09/18/2019 11/07/2017  Falls in the past year? 1 1 Yes  Number falls in past yr: 0 0 1  Injury with Fall? 0 0 Yes  Risk for fall due to : - - Impaired mobility  Follow up - Falls evaluation completed -    FALL RISK PREVENTION PERTAINING TO THE HOME:  Any  stairs in or around the home? {YES/NO:21197} If so, are there any without handrails? {YES/NO:21197} Home free of loose throw rugs in walkways, pet beds, electrical cords, etc? {YES/NO:21197} Adequate lighting in your home to reduce risk of falls? {YES/NO:21197}  ASSISTIVE DEVICES UTILIZED TO PREVENT FALLS:  Life alert? {YES/NO:21197} Use of a cane, walker or w/c? {YES/NO:21197} Grab bars in the bathroom? {YES/NO:21197} Shower chair or bench in shower? {YES/NO:21197} Elevated toilet seat or a handicapped toilet? {YES/NO:21197}  TIMED UP AND GO:  Was the test performed? {YES/NO:21197}.  Length of time to ambulate 10 feet: *** sec.   {Appearance of Gait:2101803}  Cognitive Function: MMSE - Mini Mental State Exam 09/11/2018  Orientation to time 4  Orientation to Place 5  Registration 3  Attention/ Calculation 5  Recall 2  Language- name 2 objects 2  Language- repeat 1  Language- follow 3 step command 3  Language- read & follow direction 1  Write a sentence 1  Copy design 1  Total score 28        Immunizations Immunization History  Administered Date(s) Administered   Fluad Quad(high Dose 65+) 06/10/2020, 06/13/2021   Influenza Split 08/08/2018, 05/13/2019   Influenza, High Dose Seasonal PF 11/07/2017, 08/08/2018, 05/13/2019, 06/10/2020   Pneumococcal Conjugate-13 11/02/2016   Td 12/08/2020    TDAP status: Up to date  Flu Vaccine status: Up to date  {Pneumococcal vaccine status:2101807}  {Covid-19 vaccine status:2101808}  Qualifies for Shingles Vaccine? Yes   Zostavax completed No   Shingrix Completed?: No.    Education has been provided regarding the importance of this vaccine. Patient has been advised to call insurance company to determine out of pocket expense if they have not yet received this vaccine. Advised may also receive vaccine at local pharmacy or Health Dept. Verbalized acceptance and understanding.  Screening Tests Health Maintenance  Topic Date  Due   COVID-19 Vaccine (1) Never done   Zoster Vaccines- Shingrix (1 of 2) Never done   Pneumonia Vaccine 33+ Years old (2 - PPSV23 if available, else PCV20) 11/02/2017   TETANUS/TDAP  12/09/2030   INFLUENZA VACCINE  Completed   DEXA SCAN  Completed   HPV VACCINES  Aged Out    Health Maintenance  Health Maintenance Due  Topic Date Due   COVID-19 Vaccine (1) Never done   Zoster Vaccines- Shingrix (1 of 2) Never done   Pneumonia Vaccine 62+ Years old (2 - PPSV23 if available, else PCV20) 11/02/2017    Colorectal cancer screening: No longer required.  Mammogram status: No longer required due to bilateral mastectomy.  {Bone Density status:21018021}  Lung Cancer Screening: (Low Dose CT Chest recommended if Age 69-80 years, 30 pack-year currently smoking OR have quit w/in 15years.) does not qualify.     Additional Screening:  Hepatitis C Screening: does not qualify  Vision Screening: Recommended annual ophthalmology exams for early detection of glaucoma and other disorders of the eye. Is the patient up to date with their annual eye exam?  {YES/NO:21197} Who is the provider or what is the name of the office in which the patient attends annual eye exams? *** If pt is not established with a provider, would they like to be referred to a provider to establish care? {YES/NO:21197}.   Dental Screening: Recommended annual dental exams for proper oral hygiene  Community Resource Referral / Chronic Care Management: CRR required this visit?  {YES/NO:21197}  CCM required this visit?  {YES/NO:21197}     Plan:     I have personally reviewed and noted the following in the patients chart:   Medical and social history Use of alcohol, tobacco or illicit drugs  Current medications and supplements including opioid prescriptions. {Opioid Prescriptions:7851623456} Functional ability and status Nutritional status Physical activity Advanced directives List of other  physicians Hospitalizations, surgeries, and ER visits in previous 12 months Vitals Screenings to include cognitive, depression, and falls Referrals and appointments  In addition, I have reviewed and discussed with patient certain preventive protocols, quality metrics, and best practice recommendations. A written personalized care plan for preventive services as well as general preventive health recommendations were provided to patient.     Marta Antu, LPN   09/27/4386  Nurse Health Advisor  Nurse Notes: ***

## 2021-11-30 ENCOUNTER — Telehealth: Payer: Self-pay | Admitting: Family Medicine

## 2021-11-30 NOTE — Telephone Encounter (Signed)
Left message for patient to call back and schedule Medicare Annual Wellness Visit (AWV) either virtually or phone ? ? ? AWVI DUE 08/28/2009 PER [place] ? ?please schedule at anytime with health coach ? ?This should be a 45 minute visit.  ? ?I left my direct number [phone] ?

## 2021-12-08 NOTE — Progress Notes (Addendum)
Therapist, music at [facility] ?[address], [address] ?[place], [place] [address] ?336 [phone] ?Fax [phone] ? ?Date:  12/12/2021  ? ?Name:  Carol [name]   DOB:  29-Dec-1927   MRN:  [id number] ? ?PCP:  Darreld Mclean, MD  ? ? ?Chief Complaint: 6 month follow up (Concerns/ questions: none/) ? ? ?History of Present Illness: ? ?Carol [name] is a 88 y.o. very pleasant female patient who presents with the following: ? ?Pt seen today for periodic follow-up-  history of a fib on eliquis, hyperlipidemia, HTN, mild dementia, breast cancer, prior stroke ?  ?Her sister-in-law, [name] is one of her main support people ?Her brother [name] is also very involved- he is with her today  ?She lives at an assisted living facility - the [facility].  She enjoys it there, has no real concerns today  ?Last seen by myself in October  ?She sees cardiology once a year  ? ?Recommend Shingrix series ?Covid booster- recommend  ?Pneumonia booster- give today  ?S/p bilateral mastectomy several years ago  ? ?She has not had any falls ?Her appetite is good  ?She does have hearing aids but often does not use them ?She often forgets to wear them -does not have them in today ? ?She exercises by walking around her AL facility ?Patient Active Problem List  ? Diagnosis Date Noted  ? Paroxysmal atrial fibrillation ([place]) 08/13/2018  ? Right knee pain 11/16/2017  ? Osteopenia 11/12/2016  ? Mild cognitive disorder 10/10/2016  ? Chronic anticoagulation 10/04/2016  ? History of atrial fibrillation 10/04/2016  ? Essential hypertension, benign 09/01/2016  ? Paroxysmal supraventricular tachycardia ([place]) 09/01/2016  ? Hyperlipidemia 09/01/2016  ? ? ?Past Medical History:  ?Diagnosis Date  ? A-fib ([place])   ? Anemia   ? Arrhythmia   ? Blood transfusion without reported diagnosis   ? Breast cancer ([place])   ? Cancer [facility])   ? Cataract   ? CHF (congestive heart failure) ([place])   ? Dyslipidemia   ? Hyperlipidemia   ? Hypertension   ? Paroxysmal  supraventricular tachycardia ([place])   ? Stroke [facility])   ? ? ?Past Surgical History:  ?Procedure Laterality Date  ? MASTECTOMY    ? TONSILLECTOMY    ? WRIST SURGERY    ? ? ?Social History  ? ?Tobacco Use  ? Smoking status: Never  ? Smokeless tobacco: Never  ?Vaping Use  ? Vaping Use: Never used  ?Substance Use Topics  ? Alcohol use: No  ? Drug use: No  ? ? ?Family History  ?Problem Relation Age of Onset  ? Epilepsy Mother   ? Stroke Father   ? Heart disease Maternal Aunt   ? ? ?Allergies  ?Allergen Reactions  ? Tramadol Other (See Comments)  ?  seizure  ? Sertraline Other (See Comments)  ? Zoloft [Sertraline Hcl]   ?  headache  ? ? ?Medication list has been reviewed and updated. ? ?Current Outpatient Medications on File Prior to Visit  ?Medication Sig Dispense Refill  ? apixaban (ELIQUIS) 5 MG TABS tablet TAKE 1 TABLET TWICE DAILY 180 tablet 2  ? Cholecalciferol (VITAMIN D3) 1.25 MG (50000 UT) CAPS Take 1 weekly for 12 weeks 12 capsule 0  ? losartan (COZAAR) 50 MG tablet TAKE 1 TABLET EVERY DAY 90 tablet 3  ? metoprolol succinate (TOPROL XL) 25 MG 24 hr tablet Take 1 tablet (25 mg total) by mouth daily. 90 tablet 3  ? ?No current  facility-administered medications on file prior to visit.  ? ? ?Review of Systems: ? ?As per HPI- otherwise negative. ? ? ?Physical Examination: ?Vitals:  ? 12/12/21 1258  ?BP: 120/78  ?Pulse: 80  ?Resp: 18  ?Temp: 97.8 ?F (36.6 ?C)  ?SpO2: 98%  ? ?Vitals:  ? 12/12/21 1258  ?Weight: 140 lb (63.5 kg)  ?Height: '5\' 2"'$  (1.575 m)  ? ?Body mass index is 25.61 kg/m?. ?Ideal Body Weight: Weight in (lb) to have BMI = 25: 136.4 ? ?GEN: no acute distress.  Normal weight, looks well-neatly groomed and dressed ?Somewhat hard of hearing ?HEENT: Atraumatic, Normocephalic.  Bilateral TM wnl, oropharynx normal.  PEERL,EOMI.   ?Ears and Nose: No external deformity. ?CV: Rate controlled atrial fibrillation, No M/G/R. No JVD. No thrill. No extra heart sounds. ?PULM: CTA B, no wheezes, crackles, rhonchi. No  retractions. No resp. distress. No accessory muscle use. ?ABD: S, NT, ND. No rebound. No HSM. ?EXTR: No c/c/e ?PSYCH: Normally interactive. Conversant.  ?Status post bilateral mastectomy ? ?Assessment and Plan: ?Immunization due - Plan: Pneumococcal polysaccharide vaccine 23-valent greater than or equal to 2yo subcutaneous/IM ? ?Vitamin D deficiency - Plan: VITAMIN D 25 Hydroxy (Vit-D Deficiency, Fractures) ? ?Essential hypertension, benign - Plan: Basic metabolic panel ? ?Atrial fibrillation, unspecified type ([place]) ? ?Charming 88 year old lady, doing great today.  She has no real concerns ?Atrial fibs rate controlled, on appropriate anticoagulation ?Follow-up on vitamin D deficiency, gave Pneumovax booster and recommended COVID booster, Shingrix ?Recheck 6 months assuming all is well ?Signed ?Lamar Blinks, MD ? ?Received labs as below, message to patient ? ?Results for orders placed or performed in visit on 12/12/21  ?Basic metabolic panel  ?Result Value Ref Range  ? Sodium 137 135 - 145 mEq/L  ? Potassium 4.5 3.5 - 5.1 mEq/L  ? Chloride 101 96 - 112 mEq/L  ? CO2 27 19 - 32 mEq/L  ? Glucose, Bld 90 70 - 99 mg/dL  ? BUN 17 6 - 23 mg/dL  ? Creatinine, Ser 0.95 0.40 - 1.20 mg/dL  ? GFR 52.39 (L) >60.00 mL/min  ? Calcium 9.6 8.4 - 10.5 mg/dL  ?VITAMIN D 25 Hydroxy (Vit-D Deficiency, Fractures)  ?Result Value Ref Range  ? VITD 24.97 (L) 30.00 - 100.00 ng/mL  ? ? ? ?

## 2021-12-12 ENCOUNTER — Ambulatory Visit (INDEPENDENT_AMBULATORY_CARE_PROVIDER_SITE_OTHER): Payer: Medicare HMO | Admitting: Family Medicine

## 2021-12-12 ENCOUNTER — Encounter: Payer: Self-pay | Admitting: Family Medicine

## 2021-12-12 VITALS — BP 120/78 | HR 80 | Temp 97.8°F | Resp 18 | Ht 62.0 in | Wt 140.0 lb

## 2021-12-12 DIAGNOSIS — I509 Heart failure, unspecified: Secondary | ICD-10-CM | POA: Diagnosis not present

## 2021-12-12 DIAGNOSIS — F03A0 Unspecified dementia, mild, without behavioral disturbance, psychotic disturbance, mood disturbance, and anxiety: Secondary | ICD-10-CM | POA: Diagnosis not present

## 2021-12-12 DIAGNOSIS — Z23 Encounter for immunization: Secondary | ICD-10-CM

## 2021-12-12 DIAGNOSIS — E559 Vitamin D deficiency, unspecified: Secondary | ICD-10-CM | POA: Diagnosis not present

## 2021-12-12 DIAGNOSIS — I10 Essential (primary) hypertension: Secondary | ICD-10-CM | POA: Diagnosis not present

## 2021-12-12 DIAGNOSIS — I110 Hypertensive heart disease with heart failure: Secondary | ICD-10-CM | POA: Diagnosis not present

## 2021-12-12 DIAGNOSIS — I4891 Unspecified atrial fibrillation: Secondary | ICD-10-CM

## 2021-12-12 LAB — BASIC METABOLIC PANEL
BUN: 17 mg/dL (ref 6–23)
CO2: 27 mEq/L (ref 19–32)
Calcium: 9.6 mg/dL (ref 8.4–10.5)
Chloride: 101 mEq/L (ref 96–112)
Creatinine, Ser: 0.95 mg/dL (ref 0.40–1.20)
GFR: 52.39 mL/min — ABNORMAL LOW (ref >60.00)
Glucose, Bld: 90 mg/dL (ref 70–99)
Potassium: 4.5 mEq/L (ref 3.5–5.1)
Sodium: 137 mEq/L (ref 135–145)

## 2021-12-12 LAB — VITAMIN D 25 HYDROXY (VIT D DEFICIENCY, FRACTURES): VITD: 24.97 ng/mL — ABNORMAL LOW (ref 30.00–100.00)

## 2021-12-12 NOTE — Patient Instructions (Addendum)
It was great to see you again today!  I will be in touch with your labs ? ?You got a pneumonia booster today ?Please consider getting the shingles vaccine (shingrix) at your pharmacy and an update covid (bivalent) booster if not done already ?Assuming all is well please see me in about 6 months  ?

## 2022-01-13 ENCOUNTER — Telehealth: Payer: Self-pay | Admitting: Family Medicine

## 2022-01-13 NOTE — Telephone Encounter (Signed)
Patient sister called letting us know she was having trouble on mychart.(Last log in was jan 2023) I changed pass word hoping that would help on their end.   Patient sister would like a call from someone about the LAST set of labs Mrs [place] had.Marland Kitchen

## 2022-01-13 NOTE — Telephone Encounter (Signed)
Carol [name] was made aware of results.

## 2022-05-02 ENCOUNTER — Other Ambulatory Visit: Payer: Self-pay | Admitting: Cardiology

## 2022-05-02 DIAGNOSIS — I4891 Unspecified atrial fibrillation: Secondary | ICD-10-CM

## 2022-05-14 ENCOUNTER — Other Ambulatory Visit: Payer: Self-pay | Admitting: Cardiology

## 2022-06-08 ENCOUNTER — Encounter: Payer: Self-pay | Admitting: Physician Assistant

## 2022-06-08 ENCOUNTER — Ambulatory Visit: Payer: Medicare HMO | Attending: Physician Assistant | Admitting: Physician Assistant

## 2022-06-08 VITALS — BP 122/68 | HR 70 | Ht 62.0 in | Wt 137.0 lb

## 2022-06-08 DIAGNOSIS — I4891 Unspecified atrial fibrillation: Secondary | ICD-10-CM

## 2022-06-08 DIAGNOSIS — I429 Cardiomyopathy, unspecified: Secondary | ICD-10-CM

## 2022-06-08 DIAGNOSIS — I4821 Permanent atrial fibrillation: Secondary | ICD-10-CM

## 2022-06-08 DIAGNOSIS — E785 Hyperlipidemia, unspecified: Secondary | ICD-10-CM

## 2022-06-08 DIAGNOSIS — I10 Essential (primary) hypertension: Secondary | ICD-10-CM

## 2022-06-08 DIAGNOSIS — I340 Nonrheumatic mitral (valve) insufficiency: Secondary | ICD-10-CM

## 2022-06-08 DIAGNOSIS — I371 Nonrheumatic pulmonary valve insufficiency: Secondary | ICD-10-CM | POA: Diagnosis not present

## 2022-06-08 DIAGNOSIS — I071 Rheumatic tricuspid insufficiency: Secondary | ICD-10-CM

## 2022-06-08 MED ORDER — LOSARTAN POTASSIUM 50 MG PO TABS: 50.0000 mg | ORAL_TABLET | Freq: Every day | ORAL | 3 refills | Status: DC

## 2022-06-08 MED ORDER — APIXABAN 5 MG PO TABS: 5.0000 mg | ORAL_TABLET | Freq: Two times a day (BID) | ORAL | 3 refills | Status: DC

## 2022-06-08 MED ORDER — METOPROLOL SUCCINATE ER 25 MG PO TB24: 25.0000 mg | ORAL_TABLET | Freq: Every day | ORAL | 3 refills | Status: DC

## 2022-06-08 NOTE — Progress Notes (Signed)
Cardiology Office Note    Date:  06/08/2022   ID:  Carol [name], DOB Apr 28, 1928, MRN [id number]  PCP:  Darreld Mclean, MD  Cardiologist:  Candee Furbish, MD  Electrophysiologist:  None   Chief Complaint: f/u afib  History of Present Illness:   Carol [name] is a 88 y.o. female with history of permanent atrial fibrillation, anemia, breast CA s/p remote double mastectomy, dyslipidemia, HTN, PSVT, stroke, dementia, bradycardia (requiring decrease in BB), mild cardiomyopathy (EF 50% in 2017), mild MR/TR/PR by echo 2017 who is seen for follow-up. Per review of notes, she moved here from [place] several years ago. She'd had a reported prior EF of 40% that improved to 50% after rate control. Last scanned echo from 2017 showed EF 50%, LV function variable secondary to afib, mild LAE, moderate RAE, normal RV, mild TR, mild aortic sclerosis without stenosis, mild MR, mild pulm HTN, mild PR, dilated IVC. She has done well over the years on anticoagulation and rate control. It appears her afib is now likely permanent.  She is seen for follow-up today doing well. She is accompanied by her brother who helps aid in her history. His grandson is married to Al Decant who is related to [name] in our office. They also know Nell Range. She lives at [facility]. She has not had any recent CP, palpitations, SOB, bleeding or syncope.   Labwork independently reviewed: 11/2021 K 4.5, Cr 0.95 05/2021 TSH wnl, CBC wnl, LFTs 2020 TSH wnl 2019 LDL 114, trig 100  Cardiology Studies:   Studies reviewed are outlined and summarized above. Reports included below if pertinent.   Scanned echo 2017 EF 50%, LV function variable secondary to afib, mild LAE, moderate RAE, normal RV, mild TR, mild aortic sclerosis without stenosis, mild MR, mild pulm HTN, mild PR, dilated IVC    Past Medical History:  Diagnosis Date   Anemia    Blood transfusion without reported diagnosis    Breast cancer ([place])     Cardiomyopathy ([place])    Cataract    Dyslipidemia    Hyperlipidemia    Hypertension    Paroxysmal supraventricular tachycardia    Permanent atrial fibrillation (HCC)    Stroke [facility])     Past Surgical History:  Procedure Laterality Date   MASTECTOMY     TONSILLECTOMY     WRIST SURGERY      Current Medications: Current Meds  Medication Sig   Cholecalciferol (VITAMIN D3) 1.25 MG (50000 UT) CAPS Take 1 weekly for 12 weeks   losartan (COZAAR) 50 MG tablet Take 1 tablet (50 mg total) by mouth daily. Please call to schedule an overdue appointment with Dr. Marlou Porch for refills, [phone], thank you. 1st attempt.   metoprolol succinate (TOPROL-XL) 25 MG 24 hr tablet Take 1 tablet (25 mg total) by mouth daily. Please call to schedule an overdue appointment with Dr. Marlou Porch for refills, [phone], thank you. 1st attempt.   apixaban (ELIQUIS) 5 MG TABS tablet TAKE 1 TABLET TWICE DAILY      Allergies:   Tramadol, Sertraline, and Zoloft [sertraline hcl]   Social History   Socioeconomic History   Marital status: Widowed    Spouse name: Not on file   Number of children: 2   Years of education: 2 years college   Highest education level: Not on file  Occupational History   Occupation: Retired  Tobacco Use   Smoking status: Never   Smokeless tobacco: Never  Vaping Use   Vaping Use:  Never used  Substance and Sexual Activity   Alcohol use: No   Drug use: No   Sexual activity: Not on file  Other Topics Concern   Not on file  Social History Narrative   Lives in a retirement community.   Right-handed.   Only drinks caffeine some days.   Social Determinants of Health   Financial Resource Strain: Not on file  Food Insecurity: Not on file  Transportation Needs: Not on file  Physical Activity: Not on file  Stress: Not on file  Social Connections: Not on file     Family History:  The patient's family history includes Epilepsy in her mother; Heart disease in her maternal aunt;  Stroke in her father.  ROS:   Please see the history of present illness. All other systems are reviewed and otherwise negative.    EKG(s)/Additional Labs   EKG:  EKG is ordered today, personally reviewed, demonstrating atrial fibrillation 70bpm, minimal criteria for LVH, nonspecific TW changes. Nonacute. TW changes actually appear improved from prior.   Recent Labs: 06/13/2021: ALT 13; Hemoglobin 13.3; Platelets 226.0; TSH 1.40 12/12/2021: BUN 17; Creatinine, Ser 0.95; Potassium 4.5; Sodium 137  Recent Lipid Panel    Component Value Date/Time   CHOL 203 (H) 08/19/2018 1350   CHOL 182 09/12/2016 0943   TRIG 100.0 08/19/2018 1350   HDL 69.10 08/19/2018 1350   HDL 81 09/12/2016 0943   CHOLHDL 3 08/19/2018 1350   VLDL 20.0 08/19/2018 1350   LDLCALC 114 (H) 08/19/2018 1350   LDLCALC 87 09/12/2016 0943    PHYSICAL EXAM:    VS:  BP 122/68   Pulse 70   Ht '5\' 2"'$  (1.575 m)   Wt 137 lb (62.1 kg)   LMP  (LMP Unknown)   SpO2 95%   BMI 25.06 kg/m   BMI: Body mass index is 25.06 kg/m.  GEN: Well nourished, well developed female in no acute distress HEENT: normocephalic, atraumatic Neck: no JVD, carotid bruits, or masses Cardiac: irregularly irregular, rate controlled; no murmurs, rubs, or gallops, no edema  Respiratory:  clear to auscultation bilaterally, normal work of breathing GI: soft, nontender, nondistended, + BS MS: no deformity or atrophy Skin: warm and dry, no rash Neuro:  Alert and Oriented x 3, Strength and sensation are intact, follows commands, +HOH Psych: euthymic mood, full affect  Wt Readings from Last 3 Encounters:  06/08/22 137 lb (62.1 kg)  12/12/21 140 lb (63.5 kg)  06/13/21 137 lb 6.4 oz (62.3 kg)     ASSESSMENT & PLAN:   1. Permanent atrial fib, also with h/o PSVT by PMH - at this juncture, afib is considered permanent. She's been tolerating rate control strategy well. Continue metoprolol and Eliquis - will refill today. Update BMET, CBC today. She  still qualifies for the Eliquis '5mg'$  twice daily dose as long as Cr remains <1.5. We did discuss that going forward if her weight were to drop to 133lb or below (60kg), she should let us know at which time we would need to reconsider her dosing. Will plan to see back in 6 months.  2. Dyslipidemia - they report this will be followed by PCP. Last checked in 2019. Conservative management seems appropriate.  3. Essential HTN - BP well controlled. Update BMET. Continue losartan. Refill today.  4. Mild cardiomyopathy, mild MR, TR, PR - last echocardiogram 2017 showed low-normal EF 50% with only mild valvular heart disease. She has no progression of symptoms or murmur on exam. In the setting  of lack of symptoms, comorbidities and advanced age, agree with following conservatively, reserving repeat imaging only for progressive symptoms.    Disposition: F/u with Dr. Marlou Porch in 6 months.   Medication Adjustments/Labs and Tests Ordered: Current medicines are reviewed at length with the patient today.  Concerns regarding medicines are outlined above. Medication changes, Labs and Tests ordered today are summarized above and listed in the Patient Instructions accessible in Encounters.   Signed, Charlie Pitter, PA-C  06/08/2022 4:09 PM    [place] Phone: [phone]; Fax: [phone]

## 2022-06-08 NOTE — Patient Instructions (Signed)
Medication Instructions:  Your physician recommends that you continue on your current medications as directed. Please refer to the Current Medication list given to you today.  *If you need a refill on your cardiac medications before your next appointment, please call your pharmacy*   Lab Work: TODAY: BMET, CBC  If you have labs (blood work) drawn today and your tests are completely normal, you will receive your results only by: [place] (if you have MyChart) OR A paper copy in the mail If you have any lab test that is abnormal or we need to change your treatment, we will call you to review the results.   Follow-Up: At [facility], you and your health needs are our priority.  As part of our continuing mission to provide you with exceptional heart care, we have created designated Provider Care Teams.  These Care Teams include your primary Cardiologist (physician) and Advanced Practice Providers (APPs -  Physician Assistants and Nurse Practitioners) who all work together to provide you with the care you need, when you need it.  We recommend signing up for the patient portal called "MyChart".  Sign up information is provided on this After Visit Summary.  MyChart is used to connect with patients for Virtual Visits (Telemedicine).  Patients are able to view lab/test results, encounter notes, upcoming appointments, etc.  Non-urgent messages can be sent to your provider as well.   To learn more about what you can do with MyChart, go to [email or web address].    Your next appointment:   6 month(s)  The format for your next appointment:   In Person  Provider:   Candee Furbish, MD     Other Instructions  If you ever get to a point where your weight falls to 133 pounds or lower, call our office to review your Eliquis dose.  Important Information About Sugar

## 2022-06-09 LAB — CBC
Hematocrit: 40 % (ref 34.0–46.6)
Hemoglobin: 13.7 g/dL (ref 11.1–15.9)
MCH: 30.6 pg (ref 26.6–33.0)
MCHC: 34.3 g/dL (ref 31.5–35.7)
MCV: 90 fL (ref 79–97)
Platelets: 202 10*3/uL (ref 150–450)
RBC: 4.47 x10E6/uL (ref 3.77–5.28)
RDW: 13 % (ref 11.7–15.4)
WBC: 7 10*3/uL (ref 3.4–10.8)

## 2022-06-09 LAB — BASIC METABOLIC PANEL
BUN/Creatinine Ratio: 15 (ref 12–28)
BUN: 16 mg/dL (ref 10–36)
CO2: 23 mmol/L (ref 20–29)
Calcium: 9.8 mg/dL (ref 8.7–10.3)
Chloride: 99 mmol/L (ref 96–106)
Creatinine, Ser: 1.09 mg/dL — ABNORMAL HIGH (ref 0.57–1.00)
Glucose: 99 mg/dL (ref 70–99)
Potassium: 4.4 mmol/L (ref 3.5–5.2)
Sodium: 136 mmol/L (ref 134–144)
eGFR: 48 mL/min/{1.73_m2} — ABNORMAL LOW (ref >59)

## 2022-06-11 NOTE — Progress Notes (Unsigned)
[place] at [facility] [address], [place], [place] [address] [phone]  Date:  06/14/2022   Name:  Carol [name]   DOB:  Nov 01, 1927   MRN:  [id number]  PCP:  Darreld Mclean, MD    Chief Complaint: No chief complaint on file.   History of Present Illness:  Carol [name] is a 88 y.o. very pleasant female patient who presents with the following:  Patient seen today for periodic follow-up- history of a fib on eliquis, hyperlipidemia, HTN, mild dementia, breast cancer, prior stroke, vitamin D deficiency Most recent visit with myself was in April Her brother [name] and sister-in-law [name] are her main supports.  She lives at the [facility] in assisted living  She was seen by cardiology on October 12; stable, no changes made at that time  Second dose of Shingrix? Flu shot Recommend latest COVID-vaccine  Labs done earlier this month-BMP, CBC  Eliquis Losartan Toprol-XL  Patient Active Problem List   Diagnosis Date Noted   Paroxysmal atrial fibrillation ([place]) 08/13/2018   Right knee pain 11/16/2017   Osteopenia 11/12/2016   Mild cognitive disorder 10/10/2016   Chronic anticoagulation 10/04/2016   History of atrial fibrillation 10/04/2016   Essential hypertension, benign 09/01/2016   Paroxysmal supraventricular tachycardia 09/01/2016   Hyperlipidemia 09/01/2016    Past Medical History:  Diagnosis Date   Anemia    Blood transfusion without reported diagnosis    Breast cancer ([place])    Cardiomyopathy ([place])    Cataract    Dyslipidemia    Hyperlipidemia    Hypertension    Paroxysmal supraventricular tachycardia    Permanent atrial fibrillation ([place])    Stroke [facility])     Past Surgical History:  Procedure Laterality Date   MASTECTOMY     TONSILLECTOMY     WRIST SURGERY      Social History   Tobacco Use   Smoking status: Never   Smokeless tobacco: Never  Vaping Use   Vaping Use: Never used   Substance Use Topics   Alcohol use: No   Drug use: No    Family History  Problem Relation Age of Onset   Epilepsy Mother    Stroke Father    Heart disease Maternal Aunt     Allergies  Allergen Reactions   Tramadol Other (See Comments)    seizure   Sertraline Other (See Comments)   Zoloft [Sertraline Hcl]     headache    Medication list has been reviewed and updated.  Current Outpatient Medications on File Prior to Visit  Medication Sig Dispense Refill   apixaban (ELIQUIS) 5 MG TABS tablet Take 1 tablet (5 mg total) by mouth 2 (two) times daily. 180 tablet 3   Cholecalciferol (VITAMIN D3) 1.25 MG (50000 UT) CAPS Take 1 weekly for 12 weeks 12 capsule 0   losartan (COZAAR) 50 MG tablet Take 1 tablet (50 mg total) by mouth daily. 90 tablet 3   metoprolol succinate (TOPROL-XL) 25 MG 24 hr tablet Take 1 tablet (25 mg total) by mouth daily. 90 tablet 3   No current facility-administered medications on file prior to visit.    Review of Systems:  As per HPI- otherwise negative.   Physical Examination: There were no vitals filed for this visit. There were no vitals filed for this visit. There is no height or weight on file to calculate BMI. Ideal Body Weight:    GEN: no acute distress. HEENT:  Atraumatic, Normocephalic.  Ears and Nose: No external deformity. CV: RRR, No M/G/R. No JVD. No thrill. No extra heart sounds. PULM: CTA B, no wheezes, crackles, rhonchi. No retractions. No resp. distress. No accessory muscle use. ABD: S, NT, ND, +BS. No rebound. No HSM. EXTR: No c/c/e PSYCH: Normally interactive. Conversant.    Assessment and Plan: ***  Signed Lamar Blinks, MD

## 2022-06-11 NOTE — Patient Instructions (Incomplete)
It was great to see again today, assuming all is well please see me in about 6 months Recommend the latest COVID booster, dose of RSV if not done already

## 2022-06-12 NOTE — Addendum Note (Signed)
Addended by: Carylon Perches on: 06/12/2022 07:48 AM   Modules accepted: Orders

## 2022-06-14 ENCOUNTER — Ambulatory Visit (INDEPENDENT_AMBULATORY_CARE_PROVIDER_SITE_OTHER): Payer: Medicare HMO | Admitting: Family Medicine

## 2022-06-14 ENCOUNTER — Telehealth: Payer: Self-pay | Admitting: Physician Assistant

## 2022-06-14 VITALS — BP 122/80 | HR 81 | Temp 97.8°F | Resp 18 | Ht 62.0 in | Wt 138.8 lb

## 2022-06-14 DIAGNOSIS — R944 Abnormal results of kidney function studies: Secondary | ICD-10-CM | POA: Diagnosis not present

## 2022-06-14 DIAGNOSIS — Z23 Encounter for immunization: Secondary | ICD-10-CM

## 2022-06-14 DIAGNOSIS — I10 Essential (primary) hypertension: Secondary | ICD-10-CM | POA: Diagnosis not present

## 2022-06-14 DIAGNOSIS — I4891 Unspecified atrial fibrillation: Secondary | ICD-10-CM

## 2022-06-14 NOTE — Telephone Encounter (Signed)
Carol Pitter, PA-C  06/09/2022  8:35 AM EDT     Note: patient has dementia and lives at [facility]. Tee, please let them know her kidney function looks marginally up compared to prior. At yesterday's visit however she was feeling great. Wonder if this is age-related decline since otherwise no recent acute decompensation. I am not inclined to change her losartan at present time but would suggest she have this repeated when she see PCP to ensure trends OK. In the meantime just important to make sure she is staying well hydrated. Will cc Dr. Lorelei Pont here so she is aware - has visit 10/18.   The patient's daughter-in-law has been notified of the result and verbalized understanding.  All questions (if any) were answered. Antonieta Iba, RN 06/14/2022 11:10 AM

## 2022-06-14 NOTE — Telephone Encounter (Signed)
Patient's sister returned call for lab results.

## 2022-07-10 ENCOUNTER — Telehealth: Payer: Self-pay | Admitting: *Deleted

## 2022-07-10 NOTE — Telephone Encounter (Signed)
LMOM for pt to return call to schedule AWV.

## 2022-08-10 ENCOUNTER — Ambulatory Visit (HOSPITAL_BASED_OUTPATIENT_CLINIC_OR_DEPARTMENT_OTHER)
Admission: RE | Admit: 2022-08-10 | Discharge: 2022-08-10 | Disposition: A | Discharge status: Home / Self Care | Payer: Medicare HMO | Source: Ambulatory Visit | Attending: Family Medicine | Admitting: Family Medicine

## 2022-08-10 ENCOUNTER — Encounter: Payer: Self-pay | Admitting: Family Medicine

## 2022-08-10 ENCOUNTER — Telehealth: Payer: Self-pay | Admitting: Family Medicine

## 2022-08-10 ENCOUNTER — Ambulatory Visit (INDEPENDENT_AMBULATORY_CARE_PROVIDER_SITE_OTHER): Payer: Medicare HMO | Admitting: Family Medicine

## 2022-08-10 VITALS — BP 118/72 | HR 74 | Temp 97.9°F | Resp 18 | Ht 62.0 in | Wt 139.4 lb

## 2022-08-10 DIAGNOSIS — R410 Disorientation, unspecified: Secondary | ICD-10-CM | POA: Diagnosis not present

## 2022-08-10 DIAGNOSIS — R3129 Other microscopic hematuria: Secondary | ICD-10-CM | POA: Diagnosis not present

## 2022-08-10 DIAGNOSIS — I517 Cardiomegaly: Secondary | ICD-10-CM | POA: Diagnosis not present

## 2022-08-10 DIAGNOSIS — R4182 Altered mental status, unspecified: Secondary | ICD-10-CM | POA: Diagnosis not present

## 2022-08-10 LAB — POCT URINALYSIS DIP (MANUAL ENTRY)
Bilirubin, UA: NEGATIVE
Glucose, UA: NEGATIVE mg/dL
Ketones, POC UA: NEGATIVE mg/dL
Leukocytes, UA: NEGATIVE
Nitrite, UA: NEGATIVE
Protein Ur, POC: NEGATIVE mg/dL
Spec Grav, UA: 1.01 (ref 1.010–1.025)
Urobilinogen, UA: 0.2 E.U./dL
pH, UA: 6 (ref 5.0–8.0)

## 2022-08-10 NOTE — Telephone Encounter (Signed)
Called her SIL [name] back- Carol [name] has not shown any particular physical symptoms but has seemed much more confused and easily upset than normal for the last 2 to 3 days.  We will see her at 240 today, will evaluate for infection or other secondary cause

## 2022-08-10 NOTE — Telephone Encounter (Signed)
Are you willing to send in a prescription for her for these sxs?

## 2022-08-10 NOTE — Telephone Encounter (Signed)
This was addressed in other telephone encounter.

## 2022-08-10 NOTE — Progress Notes (Addendum)
[place] at [facility] [address] [place], [place] [address] [phone]  Date:  08/10/2022   Name:  Carol [name]   DOB:  Nov 24, 1927   MRN:  [id number]  PCP:  Darreld Mclean, MD    Chief Complaint: Altered Mental Status   History of Present Illness:  Carol [name] is a 88 y.o. very pleasant female patient who presents with the following:  Patient seen today for worsening of baseline confusion.  Most recent visit with myself was about 2 months ago- history of a fib on eliquis, hyperlipidemia, HTN, mild dementia, breast cancer, prior stroke, vitamin D deficiency   Her SIL [name] is here with her today- they have noted Carol [name]'s confusion getting a bit worse gradually However 3 days ago there was a more sudden change and she was more confused than she has been- she was looking for her husband who died long ago, etc  They have otherwise not noted any particular symptoms - no fever, cough, or any pains She has had all her vaccines including covid and RSV   She is in assisted living at the [facility]- they are able to handle her memory concerns so far They help assist with meds, meals, getting up in the am, etc She continues to do a fair amount of walking around the [facility] Readings from Last 3 Encounters:  08/10/22 139 lb 6.4 oz (63.2 kg)  06/14/22 138 lb 12.8 oz (63 kg)  06/08/22 137 lb (62.1 kg)   Weight is stable   Seen by cardiology in October: 1. Permanent atrial fib, also with h/o PSVT by PMH - at this juncture, afib is considered permanent. She's been tolerating rate control strategy well. Continue metoprolol and Eliquis - will refill today. Update BMET, CBC today. She still qualifies for the Eliquis '5mg'$  twice daily dose as long as Cr remains <1.5. We did discuss that going forward if her weight were to drop to 133lb or below (60kg), she should let us know at which time we would need to reconsider her dosing.  Will plan to see back in 6 months. 2. Dyslipidemia - they report this will be followed by PCP. Last checked in 2019. Conservative management seems appropriate. 3. Essential HTN - BP well controlled. Update BMET. Continue losartan. Refill today. 4. Mild cardiomyopathy, mild MR, TR, PR - last echocardiogram 2017 showed low-normal EF 50% with only mild valvular heart disease. She has no progression of symptoms or murmur on exam. In the setting of lack of symptoms, comorbidities and advanced age, agree with following conservatively, reserving repeat imaging only for progressive symptoms.   Patient Active Problem List   Diagnosis Date Noted   Paroxysmal atrial fibrillation ([place]) 08/13/2018   Right knee pain 11/16/2017   Osteopenia 11/12/2016   Mild cognitive disorder 10/10/2016   Chronic anticoagulation 10/04/2016   History of atrial fibrillation 10/04/2016   Essential hypertension, benign 09/01/2016   Paroxysmal supraventricular tachycardia 09/01/2016   Hyperlipidemia 09/01/2016    Past Medical History:  Diagnosis Date   Anemia    Blood transfusion without reported diagnosis    Breast cancer ([place])    Cardiomyopathy ([place])    Cataract    Dyslipidemia    Hyperlipidemia    Hypertension    Paroxysmal supraventricular tachycardia    Permanent atrial fibrillation ([place])    Stroke [facility])     Past Surgical History:  Procedure Laterality Date   MASTECTOMY  TONSILLECTOMY     WRIST SURGERY      Social History   Tobacco Use   Smoking status: Never   Smokeless tobacco: Never  Vaping Use   Vaping Use: Never used  Substance Use Topics   Alcohol use: No   Drug use: No    Family History  Problem Relation Age of Onset   Epilepsy Mother    Stroke Father    Heart disease Maternal Aunt     Allergies  Allergen Reactions   Tramadol Other (See Comments)    seizure   Sertraline Other (See Comments)   Zoloft [Sertraline Hcl]     headache    Medication list has been reviewed and  updated.  Current Outpatient Medications on File Prior to Visit  Medication Sig Dispense Refill   apixaban (ELIQUIS) 5 MG TABS tablet Take 1 tablet (5 mg total) by mouth 2 (two) times daily. 180 tablet 3   Cholecalciferol (VITAMIN D3) 1.25 MG (50000 UT) CAPS Take 1 weekly for 12 weeks 12 capsule 0   Cholecalciferol (VITAMIN D3) 1000 units CAPS Take by mouth.     losartan (COZAAR) 50 MG tablet Take 1 tablet (50 mg total) by mouth daily. 90 tablet 3   metoprolol succinate (TOPROL-XL) 25 MG 24 hr tablet Take 1 tablet (25 mg total) by mouth daily. 90 tablet 3   No current facility-administered medications on file prior to visit.    Review of Systems:  As per HPI- otherwise negative.   Physical Examination: Vitals:   08/10/22 1439  BP: 118/72  Pulse: 74  Resp: 18  Temp: 97.9 F (36.6 C)  SpO2: 94%   Vitals:   08/10/22 1439  Weight: 139 lb 6.4 oz (63.2 kg)  Height: '5\' 2"'$  (1.575 m)   Body mass index is 25.5 kg/m. Ideal Body Weight: Weight in (lb) to have BMI = 25: 136.4  GEN: no acute distress. Looks well and her normal self  HEENT: Atraumatic, Normocephalic.  Bilateral TM wnl, oropharynx normal.  PEERL,EOMI.   Ears and Nose: No external deformity. CV: RRR, No M/G/R. No JVD. No thrill. No extra heart sounds. PULM: CTA B, no wheezes, crackles, rhonchi. No retractions. No resp. distress. No accessory muscle use. ABD: S, NT, ND. No rebound. No HSM. EXTR: No c/c/e PSYCH: Normally interactive. Conversant.   Pulse Readings from Last 3 Encounters:  08/10/22 74  06/14/22 81  06/08/22 70   Results for orders placed or performed in visit on 08/10/22  POCT urinalysis dipstick  Result Value Ref Range   Color, UA yellow yellow   Clarity, UA clear clear   Glucose, UA negative negative mg/dL   Bilirubin, UA negative negative   Ketones, POC UA negative negative mg/dL   Spec Grav, UA 1.010 1.010 - 1.025   Blood, UA moderate (A) negative   pH, UA 6.0 5.0 - 8.0   Protein Ur, POC  negative negative mg/dL   Urobilinogen, UA 0.2 0.2 or 1.0 E.U./dL   Nitrite, UA Negative Negative   Leukocytes, UA Negative Negative    Assessment and Plan: Disorientation - Plan: CBC, Comprehensive metabolic panel, TSH, Urine Culture, POCT urinalysis dipstick, DG Chest 2 View, Urine Microscopic Only  Microhematuria - Plan: Urine Microscopic Only  Pt seen today for concern of worsening dementia sx She is calm, seems her normal self in visit Answers questions and states she feels well Further work-up pending as above Will plan further follow- up pending labs. They will let me know if  any changes or concerns in the meantime  Signed Lamar Blinks, MD  DG Chest 2 View  Result Date: 08/10/2022 CLINICAL DATA:  Mental status change.  Rule out pneumonia EXAM: CHEST - 2 VIEW COMPARISON:  09/18/2019 FINDINGS: Cardiac enlargement without heart failure. Lungs are clear without infiltrate effusion. Negative for pneumonia. Surgical clips in the axilla bilaterally.  Bilateral mastectomy. IMPRESSION: No active cardiopulmonary disease. Electronically Signed   By: Franchot Gallo M.D.   On: 08/10/2022 16:03   Message to pt with CXR report   Received her labs 12/15- message to pt  Results for orders placed or performed in visit on 08/10/22  CBC  Result Value Ref Range   WBC 6.4 4.0 - 10.5 K/uL   RBC 4.47 3.87 - 5.11 Mil/uL   Platelets 218.0 150.0 - 400.0 K/uL   Hemoglobin 13.5 12.0 - 15.0 g/dL   HCT 40.7 36.0 - 46.0 %   MCV 91.1 78.0 - 100.0 fl   MCHC 33.3 30.0 - 36.0 g/dL   RDW 14.5 11.5 - 15.5 %  Comprehensive metabolic panel  Result Value Ref Range   Sodium 136 135 - 145 mEq/L   Potassium 3.7 3.5 - 5.1 mEq/L   Chloride 98 96 - 112 mEq/L   CO2 27 19 - 32 mEq/L   Glucose, Bld 94 70 - 99 mg/dL   BUN 13 6 - 23 mg/dL   Creatinine, Ser 0.87 0.40 - 1.20 mg/dL   Total Bilirubin 0.9 0.2 - 1.2 mg/dL   Alkaline Phosphatase 54 39 - 117 U/L   AST 25 0 - 37 U/L   ALT 13 0 - 35 U/L   Total  Protein 7.2 6.0 - 8.3 g/dL   Albumin 4.3 3.5 - 5.2 g/dL   GFR 57.95 (L) >60.00 mL/min   Calcium 9.6 8.4 - 10.5 mg/dL  TSH  Result Value Ref Range   TSH 2.43 0.35 - 5.50 uIU/mL  Urine Microscopic Only  Result Value Ref Range   WBC, UA 3-6/hpf (A) 0-2/hpf   RBC / HPF 3-6/hpf (A) 0-2/hpf   Squamous Epithelial / LPF Rare(0-4/hpf) Rare(0-4/hpf)   Renal Epithel, UA Rare(0-4/hpf) (A) None   Bacteria, UA Rare(<10/hpf) (A) None  POCT urinalysis dipstick  Result Value Ref Range   Color, UA yellow yellow   Clarity, UA clear clear   Glucose, UA negative negative mg/dL   Bilirubin, UA negative negative   Ketones, POC UA negative negative mg/dL   Spec Grav, UA 1.010 1.010 - 1.025   Blood, UA moderate (A) negative   pH, UA 6.0 5.0 - 8.0   Protein Ur, POC negative negative mg/dL   Urobilinogen, UA 0.2 0.2 or 1.0 E.U./dL   Nitrite, UA Negative Negative   Leukocytes, UA Negative Negative

## 2022-08-10 NOTE — Telephone Encounter (Signed)
Patient's sister in law, Carol [name], called to advise that patient has been agitated and restless the last couple of days and she wants to know if a sedative could be called in for her. Patient's husband passed away but she thinks that he has left her and her sons. Please call Carol [name] to advise. Patient uses [facility] on [address] in [place]

## 2022-08-11 ENCOUNTER — Encounter: Payer: Self-pay | Admitting: Family Medicine

## 2022-08-11 DIAGNOSIS — R410 Disorientation, unspecified: Secondary | ICD-10-CM

## 2022-08-11 LAB — COMPREHENSIVE METABOLIC PANEL
ALT: 13 U/L (ref 0–35)
AST: 25 U/L (ref 0–37)
Albumin: 4.3 g/dL (ref 3.5–5.2)
Alkaline Phosphatase: 54 U/L (ref 39–117)
BUN: 13 mg/dL (ref 6–23)
CO2: 27 mEq/L (ref 19–32)
Calcium: 9.6 mg/dL (ref 8.4–10.5)
Chloride: 98 mEq/L (ref 96–112)
Creatinine, Ser: 0.87 mg/dL (ref 0.40–1.20)
GFR: 57.95 mL/min — ABNORMAL LOW (ref >60.00)
Glucose, Bld: 94 mg/dL (ref 70–99)
Potassium: 3.7 mEq/L (ref 3.5–5.1)
Sodium: 136 mEq/L (ref 135–145)
Total Bilirubin: 0.9 mg/dL (ref 0.2–1.2)
Total Protein: 7.2 g/dL (ref 6.0–8.3)

## 2022-08-11 LAB — CBC
HCT: 40.7 % (ref 36.0–46.0)
Hemoglobin: 13.5 g/dL (ref 12.0–15.0)
MCHC: 33.3 g/dL (ref 30.0–36.0)
MCV: 91.1 fl (ref 78.0–100.0)
Platelets: 218 10*3/uL (ref 150.0–400.0)
RBC: 4.47 Mil/uL (ref 3.87–5.11)
RDW: 14.5 % (ref 11.5–15.5)
WBC: 6.4 10*3/uL (ref 4.0–10.5)

## 2022-08-11 LAB — URINALYSIS, MICROSCOPIC ONLY

## 2022-08-11 LAB — URINE CULTURE
MICRO NUMBER:: 14315823
SPECIMEN QUALITY:: ADEQUATE

## 2022-08-11 LAB — TSH: TSH: 2.43 u[IU]/mL (ref 0.35–5.50)

## 2022-08-12 MED ORDER — QUETIAPINE FUMARATE 25 MG PO TABS: 25.0000 mg | ORAL_TABLET | Freq: Every day | ORAL | 3 refills | Status: DC

## 2022-08-13 ENCOUNTER — Encounter: Payer: Self-pay | Admitting: Family Medicine

## 2022-08-13 DIAGNOSIS — R3129 Other microscopic hematuria: Secondary | ICD-10-CM

## 2022-09-06 ENCOUNTER — Encounter: Payer: Self-pay | Admitting: Family Medicine

## 2022-09-14 ENCOUNTER — Other Ambulatory Visit: Payer: Medicare HMO

## 2022-09-14 DIAGNOSIS — R3129 Other microscopic hematuria: Secondary | ICD-10-CM

## 2022-09-14 NOTE — Progress Notes (Signed)
Pt unable to urinate at appointment today. Sent cup and cleansing wipe home with her to collect first thing tomorrow and return ASAP.  Changed orders to future, no charge today.

## 2022-09-14 NOTE — Addendum Note (Signed)
Addended by: Kelle Darting A on: 09/14/2022 05:01 PM   Modules accepted: Orders

## 2022-09-15 ENCOUNTER — Encounter: Payer: Self-pay | Admitting: Family Medicine

## 2022-09-15 ENCOUNTER — Other Ambulatory Visit (INDEPENDENT_AMBULATORY_CARE_PROVIDER_SITE_OTHER): Payer: Medicare HMO

## 2022-09-15 DIAGNOSIS — R3129 Other microscopic hematuria: Secondary | ICD-10-CM

## 2022-09-15 LAB — URINALYSIS, MICROSCOPIC ONLY

## 2022-09-16 LAB — URINE CULTURE
MICRO NUMBER:: 14448136
SPECIMEN QUALITY:: ADEQUATE

## 2022-09-17 ENCOUNTER — Encounter: Payer: Self-pay | Admitting: Family Medicine

## 2022-10-03 ENCOUNTER — Encounter: Payer: Self-pay | Admitting: Family Medicine

## 2022-10-03 DIAGNOSIS — R410 Disorientation, unspecified: Secondary | ICD-10-CM

## 2022-10-03 MED ORDER — QUETIAPINE FUMARATE 25 MG PO TABS: 25.0000 mg | ORAL_TABLET | Freq: Every day | ORAL | 1 refills | Status: DC

## 2022-10-04 ENCOUNTER — Ambulatory Visit (INDEPENDENT_AMBULATORY_CARE_PROVIDER_SITE_OTHER): Payer: Medicare HMO

## 2022-10-04 VITALS — Wt 139.0 lb

## 2022-10-04 DIAGNOSIS — Z0000 Encounter for general adult medical examination without abnormal findings: Secondary | ICD-10-CM | POA: Diagnosis not present

## 2022-10-04 NOTE — Progress Notes (Signed)
I connected with  Carol [name] on 10/04/22 by a audio enabled telemedicine application and verified that I am speaking with the correct person using two identifiers. With brother Carol [name]   Patient Location: Home  Provider Location: Home Office  I discussed the limitations of evaluation and management by telemedicine. The patient expressed understanding and agreed to proceed.   Subjective:   Carol [name] is a 89 y.o. female who presents for an Initial Medicare Annual Wellness Visit.  Review of Systems     Cardiac Risk Factors include: advanced age (>62mn, >>60women);dyslipidemia;hypertension     Objective:    Today's Vitals   10/04/22 1348  Weight: 139 lb (63 kg)   Body mass index is 25.42 kg/m.     10/04/2022    1:57 PM 09/05/2019    2:32 AM 10/03/2016    4:30 PM  Advanced Directives  Does Patient Have a Medical Advance Directive? Yes No Yes  Type of AParamedicof APal[place]joLiving will    Does patient want to make changes to medical advance directive?   No - Patient declined  Copy of HGoshenin Chart? No - copy requested    Would patient like information on creating a medical advance directive?  No - Patient declined     Current Medications (verified) Outpatient Encounter Medications as of 10/04/2022  Medication Sig   ABRYSVO 120 MCG/0.5ML injection    apixaban (ELIQUIS) 5 MG TABS tablet Take 1 tablet (5 mg total) by mouth 2 (two) times daily.   Cholecalciferol (VITAMIN D3) 1000 units CAPS Take by mouth.   losartan (COZAAR) 50 MG tablet Take 1 tablet (50 mg total) by mouth daily.   metoprolol succinate (TOPROL-XL) 25 MG 24 hr tablet Take 1 tablet (25 mg total) by mouth daily.   QUEtiapine (SEROQUEL) 25 MG tablet Take 1 tablet (25 mg total) by mouth at bedtime. Increase to BID as directed by MD   [DISCONTINUED] Cholecalciferol (VITAMIN D3) 1.25 MG (50000 UT) CAPS Take 1 weekly for 12 weeks   No facility-administered encounter  medications on file as of 10/04/2022.    Allergies (verified) Tramadol, Sertraline, and Zoloft [sertraline hcl]   History: Past Medical History:  Diagnosis Date   Anemia    Blood transfusion without reported diagnosis    [place]    [place] [place]    Cataract    Dyslipidemia    Hyperlipidemia    Hypertension    Paroxysmal supraventricular tachycardia    Permanent atrial fibrillation (HCC)    Stroke ([facility]    Past Surgical History:  Procedure Laterality Date   MASTECTOMY     TONSILLECTOMY     WRIST SURGERY     Family History  Problem Relation Age of Onset   Epilepsy Mother    Stroke Father    Heart disease Maternal Aunt    Social History   Socioeconomic History   Marital status: Widowed    Spouse name: Not on file   Number of children: 2   Years of education: 2 years college   Highest education level: Not on file  Occupational History   Occupation: Retired  Tobacco Use   Smoking status: Never   Smokeless tobacco: Never  Vaping Use   Vaping Use: Never used  Substance and Sexual Activity   Alcohol use: No   Drug use: No   Sexual activity: Not on file  Other Topics Concern   Not on file  Social History  Narrative   Lives in a retirement community.   Right-handed.   Only drinks caffeine some days.   Social Determinants of Health   Financial Resource Strain: Low Risk  (10/04/2022)   Overall Financial Resource Strain (CARDIA)    Difficulty of Paying Living Expenses: Not hard at all  Food Insecurity: No Food Insecurity (10/04/2022)   Hunger Vital Sign    Worried About Running Out of Food in the Last Year: Never true    Ran Out of Food in the Last Year: Never true  Transportation Needs: No Transportation Needs (10/04/2022)   PRAPARE - Hydrologist (Medical): No    Lack of Transportation (Non-Medical): No  Physical Activity: Insufficiently Active (10/04/2022)   Exercise Vital Sign    Days of Exercise per Week: 4 days     Minutes of Exercise per Session: 10 min  Stress: No Stress Concern Present (10/04/2022)   [place]    Feeling of Stress : Not at all  Social Connections: Moderately Isolated (10/04/2022)   Social Connection and Isolation Panel [NHANES]    Frequency of Communication with Friends and Family: Three times a week    Frequency of Social Gatherings with Friends and Family: Twice a week    Attends Religious Services: 1 to 4 times per year    Active Member of [employer] or Organizations: No    Attends Archivist Meetings: Never    Marital Status: Widowed    Tobacco Counseling Counseling given: Not Answered   Clinical Intake:  Pre-visit preparation completed: Yes  Pain : No/denies pain     BMI - recorded: 25.42 Nutritional Status: BMI 25 -29 Overweight Nutritional Risks: None Diabetes: No  How often do you need to have someone help you when you read instructions, pamphlets, or other written materials from your doctor or pharmacy?: 1 - Never  Diabetic?no  Interpreter Needed?: No  Information entered by :: Charlott Rakes, LPN   Activities of Daily Living    10/04/2022    1:58 PM  In your present state of health, do you have any difficulty performing the following activities:  Hearing? 1  Comment hearing aids  Vision? 0  Difficulty concentrating or making decisions? 0  Walking or climbing stairs? 0  Dressing or bathing? 0  Doing errands, shopping? 0  Preparing Food and eating ? Y  Comment meals are provided  Using the Toilet? N  In the past six months, have you accidently leaked urine? N  Do you have problems with loss of bowel control? N  Managing your Medications? N  Managing your Finances? N  Housekeeping or managing your Housekeeping? N    Patient Care Team: Copland, Gay Filler, MD as PCP - General (Family Medicine) Jerline Pain, MD as PCP - Cardiology (Cardiology)  Indicate any recent Medical  Services you may have received from other than [facility] providers in the past year (date may be approximate).     Assessment:   This is a routine wellness examination for Carol [name].  Hearing/Vision screen Hearing Screening - Comments:: Has hearing aids  Vision Screening - Comments:: Pt encouraged to follow up with a providers   Dietary issues and exercise activities discussed: Current Exercise Habits: Structured exercise class, Time (Minutes): 10, Frequency (Times/Week): 4, Weekly Exercise (Minutes/Week): 40   Goals Addressed             This Visit's Progress    Patient Stated  None at this time        Depression Screen    10/04/2022    1:55 PM 12/12/2021    1:02 PM 12/08/2020    1:04 PM 09/18/2019   11:04 AM 11/07/2017    2:58 PM  PHQ 2/9 Scores  PHQ - 2 Score 0 0 0 0 0    Fall Risk    10/04/2022    1:58 PM 12/12/2021    1:02 PM 12/08/2020    1:02 PM 09/18/2019   11:04 AM 11/07/2017    2:58 PM  [place] in the past year? 0 0 1 1 Yes  Number falls in past yr: 0 0 0 0 1  Injury with Fall? 0 0 0 0 Yes  Risk for fall due to : Impaired vision    Impaired mobility  Follow up Falls prevention discussed   Falls evaluation completed     FALL RISK PREVENTION PERTAINING TO THE HOME:  Any stairs in or around the home? Yes  If so, are there any without handrails? No  Home free of loose throw rugs in walkways, pet beds, electrical cords, etc? Yes  Adequate lighting in your home to reduce risk of falls? Yes   ASSISTIVE DEVICES UTILIZED TO PREVENT FALLS:  Life alert? Yes  Use of a cane, walker or w/c? No  Grab bars in the bathroom? Yes  Shower chair or bench in shower? No  Elevated toilet seat or a handicapped toilet? No   TIMED UP AND GO:  Was the test performed? No .   Cognitive Function:    09/11/2018   11:07 AM  MMSE - Mini Mental State Exam  Orientation to time 4  Orientation to Place 5  Registration 3  Attention/ Calculation 5  Recall 2  Language-  name 2 objects 2  Language- repeat 1  Language- follow 3 step command 3  Language- read & follow direction 1  Write a sentence 1  Copy design 1  Total score 28        10/04/2022    2:00 PM  6CIT Screen  What Year? 4 points  What month? 0 points  What time? 0 points  Count back from 20 0 points  Months in reverse 4 points  Repeat phrase 10 points  Total Score 18 points    Immunizations Immunization History  Administered Date(s) Administered   Fluad Quad(high Dose 65+) 06/10/2020, 06/13/2021   Influenza Split 08/08/2018, 05/13/2019, 05/10/2022   Influenza, High Dose Seasonal PF 11/07/2017, 08/08/2018, 05/13/2019, 06/10/2020   Influenza-Unspecified 08/08/2018, 05/13/2019   Moderna Sars-Covid-2 Vaccination 10/22/2019, 11/19/2019, 07/20/2020   Pneumococcal Conjugate-13 11/02/2016   Pneumococcal Polysaccharide-23 12/12/2021   Td 12/08/2020   Zoster Recombinat (Shingrix) 01/25/2022    TDAP status: Up to date  Flu Vaccine status: Up to date  Pneumococcal vaccine status: Up to date  Covid-19 vaccine status: Completed vaccines  Qualifies for Shingles Vaccine? Yes   Zostavax completed Yes   Shingrix Completed?: Yes  Screening Tests Health Maintenance  Topic Date Due   Zoster Vaccines- Shingrix (2 of 2) 03/22/2022   COVID-19 Vaccine (4 - 2023-24 season) 04/28/2022   Medicare Annual Wellness (AWV)  10/05/2023   DTaP/Tdap/Td (2 - Tdap) 12/09/2030   Pneumonia Vaccine 31+ Years old  Completed   INFLUENZA VACCINE  Completed   DEXA SCAN  Completed   HPV VACCINES  Aged Out    Health Maintenance  Health Maintenance Due  Topic Date Due   Zoster  Vaccines- Shingrix (2 of 2) 03/22/2022   COVID-19 Vaccine (4 - 2023-24 season) 04/28/2022    Colorectal cancer screening: No longer required.   Mammogram status: No longer required due to age.     Additional Screening:  Vision Screening: Recommended annual ophthalmology exams for early detection of glaucoma and other  disorders of the eye. Is the patient up to date with their annual eye exam?  No  Who is the provider or what is the name of the office in which the patient attends annual eye exams? Unsure of provider  If pt is not established with a provider, would they like to be referred to a provider to establish care? No .   Dental Screening: Recommended annual dental exams for proper oral hygiene  Community Resource Referral / Chronic Care Management: CRR required this visit?  No   CCM required this visit?  No      Plan:     I have personally reviewed and noted the following in the patient's chart:   Medical and social history Use of alcohol, tobacco or illicit drugs  Current medications and supplements including opioid prescriptions. Patient is not currently taking opioid prescriptions. Functional ability and status Nutritional status Physical activity Advanced directives List of other physicians Hospitalizations, surgeries, and ER visits in previous 12 months Vitals Screenings to include cognitive, depression, and falls Referrals and appointments  In addition, I have reviewed and discussed with patient certain preventive protocols, quality metrics, and best practice recommendations. A written personalized care plan for preventive services as well as general preventive health recommendations were provided to patient.     Willette Brace, LPN   01/30/7845   Nurse Notes: none

## 2022-10-04 NOTE — Patient Instructions (Signed)
Carol [name] , Thank you for taking time to come for your Medicare Wellness Visit. I appreciate your ongoing commitment to your health goals. Please review the following plan we discussed and let me know if I can assist you in the future.   These are the goals we discussed:  Goals   None     This is a list of the screening recommended for you and due dates:  Health Maintenance  Topic Date Due   Zoster (Shingles) Vaccine (2 of 2) 03/22/2022   COVID-19 Vaccine (4 - 2023-24 season) 04/28/2022   Medicare Annual Wellness Visit  10/05/2023   DTaP/Tdap/Td vaccine (2 - Tdap) 12/09/2030   Pneumonia Vaccine  Completed   Flu Shot  Completed   DEXA scan (bone density measurement)  Completed   HPV Vaccine  Aged Out    Advanced directives: Please bring a copy of your health care power of attorney and living will to the office at your convenience.  Conditions/risks identified: none at this time   Next appointment: Follow up in one year for your annual wellness visit    Preventive Care 65 Years and Older, Female Preventive care refers to lifestyle choices and visits with your health care provider that can promote health and wellness. What does preventive care include? A yearly physical exam. This is also called an annual well check. Dental exams once or twice a year. Routine eye exams. Ask your health care provider how often you should have your eyes checked. Personal lifestyle choices, including: Daily care of your teeth and gums. Regular physical activity. Eating a healthy diet. Avoiding tobacco and drug use. Limiting alcohol use. Practicing safe sex. Taking low-dose aspirin every day. Taking vitamin and mineral supplements as recommended by your health care provider. What happens during an annual well check? The services and screenings done by your health care provider during your annual well check will depend on your age, overall health, lifestyle risk factors, and family history of  disease. Counseling  Your health care provider may ask you questions about your: Alcohol use. Tobacco use. Drug use. Emotional well-being. Home and relationship well-being. Sexual activity. Eating habits. History of falls. Memory and ability to understand (cognition). Work and work Statistician. Reproductive health. Screening  You may have the following tests or measurements: Height, weight, and BMI. Blood pressure. Lipid and cholesterol levels. These may be checked every 5 years, or more frequently if you are over 60 years old. Skin check. Lung cancer screening. You may have this screening every year starting at age 53 if you have a 30-pack-year history of smoking and currently smoke or have quit within the past 15 years. Fecal occult blood test (FOBT) of the stool. You may have this test every year starting at age 58. Flexible sigmoidoscopy or colonoscopy. You may have a sigmoidoscopy every 5 years or a colonoscopy every 10 years starting at age 52. Hepatitis C blood test. Hepatitis B blood test. Sexually transmitted disease (STD) testing. Diabetes screening. This is done by checking your blood sugar (glucose) after you have not eaten for a while (fasting). You may have this done every 1-3 years. Bone density scan. This is done to screen for osteoporosis. You may have this done starting at age 65. Mammogram. This may be done every 1-2 years. Talk to your health care provider about how often you should have regular mammograms. Talk with your health care provider about your test results, treatment options, and if necessary, the need for more tests. Vaccines  Your health care provider may recommend certain vaccines, such as: Influenza vaccine. This is recommended every year. Tetanus, diphtheria, and acellular pertussis (Tdap, Td) vaccine. You may need a Td booster every 10 years. Zoster vaccine. You may need this after age 44. Pneumococcal 13-valent conjugate (PCV13) vaccine. One  dose is recommended after age 88. Pneumococcal polysaccharide (PPSV23) vaccine. One dose is recommended after age 17. Talk to your health care provider about which screenings and vaccines you need and how often you need them. This information is not intended to replace advice given to you by your health care provider. Make sure you discuss any questions you have with your health care provider. Document Released: 09/10/2015 Document Revised: 05/03/2016 Document Reviewed: 06/15/2015 Elsevier Interactive Patient Education  2017 [place] Prevention in the Home Falls can cause injuries. They can happen to people of all ages. There are many things you can do to make your home safe and to help prevent falls. What can I do on the outside of my home? Regularly fix the edges of walkways and driveways and fix any cracks. Remove anything that might make you trip as you walk through a door, such as a raised step or threshold. Trim any bushes or trees on the path to your home. Use bright outdoor lighting. Clear any walking paths of anything that might make someone trip, such as rocks or tools. Regularly check to see if handrails are loose or broken. Make sure that both sides of any steps have handrails. Any raised decks and porches should have guardrails on the edges. Have any leaves, snow, or ice cleared regularly. Use sand or salt on walking paths during winter. Clean up any spills in your garage right away. This includes oil or grease spills. What can I do in the bathroom? Use night lights. Install grab bars by the toilet and in the tub and shower. Do not use towel bars as grab bars. Use non-skid mats or decals in the tub or shower. If you need to sit down in the shower, use a plastic, non-slip stool. Keep the floor dry. Clean up any water that spills on the floor as soon as it happens. Remove soap buildup in the tub or shower regularly. Attach bath mats securely with double-sided  non-slip rug tape. Do not have throw rugs and other things on the floor that can make you trip. What can I do in the bedroom? Use night lights. Make sure that you have a light by your bed that is easy to reach. Do not use any sheets or blankets that are too big for your bed. They should not hang down onto the floor. Have a firm chair that has side arms. You can use this for support while you get dressed. Do not have throw rugs and other things on the floor that can make you trip. What can I do in the kitchen? Clean up any spills right away. Avoid walking on wet floors. Keep items that you use a lot in easy-to-reach places. If you need to reach something above you, use a strong step stool that has a grab bar. Keep electrical cords out of the way. Do not use floor polish or wax that makes floors slippery. If you must use wax, use non-skid floor wax. Do not have throw rugs and other things on the floor that can make you trip. What can I do with my stairs? Do not leave any items on the stairs. Make sure that there are  handrails on both sides of the stairs and use them. Fix handrails that are broken or loose. Make sure that handrails are as long as the stairways. Check any carpeting to make sure that it is firmly attached to the stairs. Fix any carpet that is loose or worn. Avoid having throw rugs at the top or bottom of the stairs. If you do have throw rugs, attach them to the floor with carpet tape. Make sure that you have a light switch at the top of the stairs and the bottom of the stairs. If you do not have them, ask someone to add them for you. What else can I do to help prevent falls? Wear shoes that: Do not have high heels. Have rubber bottoms. Are comfortable and fit you well. Are closed at the toe. Do not wear sandals. If you use a stepladder: Make sure that it is fully opened. Do not climb a closed stepladder. Make sure that both sides of the stepladder are locked into place. Ask  someone to hold it for you, if possible. Clearly mark and make sure that you can see: Any grab bars or handrails. First and last steps. Where the edge of each step is. Use tools that help you move around (mobility aids) if they are needed. These include: Canes. Walkers. Scooters. Crutches. Turn on the lights when you go into a dark area. Replace any light bulbs as soon as they burn out. Set up your furniture so you have a clear path. Avoid moving your furniture around. If any of your floors are uneven, fix them. If there are any pets around you, be aware of where they are. Review your medicines with your doctor. Some medicines can make you feel dizzy. This can increase your chance of falling. Ask your doctor what other things that you can do to help prevent falls. This information is not intended to replace advice given to you by your health care provider. Make sure you discuss any questions you have with your health care provider. Document Released: 06/10/2009 Document Revised: 01/20/2016 Document Reviewed: 09/18/2014 Elsevier Interactive Patient Education  2017 [employer].

## 2022-11-14 ENCOUNTER — Encounter: Payer: Self-pay | Admitting: Family Medicine

## 2022-11-15 NOTE — Progress Notes (Addendum)
[facility] [address], [place], [place] [address] [phone]  Date:  11/16/2022   Name:  Carol [name]   DOB:  1927/11/17   MRN:  [id number]  PCP:  Darreld Mclean, MD    Chief Complaint: Altered Mental Status   History of Present Illness:  [name] is a 89 y.o. very pleasant female patient who presents with the following:  Carol [name] is seen today with concern of increased confusion- history of a fib on eliquis, hyperlipidemia, HTN, mild dementia, breast cancer, prior stroke, vitamin D deficiency  I last saw Carol [name] in [place] that time they had noticed some gradual worsening of her baseline confusion with some acute worsening.  Lab evaluation that time was benign, urine culture negative  Her sister-in-law [name] recently contacted me as follows: I wanted to let you know thatBettye walked out of [facility] last night telling some of the residents that she was going home to be with her husband whom she had not mentioned after she adjusted to the anxiety meds. This happened after dinner and after dark. She left with a book and her telephone receiver, without a coat. To make a long story short, a couple picked her up recognized that she needed help and brought her back to the [facility] . She left her room 2 more times. The person who oversees her meds etc gave her a second anxiety pill and she finally went to sleep. She was still asleep at 10. [facility] is an [employer] with no night coverage. This is our most dreaded nightmare. We had her over for dinner Sunday and she seemed her happy self. Do you have any suggestions from a medical position?   We made this appointment to again assess for worsening of confusion  They note her  left foot hurt when she got out of the car so she is in a WC today  However, patient notes her foot is not currently hurting.  She is able to get out of the wheelchair  and walk to the bathroom She is taking seroquel 50 mg at bedtime which has generally helped her to sleep more peacefully and be less restless at night. However, her current living situation does not have any supervision or controls at night.  Residents are completely free to come and go as they please.  With her worsening memory issues this may no longer be feasible for her  Today Carol [name] states she feels fine, she has no particular concerns.  She denies any pain or discomfort  Patient Active Problem List   Diagnosis Date Noted   Paroxysmal atrial fibrillation ([facility]) 08/13/2018   Right knee pain 11/16/2017   Osteopenia 11/12/2016   Mild cognitive disorder 10/10/2016   Chronic anticoagulation 10/04/2016   History of atrial fibrillation 10/04/2016   Essential hypertension, benign 09/01/2016   Paroxysmal supraventricular tachycardia 09/01/2016   Hyperlipidemia 09/01/2016    Past Medical History:  Diagnosis Date   Anemia    Blood transfusion without reported diagnosis    Breast cancer ([facility])    Cardiomyopathy ([facility])    Cataract    Dyslipidemia    Hyperlipidemia    Hypertension    Paroxysmal supraventricular tachycardia    Permanent atrial fibrillation ([facility])    Stroke [facility])     Past Surgical History:  Procedure Laterality Date   MASTECTOMY     TONSILLECTOMY     WRIST SURGERY  Social History   Tobacco Use   Smoking status: Never   Smokeless tobacco: Never  Vaping Use   Vaping Use: Never used  Substance Use Topics   Alcohol use: No   Drug use: No    Family History  Problem Relation Age of Onset   Epilepsy Mother    Stroke Father    Heart disease Maternal Aunt     Allergies  Allergen Reactions   Tramadol Other (See Comments)    seizure   Sertraline Other (See Comments)   Zoloft [Sertraline Hcl]     headache    Medication list has been reviewed and updated.  Current Outpatient Medications on File Prior to Visit  Medication Sig Dispense Refill   ABRYSVO  120 MCG/0.5ML injection      apixaban (ELIQUIS) 5 MG TABS tablet Take 1 tablet (5 mg total) by mouth 2 (two) times daily. 180 tablet 3   Cholecalciferol (VITAMIN D3) 1000 units CAPS Take by mouth.     losartan (COZAAR) 50 MG tablet Take 1 tablet (50 mg total) by mouth daily. 90 tablet 3   metoprolol succinate (TOPROL-XL) 25 MG 24 hr tablet Take 1 tablet (25 mg total) by mouth daily. 90 tablet 3   QUEtiapine (SEROQUEL) 25 MG tablet Take 1 tablet (25 mg total) by mouth at bedtime. Increase to BID as directed by MD 180 tablet 1   No current facility-administered medications on file prior to visit.    Review of Systems:  As per HPI- otherwise negative.   Physical Examination: Vitals:   11/16/22 1504  BP: (!) 142/60  Pulse: 60  Resp: 18  Temp: 97.6 F (36.4 C)  SpO2: 95%   Vitals:   There is no height or weight on file to calculate BMI. Ideal Body Weight:    GEN: no acute distress.  Looks well and her normal self.  She is gradually becoming a bit more confused with age 24: Atraumatic, Normocephalic.  Ears and Nose: No external deformity. CV: RRR, No M/G/R. No JVD. No thrill. No extra heart sounds. PULM: CTA B, no wheezes, crackles, rhonchi. No retractions. No resp. distress. No accessory muscle use. ABD: S, NT, ND EXTR: No c/c/e PSYCH: Normally interactive. Conversant.  Palpated both feet, no particular tenderness to suggest a fracture.  No swelling  Assessment and Plan: Confusion - Plan: CBC, Comprehensive metabolic panel, TSH, [id number], RPR, Urine Culture, DG Chest 2 View, POCT Urinalysis Dipstick  Left foot pain - Plan: DG Foot Complete Left  Microhematuria - Plan: Urine Microscopic Only  Patient seen today with concern of worsening of baseline confusion.  Will obtain blood work and chest x-ray as above to rule out a reversible cause.  If all is normal we might try Namenda or Aricept for her.  However, discussed with her sister-in-law that Dazzlyn may need either a private  sitter at least at nighttime or to move to a memory care facility-unfortunately she may continue to wander and put herself in danger.  [name] is aware  Will also obtain an x-ray of her foot to rule out a fracture  Signed Lamar Blinks, MD  Reviewed x-rays as below, message to patient We will try to add on a BNP for concern of possible interstitial edema.  Also consider an echocardiogram if the patient is interested. We can try a few days of diuretic depending on her labs which are pending  DG Chest 2 View  Result Date: 11/16/2022 CLINICAL DATA:  Provided history: Confusion.  Wondering.  EXAM: CHEST - 2 VIEW COMPARISON:  Chest radiographs 08/10/2022 and earlier. FINDINGS: Cardiomegaly. Aortic atherosclerosis. Subtle prominence of the interstitial lung markings, suspicious for mild interstitial edema. Small ill-defined/linear opacities within the mid left lung and right lung base, with an appearance favoring atelectasis and/or scarring. No evidence of pleural effusion or pneumothorax. No acute osseous abnormality identified. Surgical clips project in the region of the chest wall/axillae, bilaterally. IMPRESSION: 1. Cardiomegaly. 2. Subtle prominence of the interstitial lung markings suggesting mild interstitial edema. 3. Small ill-defined/linear opacities within the mid left lung and right lung base, with an appearance favoring atelectasis and/or scarring. 4.  Aortic Atherosclerosis (ICD10-I70.0). Electronically Signed   By: Kellie Simmering D.O.   On: 11/16/2022 16:47   DG Foot Complete Left  Result Date: 11/16/2022 CLINICAL DATA:  Left foot pain EXAM: LEFT FOOT - COMPLETE 3+ VIEW COMPARISON:  None Available. FINDINGS: There is no evidence of fracture or dislocation. Slight hallux valgus alignment. Moderate osteoarthritis of the first through third tarsometatarsal joints. Relatively mild joint space narrowing throughout the remainder of the foot. Small bidirectional calcaneal enthesophytes. Soft tissues  are unremarkable. 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 No acute findings. Electronically Signed   By: Davina Poke D.O.   On: 11/16/2022 16:44    Adddnd 3/23- received labs as below, message to pt  Results for orders placed or performed in visit on 11/16/22  Urine Culture   Specimen: Urine  Result Value Ref Range   MICRO NUMBER: [id number]    SPECIMEN QUALITY: Adequate    Sample Source URINE    STATUS: FINAL    Result:      Less than 10,000 CFU/mL of single Gram negative organism isolated. No further testing will be performed. If clinically indicated, recollection using a method to minimize contamination, with prompt transfer to Urine Culture Transport Tube, is recommended.  CBC  Result Value Ref Range   WBC 5.7 4.0 - 10.5 K/uL   RBC 4.26 3.87 - 5.11 Mil/uL   Platelets 227.0 150.0 - 400.0 K/uL   Hemoglobin 13.0 12.0 - 15.0 g/dL   HCT 38.6 36.0 - 46.0 %   MCV 90.8 78.0 - 100.0 fl   MCHC 33.5 30.0 - 36.0 g/dL   RDW 15.2 11.5 - 15.5 %  Comprehensive metabolic panel  Result Value Ref Range   Sodium 140 135 - 145 mEq/L   Potassium 4.3 3.5 - 5.1 mEq/L   Chloride 102 96 - 112 mEq/L   CO2 30 19 - 32 mEq/L   Glucose, Bld 100 (H) 70 - 99 mg/dL   BUN 12 6 - 23 mg/dL   Creatinine, Ser 0.79 0.40 - 1.20 mg/dL   Total Bilirubin 0.6 0.2 - 1.2 mg/dL   Alkaline Phosphatase 54 39 - 117 U/L   AST 22 0 - 37 U/L   ALT 11 0 - 35 U/L   Total Protein 7.3 6.0 - 8.3 g/dL   Albumin 4.3 3.5 - 5.2 g/dL   GFR 64.94 >60.00 mL/min   Calcium 9.7 8.4 - 10.5 mg/dL  TSH  Result Value Ref Range   TSH 1.97 0.35 - 5.50 uIU/mL  B12  Result Value Ref Range   Vitamin B-12 219 211 - 911 pg/mL  RPR  Result Value Ref Range   RPR Ser Ql NON-REACTIVE NON-REACTIVE  Urine Microscopic Only  Result Value Ref Range   WBC, UA 3-6/hpf (A) 0-2/hpf   RBC / HPF 0-2/hpf 0-2/hpf   Squamous Epithelial / HPF Rare(0-4/hpf) Rare(0-4/hpf)  Bacteria, UA Few(10-50/hpf) (A) None  POCT Urinalysis  Dipstick  Result Value Ref Range   Color, UA yellow    Clarity, UA cloudy    Glucose, UA Negative Negative   Bilirubin, UA negative    Ketones, UA negative    Spec Grav, UA <=1.005 (A) 1.010 - 1.025   Blood, UA Trace    pH, UA 6.0 5.0 - 8.0   Protein, UA Negative Negative   Urobilinogen, UA 0.2 0.2 or 1.0 E.U./dL   Nitrite, UA negative    Leukocytes, UA Negative Negative   Appearance cloudy    Odor none

## 2022-11-16 ENCOUNTER — Ambulatory Visit (HOSPITAL_BASED_OUTPATIENT_CLINIC_OR_DEPARTMENT_OTHER)
Admission: RE | Admit: 2022-11-16 | Discharge: 2022-11-16 | Disposition: A | Discharge status: Home / Self Care | Payer: Medicare HMO | Source: Ambulatory Visit | Attending: Family Medicine | Admitting: Family Medicine

## 2022-11-16 ENCOUNTER — Encounter: Payer: Self-pay | Admitting: Family Medicine

## 2022-11-16 ENCOUNTER — Ambulatory Visit (INDEPENDENT_AMBULATORY_CARE_PROVIDER_SITE_OTHER): Payer: Medicare HMO | Admitting: Family Medicine

## 2022-11-16 VITALS — BP 142/60 | HR 60 | Temp 97.6°F | Resp 18

## 2022-11-16 DIAGNOSIS — M79672 Pain in left foot: Secondary | ICD-10-CM | POA: Insufficient documentation

## 2022-11-16 DIAGNOSIS — R410 Disorientation, unspecified: Secondary | ICD-10-CM

## 2022-11-16 DIAGNOSIS — R3129 Other microscopic hematuria: Secondary | ICD-10-CM

## 2022-11-16 LAB — POCT URINALYSIS DIPSTICK
Bilirubin, UA: NEGATIVE
Glucose, UA: NEGATIVE
Ketones, UA: NEGATIVE
Leukocytes, UA: NEGATIVE
Nitrite, UA: NEGATIVE
Protein, UA: NEGATIVE
Spec Grav, UA: <=1.005 — AB (ref 1.010–1.025)
Urobilinogen, UA: 0.2 E.U./dL
pH, UA: 6 (ref 5.0–8.0)

## 2022-11-16 NOTE — Patient Instructions (Signed)
Good to see you today- please go to lab and then the ground floor imaging dept  If we don't find any explanation for your symptoms we can try a medication for dementia which may offer some benefit. However, I am afraid wandering may continue which may mean Carol [name] needs to move to a facility that offers memory care in the near future

## 2022-11-17 LAB — URINE CULTURE
MICRO NUMBER:: 14723734
SPECIMEN QUALITY:: ADEQUATE

## 2022-11-17 LAB — CBC
HCT: 38.6 % (ref 36.0–46.0)
Hemoglobin: 13 g/dL (ref 12.0–15.0)
MCHC: 33.5 g/dL (ref 30.0–36.0)
MCV: 90.8 fl (ref 78.0–100.0)
Platelets: 227 10*3/uL (ref 150.0–400.0)
RBC: 4.26 Mil/uL (ref 3.87–5.11)
RDW: 15.2 % (ref 11.5–15.5)
WBC: 5.7 10*3/uL (ref 4.0–10.5)

## 2022-11-17 LAB — URINALYSIS, MICROSCOPIC ONLY

## 2022-11-17 LAB — COMPREHENSIVE METABOLIC PANEL
ALT: 11 U/L (ref 0–35)
AST: 22 U/L (ref 0–37)
Albumin: 4.3 g/dL (ref 3.5–5.2)
Alkaline Phosphatase: 54 U/L (ref 39–117)
BUN: 12 mg/dL (ref 6–23)
CO2: 30 mEq/L (ref 19–32)
Calcium: 9.7 mg/dL (ref 8.4–10.5)
Chloride: 102 mEq/L (ref 96–112)
Creatinine, Ser: 0.79 mg/dL (ref 0.40–1.20)
GFR: 64.94 mL/min (ref >60.00)
Glucose, Bld: 100 mg/dL — ABNORMAL HIGH (ref 70–99)
Potassium: 4.3 mEq/L (ref 3.5–5.1)
Sodium: 140 mEq/L (ref 135–145)
Total Bilirubin: 0.6 mg/dL (ref 0.2–1.2)
Total Protein: 7.3 g/dL (ref 6.0–8.3)

## 2022-11-17 LAB — TSH: TSH: 1.97 u[IU]/mL (ref 0.35–5.50)

## 2022-11-17 LAB — VITAMIN B12: Vitamin B-12: 219 pg/mL (ref 211–911)

## 2022-11-17 LAB — RPR: RPR Ser Ql: NONREACTIVE

## 2022-11-18 ENCOUNTER — Encounter: Payer: Self-pay | Admitting: Family Medicine

## 2022-11-20 ENCOUNTER — Encounter: Payer: Self-pay | Admitting: Family Medicine

## 2022-11-20 ENCOUNTER — Encounter: Payer: Self-pay | Admitting: Cardiology

## 2022-11-20 MED ORDER — DONEPEZIL HCL 5 MG PO TBDP: 5.0000 mg | ORAL_TABLET | Freq: Every day | ORAL | 1 refills | Status: DC

## 2022-11-20 MED ORDER — FUROSEMIDE 20 MG PO TABS: 10.0000 mg | ORAL_TABLET | Freq: Every day | ORAL | 3 refills | Status: DC

## 2022-12-12 NOTE — Progress Notes (Unsigned)
[facility] [address] [place], [place] [address] [phone]  Date:  12/14/2022   Name:  Carol [name]   DOB:  1928-04-08   MRN:  [id number]  PCP:  Pearline Cables, MD    Chief Complaint: No chief complaint on file.   History of Present Illness:  Carol [name] is a 89 y.o. very pleasant female patient who presents with the following:  Patient seen today for periodic follow-up Most recent visit with myself was last month with concern of increased confusion history of a fib on eliquis, hyperlipidemia, HTN, mild dementia, breast cancer, prior stroke, vitamin D deficiency  Her brother and sister-in-law are her main support people At our visit last month there was concern that Carol [name] was becoming more confused, had left her assisted living facility by herself  Workup last month showed possible mild interstitial edema on chest film  Patient Active Problem List   Diagnosis Date Noted   Paroxysmal atrial fibrillation 08/13/2018   Right knee pain 11/16/2017   Osteopenia 11/12/2016   Mild cognitive disorder 10/10/2016   Chronic anticoagulation 10/04/2016   History of atrial fibrillation 10/04/2016   Essential hypertension, benign 09/01/2016   Paroxysmal supraventricular tachycardia 09/01/2016   Hyperlipidemia 09/01/2016    Past Medical History:  Diagnosis Date   Anemia    Blood transfusion without reported diagnosis    Breast cancer ([facility])    Cardiomyopathy ([facility])    Cataract    Dyslipidemia    Hyperlipidemia    Hypertension    Paroxysmal supraventricular tachycardia    Permanent atrial fibrillation ([facility])    Stroke ([facility])     Past Surgical History:  Procedure Laterality Date   MASTECTOMY     TONSILLECTOMY     WRIST SURGERY      Social History   Tobacco Use   Smoking status: Never   Smokeless tobacco: Never  Vaping Use   Vaping Use: Never used  Substance Use Topics   Alcohol use: No    Drug use: No    Family History  Problem Relation Age of Onset   Epilepsy Mother    Stroke Father    Heart disease Maternal Aunt     Allergies  Allergen Reactions   Tramadol Other (See Comments)    seizure   Sertraline Other (See Comments)   Zoloft [Sertraline Hcl]     headache    Medication list has been reviewed and updated.  Current Outpatient Medications on File Prior to Visit  Medication Sig Dispense Refill   ABRYSVO 120 MCG/0.5ML injection      apixaban (ELIQUIS) 5 MG TABS tablet Take 1 tablet (5 mg total) by mouth 2 (two) times daily. 180 tablet 3   Cholecalciferol (VITAMIN D3) 1000 units CAPS Take by mouth.     donepezil (ARICEPT ODT) 5 MG disintegrating tablet Take 1 tablet (5 mg total) by mouth at bedtime. 90 tablet 1   furosemide (LASIX) 20 MG tablet Take 0.5 tablets (10 mg total) by mouth daily. 30 tablet 3   losartan (COZAAR) 50 MG tablet Take 1 tablet (50 mg total) by mouth daily. 90 tablet 3   metoprolol succinate (TOPROL-XL) 25 MG 24 hr tablet Take 1 tablet (25 mg total) by mouth daily. 90 tablet 3   QUEtiapine (SEROQUEL) 25 MG tablet Take 1 tablet (25 mg total) by mouth at bedtime. Increase to BID as directed by MD 180 tablet 1  No current facility-administered medications on file prior to visit.    Review of Systems:  As per HPI- otherwise negative.   Physical Examination: There were no vitals filed for this visit. There were no vitals filed for this visit. There is no height or weight on file to calculate BMI. Ideal Body Weight:    GEN: no acute distress. HEENT: Atraumatic, Normocephalic.  Ears and Nose: No external deformity. CV: RRR, No M/G/R. No JVD. No thrill. No extra heart sounds. PULM: CTA B, no wheezes, crackles, rhonchi. No retractions. No resp. distress. No accessory muscle use. ABD: S, NT, ND, +BS. No rebound. No HSM. EXTR: No c/c/e PSYCH: Normally interactive. Conversant.    Assessment and Plan: ***  Signed Abbe Amsterdam,  MD

## 2022-12-14 ENCOUNTER — Encounter: Payer: Self-pay | Admitting: Family Medicine

## 2022-12-14 ENCOUNTER — Ambulatory Visit (INDEPENDENT_AMBULATORY_CARE_PROVIDER_SITE_OTHER): Payer: Medicare HMO | Admitting: Family Medicine

## 2022-12-14 ENCOUNTER — Ambulatory Visit
Admission: RE | Admit: 2022-12-14 | Discharge: 2022-12-14 | Disposition: A | Discharge status: Home / Self Care | Payer: Medicare HMO | Source: Ambulatory Visit | Attending: Family Medicine | Admitting: Family Medicine

## 2022-12-14 VITALS — BP 124/74 | HR 62 | Temp 98.0°F | Resp 18 | Ht 62.0 in | Wt 135.8 lb

## 2022-12-14 DIAGNOSIS — I071 Rheumatic tricuspid insufficiency: Secondary | ICD-10-CM | POA: Diagnosis not present

## 2022-12-14 DIAGNOSIS — R001 Bradycardia, unspecified: Secondary | ICD-10-CM | POA: Insufficient documentation

## 2022-12-14 DIAGNOSIS — J811 Chronic pulmonary edema: Secondary | ICD-10-CM

## 2022-12-14 DIAGNOSIS — Z7901 Long term (current) use of anticoagulants: Secondary | ICD-10-CM | POA: Diagnosis not present

## 2022-12-14 DIAGNOSIS — I340 Nonrheumatic mitral (valve) insufficiency: Secondary | ICD-10-CM | POA: Diagnosis not present

## 2022-12-14 DIAGNOSIS — I10 Essential (primary) hypertension: Secondary | ICD-10-CM | POA: Diagnosis not present

## 2022-12-14 DIAGNOSIS — S2231XA Fracture of one rib, right side, initial encounter for closed fracture: Secondary | ICD-10-CM | POA: Diagnosis not present

## 2022-12-14 DIAGNOSIS — R410 Disorientation, unspecified: Secondary | ICD-10-CM

## 2022-12-14 DIAGNOSIS — I371 Nonrheumatic pulmonary valve insufficiency: Secondary | ICD-10-CM | POA: Diagnosis not present

## 2022-12-14 DIAGNOSIS — I4891 Unspecified atrial fibrillation: Secondary | ICD-10-CM | POA: Diagnosis not present

## 2022-12-14 DIAGNOSIS — I4821 Permanent atrial fibrillation: Secondary | ICD-10-CM | POA: Diagnosis present

## 2022-12-14 DIAGNOSIS — E785 Hyperlipidemia, unspecified: Secondary | ICD-10-CM | POA: Diagnosis not present

## 2022-12-14 LAB — BASIC METABOLIC PANEL
BUN: 14 mg/dL (ref 6–23)
CO2: 30 mEq/L (ref 19–32)
Calcium: 9.5 mg/dL (ref 8.4–10.5)
Chloride: 100 mEq/L (ref 96–112)
Creatinine, Ser: 0.93 mg/dL (ref 0.40–1.20)
GFR: 53.37 mL/min — ABNORMAL LOW (ref >60.00)
Glucose, Bld: 98 mg/dL (ref 70–99)
Potassium: 3.6 mEq/L (ref 3.5–5.1)
Sodium: 139 mEq/L (ref 135–145)

## 2022-12-14 NOTE — Patient Instructions (Signed)
Good to see you again today- please go to lab and then to x-ray to have a chest film It looks like the furosemide is helping- we may plan to continue this longer term.  Let me see how your results look

## 2022-12-15 ENCOUNTER — Ambulatory Visit: Payer: Medicare HMO | Admitting: Physician Assistant

## 2022-12-15 ENCOUNTER — Encounter: Payer: Self-pay | Admitting: Physician Assistant

## 2022-12-15 VITALS — BP 138/62 | HR 57 | Ht 62.0 in | Wt 134.0 lb

## 2022-12-15 DIAGNOSIS — I071 Rheumatic tricuspid insufficiency: Secondary | ICD-10-CM

## 2022-12-15 DIAGNOSIS — I4891 Unspecified atrial fibrillation: Secondary | ICD-10-CM | POA: Diagnosis not present

## 2022-12-15 DIAGNOSIS — R001 Bradycardia, unspecified: Secondary | ICD-10-CM | POA: Diagnosis not present

## 2022-12-15 DIAGNOSIS — E785 Hyperlipidemia, unspecified: Secondary | ICD-10-CM

## 2022-12-15 DIAGNOSIS — I10 Essential (primary) hypertension: Secondary | ICD-10-CM | POA: Diagnosis not present

## 2022-12-15 DIAGNOSIS — I371 Nonrheumatic pulmonary valve insufficiency: Secondary | ICD-10-CM | POA: Diagnosis not present

## 2022-12-15 DIAGNOSIS — I340 Nonrheumatic mitral (valve) insufficiency: Secondary | ICD-10-CM | POA: Diagnosis not present

## 2022-12-15 DIAGNOSIS — I4821 Permanent atrial fibrillation: Secondary | ICD-10-CM

## 2022-12-15 DIAGNOSIS — Z7901 Long term (current) use of anticoagulants: Secondary | ICD-10-CM | POA: Diagnosis not present

## 2022-12-15 NOTE — Progress Notes (Signed)
Office Visit    Patient Name: Carol [name] Date of Encounter: 12/15/2022  PCP:  Pearline Cables, MD   [facility]  Cardiologist:  Donato Schultz, MD  Advanced Practice Provider:  No care team member to display Electrophysiologist:  None   HPI    Carol [name] is a 89 y.o. female with a past medical history of permanent atrial fibrillation, anemia, breast CA status post remote double mastectomy, dyslipidemia, hypertension, PSVT, stroke, dementia, bradycardia (requiring decrease in beta-blocker), mild cardiomyopathy (EF 50% in 2017), mild MR/TR/PR by echo 2017 presents today for follow-up appointment.  Per review of notes, she moved here from [place] several years ago.  She had a reported prior EF of 40% that improved to 50% after rate control.  Last scanned echocardiogram from 2017 showed LVEF 50%, LV function variable secondary to atrial fibrillation, mild LAE, moderate RAE, normal RV, mild TR, mild aortic sclerosis without stenosis, mild MR, mild pulmonary hypertension, mild TR, dilated IVC.  She is on anticoagulation and rate control.  It appears now that her atrial fibrillation is permanent.  She was seen by Ronie Spies, PA-C 06/08/2022 and was doing well at that time.  She lives at ALF.  She had not had any recent chest pain, palpitations, shortness of breath, bleeding, or syncope.  Today, she tells me she has been feel good without any chest pain or SOB. We discussed getting an updated lipid panel but her PCP just got lab work yesterday and she has a bid bruise on her arm. For this reason, we agreed it would be fine to wait until her 86-month follow-up with her PCP to get an updated lipid panel.  We also discussed updating an echocardiogram since the last time we checked her heart valves was in 2017.  Reports no shortness of breath nor dyspnea on exertion. Reports no chest pain, pressure, or tightness. No edema, orthopnea, PND. Reports no palpitations.     Past Medical History    Past Medical History:  Diagnosis Date   Anemia    Blood transfusion without reported diagnosis    Breast cancer    Cardiomyopathy    Cataract    Dyslipidemia    Hyperlipidemia    Hypertension    Paroxysmal supraventricular tachycardia    Permanent atrial fibrillation    Stroke    Past Surgical History:  Procedure Laterality Date   MASTECTOMY     TONSILLECTOMY     WRIST SURGERY      Allergies  Allergies  Allergen Reactions   Tramadol Other (See Comments)    seizure   Sertraline Other (See Comments)   Zoloft [Sertraline Hcl]     headache    EKGs/Labs/Other Studies Reviewed:   The following studies were reviewed today: Scanned in results above  EKG:  EKG is not ordered today.   Recent Labs: 11/16/2022: ALT 11; Hemoglobin 13.0; Platelets 227.0; TSH 1.97 12/14/2022: BUN 14; Creatinine, Ser 0.93; Potassium 3.6; Sodium 139  Recent Lipid Panel    Component Value Date/Time   CHOL 203 (H) 08/19/2018 1350   CHOL 182 09/12/2016 0943   TRIG 100.0 08/19/2018 1350   HDL 69.10 08/19/2018 1350   HDL 81 09/12/2016 0943   CHOLHDL 3 08/19/2018 1350   VLDL 20.0 08/19/2018 1350   LDLCALC 114 (H) 08/19/2018 1350   LDLCALC 87 09/12/2016 0943    Risk Assessment/Calculations:   CHA2DS2-VASc Score = 7   This indicates a 11.2% annual risk of stroke.  The patient's score is based upon: CHF History: 1 HTN History: 1 Diabetes History: 0 Stroke History: 2 Vascular Disease History: 0 Age Score: 2 Gender Score: 1     Home Medications   Current Meds  Medication Sig   ABRYSVO 120 MCG/0.5ML injection    apixaban (ELIQUIS) 5 MG TABS tablet Take 1 tablet (5 mg total) by mouth 2 (two) times daily.   Cholecalciferol (VITAMIN D3) 1000 units CAPS Take by mouth.   donepezil (ARICEPT ODT) 5 MG disintegrating tablet Take 1 tablet (5 mg total) by mouth at bedtime.   furosemide (LASIX) 20 MG tablet Take 0.5 tablets (10 mg total) by mouth daily.   losartan  (COZAAR) 50 MG tablet Take 1 tablet (50 mg total) by mouth daily.   metoprolol succinate (TOPROL-XL) 25 MG 24 hr tablet Take 1 tablet (25 mg total) by mouth daily.   QUEtiapine (SEROQUEL) 25 MG tablet Take 1 tablet (25 mg total) by mouth at bedtime. Increase to BID as directed by MD     Review of Systems      All other systems reviewed and are otherwise negative except as noted above.  Physical Exam    VS:  BP 138/62   Pulse (!) 57   Ht  (1.575 m)   Wt 134 lb (60.8 kg)   LMP  (LMP Unknown)   SpO2 95%   BMI 24.51 kg/m  , BMI Body mass index is 24.51 kg/m.  Wt Readings from Last 3 Encounters:  12/15/22 134 lb (60.8 kg)  12/14/22 135 lb 12.8 oz (61.6 kg)  10/04/22 139 lb (63 kg)     GEN: Well nourished, well developed, in no acute distress. HEENT: normal. Neck: Supple, no JVD, carotid bruits, or masses. Cardiac: RRR, no murmurs, rubs, or gallops. No clubbing, cyanosis, edema.  Radials/PT 2+ and equal bilaterally.  Respiratory:  Respirations regular and unlabored, clear to auscultation bilaterally. GI: Soft, nontender, nondistended. MS: No deformity or atrophy. Skin: Warm and dry, no rash. Neuro:  Strength and sensation are intact. Psych: Normal affect.  Assessment & Plan    Permanent atrial fibrillation -suprisingly in NSR today -would continue current medications: Eliquis 5 mg twice a day, Lasix 10 mg daily, losartan 50 mg daily, metoprolol succinate 25 mg daily  Dyslipidemia -lipid panel oct with PCP -no recent labs   Essential hypertension -138/62 -Continue current medication regimen -Continue to monitor blood pressure at home  Mild cardiomyopathy, mild MR/TR/PR -echo update  -stable from a symptom standpoint       Disposition: Follow up 6 months with Donato Schultz, MD or APP.  Signed, Sharlene Dory, PA-C 12/15/2022, 4:35 PM [facility]

## 2022-12-15 NOTE — Patient Instructions (Addendum)
Medication Instructions:   Your physician recommends that you continue on your current medications as directed. Please refer to the Current Medication list given to you today.   *If you need a refill on your cardiac medications before your next appointment, please call your pharmacy*   Lab Work:  IN OCTOBER GET LABS FROM PRIMARY LFT AND LIPIDS   If you have labs (blood work) drawn today and your tests are completely normal, you will receive your results only by: MyChart Message (if you have MyChart) OR A paper copy in the mail If you have any lab test that is abnormal or we need to change your treatment, we will call you to review the results.   Testing/Procedures: Your physician has requested that you have an echocardiogram. Echocardiography is a painless test that uses sound waves to create images of your heart. It provides your doctor with information about the size and shape of your heart and how well your heart's chambers and valves are working. This procedure takes approximately one hour. There are no restrictions for this procedure. Please do NOT wear cologne, perfume, aftershave, or lotions (deodorant is allowed). Please arrive 15 minutes prior to your appointment time.    Follow-Up: At [facility], you and your health needs are our priority.  As part of our continuing mission to provide you with exceptional heart care, we have created designated Provider Care Teams.  These Care Teams include your primary Cardiologist (physician) and Advanced Practice Providers (APPs -  Physician Assistants and Nurse Practitioners) who all work together to provide you with the care you need, when you need it.  We recommend signing up for the patient portal called "MyChart".  Sign up information is provided on this After Visit Summary.  MyChart is used to connect with patients for Virtual Visits (Telemedicine).  Patients are able to view lab/test results, encounter notes, upcoming  appointments, etc.  Non-urgent messages can be sent to your provider as well.   To learn more about what you can do with MyChart, go to [email or web address].     Your next appointment:    6 month(s)  Provider:   Donato Schultz, MD     Other Instructions  Low-Sodium Eating Plan Sodium, which is an element that makes up salt, helps you maintain a healthy balance of fluids in your body. Too much sodium can increase your blood pressure and cause fluid and waste to be held in your body. Your health care provider or dietitian may recommend following this plan if you have high blood pressure (hypertension), kidney disease, liver disease, or heart failure. Eating less sodium can help lower your blood pressure, reduce swelling, and protect your heart, liver, and kidneys. What are tips for following this plan? Reading food labels The Nutrition Facts label lists the amount of sodium in one serving of the food. If you eat more than one serving, you must multiply the listed amount of sodium by the number of servings. Choose foods with less than 140 mg of sodium per serving. Avoid foods with 300 mg of sodium or more per serving. Shopping  Look for lower-sodium products, often labeled as "low-sodium" or "no salt added." Always check the sodium content, even if foods are labeled as "unsalted" or "no salt added." Buy fresh foods. Avoid canned foods and pre-made or frozen meals. Avoid canned, cured, or processed meats. Buy breads that have less than 80 mg of sodium per slice. Cooking  Eat more home-cooked food and less restaurant, buffet,  and fast food. Avoid adding salt when cooking. Use salt-free seasonings or herbs instead of table salt or sea salt. Check with your health care provider or pharmacist before using salt substitutes. Cook with plant-based oils, such as canola, sunflower, or olive oil. Meal planning When eating at a restaurant, ask that your food be prepared with less salt or no salt,  if possible. Avoid dishes labeled as brined, pickled, cured, smoked, or made with soy sauce, miso, or teriyaki sauce. Avoid foods that contain MSG (monosodium glutamate). MSG is sometimes added to [place] food, bouillon, and some canned foods. Make meals that can be grilled, baked, poached, roasted, or steamed. These are generally made with less sodium. General information Most people on this plan should limit their sodium intake to 1,500-2,000 mg (milligrams) of sodium each day. What foods should I eat? Fruits Fresh, frozen, or canned fruit. Fruit juice. Vegetables Fresh or frozen vegetables. "No salt added" canned vegetables. "No salt added" tomato sauce and paste. Low-sodium or reduced-sodium tomato and vegetable juice. Grains Low-sodium cereals, including oats, puffed wheat and rice, and shredded wheat. Low-sodium crackers. Unsalted rice. Unsalted pasta. Low-sodium bread. Whole-grain breads and whole-grain pasta. Meats and other proteins Fresh or frozen (no salt added) meat, poultry, seafood, and fish. Low-sodium canned tuna and salmon. Unsalted nuts. Dried peas, beans, and lentils without added salt. Unsalted canned beans. Eggs. Unsalted nut butters. Dairy Milk. Soy milk. Cheese that is naturally low in sodium, such as ricotta cheese, fresh mozzarella, or Swiss cheese. Low-sodium or reduced-sodium cheese. Cream cheese. Yogurt. Seasonings and condiments Fresh and dried herbs and spices. Salt-free seasonings. Low-sodium mustard and ketchup. Sodium-free salad dressing. Sodium-free light mayonnaise. Fresh or refrigerated horseradish. Lemon juice. Vinegar. Other foods Homemade, reduced-sodium, or low-sodium soups. Unsalted popcorn and pretzels. Low-salt or salt-free chips. The items listed above may not be a complete list of foods and beverages you can eat. Contact a dietitian for more information. What foods should I avoid? Vegetables Sauerkraut, pickled vegetables, and relishes. Olives.  [place] fries. Onion rings. Regular canned vegetables (not low-sodium or reduced-sodium). Regular canned tomato sauce and paste (not low-sodium or reduced-sodium). Regular tomato and vegetable juice (not low-sodium or reduced-sodium). Frozen vegetables in sauces. Grains Instant hot cereals. Bread stuffing, pancake, and biscuit mixes. Croutons. Seasoned rice or pasta mixes. Noodle soup cups. Boxed or frozen macaroni and cheese. Regular salted crackers. Self-rising flour. Meats and other proteins Meat or fish that is salted, canned, smoked, spiced, or pickled. Precooked or cured meat, such as sausages or meat loaves. Tomasa Blase. Ham. Pepperoni. Hot dogs. Corned beef. Chipped beef. Salt pork. Jerky. Pickled herring. Anchovies and sardines. Regular canned tuna. Salted nuts. Dairy Processed cheese and cheese spreads. Hard cheeses. Cheese curds. Blue cheese. Feta cheese. String cheese. Regular cottage cheese. Buttermilk. Canned milk. Fats and oils Salted butter. Regular margarine. Ghee. Bacon fat. Seasonings and condiments Onion salt, garlic salt, seasoned salt, table salt, and sea salt. Canned and packaged gravies. Worcestershire sauce. Tartar sauce. Barbecue sauce. Teriyaki sauce. Soy sauce, including reduced-sodium. Steak sauce. Fish sauce. Oyster sauce. Cocktail sauce. Horseradish that you find on the shelf. Regular ketchup and mustard. Meat flavorings and tenderizers. Bouillon cubes. Hot sauce. Pre-made or packaged marinades. Pre-made or packaged taco seasonings. Relishes. Regular salad dressings. Salsa. Other foods Salted popcorn and pretzels. Corn chips and puffs. Potato and tortilla chips. Canned or dried soups. Pizza. Frozen entrees and pot pies. The items listed above may not be a complete list of foods and beverages you should avoid. Contact  a dietitian for more information. Summary Eating less sodium can help lower your blood pressure, reduce swelling, and protect your heart, liver, and kidneys. Most  people on this plan should limit their sodium intake to 1,500-2,000 mg (milligrams) of sodium each day. Canned, boxed, and frozen foods are high in sodium. Restaurant foods, fast foods, and pizza are also very high in sodium. You also get sodium by adding salt to food. Try to cook at home, eat more fresh fruits and vegetables, and eat less fast food and canned, processed, or prepared foods. This information is not intended to replace advice given to you by your health care provider. Make sure you discuss any questions you have with your health care provider. Document Revised: 07/21/2019 Document Reviewed: 07/16/2019 Elsevier Patient Education  2023 Elsevier Inc. Heart-Healthy Eating Plan Eating a healthy diet is important for the health of your heart. A heart-healthy eating plan includes: Eating less unhealthy fats. Eating more healthy fats. Eating less salt in your food. Salt is also called sodium. Making other changes in your diet. Talk with your doctor or a diet specialist (dietitian) to create an eating plan that is right for you. What is my plan? Your doctor may recommend an eating plan that includes: Total fat: ______% or less of total calories a day. Saturated fat: ______% or less of total calories a day. Cholesterol: less than _________mg a day. Sodium: less than _________mg a day. What are tips for following this plan? Cooking Avoid frying your food. Try to bake, boil, grill, or broil it instead. You can also reduce fat by: Removing the skin from poultry. Removing all visible fats from meats. Steaming vegetables in water or broth. Meal planning  At meals, divide your plate into four equal parts: Fill one-half of your plate with vegetables and green salads. Fill one-fourth of your plate with whole grains. Fill one-fourth of your plate with lean protein foods. Eat 2-4 cups of vegetables per day. One cup of vegetables is: 1 cup (91 g) broccoli or cauliflower florets. 2 medium  carrots. 1 large bell pepper. 1 large sweet potato. 1 large tomato. 1 medium white potato. 2 cups (150 g) raw leafy greens. Eat 1-2 cups of fruit per day. One cup of fruit is: 1 small apple 1 large banana 1 cup (237 g) mixed fruit, 1 large orange,  cup (82 g) dried fruit, 1 cup (240 mL) 100% fruit juice. Eat more foods that have soluble fiber. These are apples, broccoli, carrots, beans, peas, and barley. Try to get 20-30 g of fiber per day. Eat 4-5 servings of nuts, legumes, and seeds per week: 1 serving of dried beans or legumes equals  cup (90 g) cooked. 1 serving of nuts is  oz (12 almonds, 24 pistachios, or 7 walnut halves). 1 serving of seeds equals  oz (8 g). General information Eat more home-cooked food. Eat less restaurant, buffet, and fast food. Limit or avoid alcohol. Limit foods that are high in starch and sugar. Avoid fried foods. Lose weight if you are overweight. Keep track of how much salt (sodium) you eat. This is important if you have high blood pressure. Ask your doctor to tell you more about this. Try to add vegetarian meals each week. Fats Choose healthy fats. These include olive oil and canola oil, flaxseeds, walnuts, almonds, and seeds. Eat more omega-3 fats. These include salmon, mackerel, sardines, tuna, flaxseed oil, and ground flaxseeds. Try to eat fish at least 2 times each week. Check food labels.  Avoid foods with trans fats or high amounts of saturated fat. Limit saturated fats. These are often found in animal products, such as meats, butter, and cream. These are also found in plant foods, such as palm oil, palm kernel oil, and coconut oil. Avoid foods with partially hydrogenated oils in them. These have trans fats. Examples are stick margarine, some tub margarines, cookies, crackers, and other baked goods. What foods should I eat? Fruits All fresh, canned (in natural juice), or frozen fruits. Vegetables Fresh or frozen vegetables (raw,  steamed, roasted, or grilled). Green salads. Grains Most grains. Choose whole wheat and whole grains most of the time. Rice and pasta, including brown rice and pastas made with whole wheat. Meats and other proteins Lean, well-trimmed beef, veal, pork, and lamb. Chicken and [place] without skin. All fish and shellfish. Wild duck, rabbit, pheasant, and venison. Egg whites or low-cholesterol egg substitutes. Dried beans, peas, lentils, and tofu. Seeds and most nuts. Dairy Low-fat or nonfat cheeses, including ricotta and mozzarella. Skim or 1% milk that is liquid, powdered, or evaporated. Buttermilk that is made with low-fat milk. Nonfat or low-fat yogurt. Fats and oils Non-hydrogenated (trans-free) margarines. Vegetable oils, including soybean, sesame, sunflower, olive, peanut, safflower, corn, canola, and cottonseed. Salad dressings or mayonnaise made with a vegetable oil. Beverages Mineral water. Coffee and tea. Diet carbonated beverages. Sweets and desserts Sherbet, gelatin, and fruit ice. Small amounts of dark chocolate. Limit all sweets and desserts. Seasonings and condiments All seasonings and condiments. The items listed above may not be a complete list of foods and drinks you can eat. Contact a dietitian for more options. What foods should I avoid? Fruits Canned fruit in heavy syrup. Fruit in cream or butter sauce. Fried fruit. Limit coconut. Vegetables Vegetables cooked in cheese, cream, or butter sauce. Fried vegetables. Grains Breads that are made with saturated or trans fats, oils, or whole milk. Croissants. Sweet rolls. Donuts. High-fat crackers, such as cheese crackers. Meats and other proteins Fatty meats, such as hot dogs, ribs, sausage, bacon, rib-eye roast or steak. High-fat deli meats, such as salami and bologna. Caviar. Domestic duck and goose. Organ meats, such as liver. Dairy Cream, sour cream, cream cheese, and creamed cottage cheese. Whole-milk cheeses. Whole or 2% milk  that is liquid, evaporated, or condensed. Whole buttermilk. Cream sauce or high-fat cheese sauce. Yogurt that is made from whole milk. Fats and oils Meat fat, or shortening. Cocoa butter, hydrogenated oils, palm oil, coconut oil, palm kernel oil. Solid fats and shortenings, including bacon fat, salt pork, lard, and butter. Nondairy cream substitutes. Salad dressings with cheese or sour cream. Beverages Regular sodas and juice drinks with added sugar. Sweets and desserts Frosting. Pudding. Cookies. Cakes. Pies. Milk chocolate or white chocolate. Buttered syrups. Full-fat ice cream or ice cream drinks. The items listed above may not be a complete list of foods and drinks to avoid. Contact a dietitian for more information. Summary Heart-healthy meal planning includes eating less unhealthy fats, eating more healthy fats, and making other changes in your diet. Eat a balanced diet. This includes fruits and vegetables, low-fat or nonfat dairy, lean protein, nuts and legumes, whole grains, and heart-healthy oils and fats. This information is not intended to replace advice given to you by your health care provider. Make sure you discuss any questions you have with your health care provider. Document Revised: 09/19/2021 Document Reviewed: 09/19/2021 Elsevier Patient Education  2023 [employer].

## 2023-01-11 ENCOUNTER — Ambulatory Visit (HOSPITAL_COMMUNITY): Payer: Medicare HMO | Attending: Physician Assistant

## 2023-01-11 DIAGNOSIS — I340 Nonrheumatic mitral (valve) insufficiency: Secondary | ICD-10-CM | POA: Diagnosis not present

## 2023-01-11 LAB — ECHOCARDIOGRAM COMPLETE
Area-P 1/2: 6.63 cm2
MV M vel: 4.56 m/s
MV Peak grad: 83 mmHg
P 1/2 time: 262 msec
S' Lateral: 3 cm

## 2023-01-16 MED ORDER — FUROSEMIDE 20 MG PO TABS: 10.0000 mg | ORAL_TABLET | Freq: Every day | ORAL | 3 refills | Status: DC

## 2023-01-16 NOTE — Addendum Note (Signed)
Addended by: Abbe Amsterdam C on: 01/16/2023 05:53 AM   Modules accepted: Orders

## 2023-01-23 DIAGNOSIS — I959 Hypotension, unspecified: Secondary | ICD-10-CM | POA: Diagnosis not present

## 2023-01-23 DIAGNOSIS — I10 Essential (primary) hypertension: Secondary | ICD-10-CM | POA: Diagnosis not present

## 2023-01-23 DIAGNOSIS — R110 Nausea: Secondary | ICD-10-CM | POA: Diagnosis not present

## 2023-01-23 DIAGNOSIS — R509 Fever, unspecified: Secondary | ICD-10-CM | POA: Diagnosis not present

## 2023-01-23 DIAGNOSIS — F03A0 Unspecified dementia, mild, without behavioral disturbance, psychotic disturbance, mood disturbance, and anxiety: Secondary | ICD-10-CM | POA: Diagnosis not present

## 2023-01-23 DIAGNOSIS — R7881 Bacteremia: Secondary | ICD-10-CM | POA: Diagnosis not present

## 2023-01-23 DIAGNOSIS — R9431 Abnormal electrocardiogram [ECG] [EKG]: Secondary | ICD-10-CM | POA: Diagnosis not present

## 2023-01-23 DIAGNOSIS — R1310 Dysphagia, unspecified: Secondary | ICD-10-CM | POA: Diagnosis not present

## 2023-01-23 DIAGNOSIS — J189 Pneumonia, unspecified organism: Secondary | ICD-10-CM | POA: Diagnosis not present

## 2023-01-23 DIAGNOSIS — Z20822 Contact with and (suspected) exposure to covid-19: Secondary | ICD-10-CM | POA: Diagnosis not present

## 2023-01-23 DIAGNOSIS — Z853 Personal history of malignant neoplasm of breast: Secondary | ICD-10-CM | POA: Diagnosis not present

## 2023-01-23 DIAGNOSIS — R1111 Vomiting without nausea: Secondary | ICD-10-CM | POA: Diagnosis not present

## 2023-01-23 DIAGNOSIS — I890 Lymphedema, not elsewhere classified: Secondary | ICD-10-CM | POA: Diagnosis not present

## 2023-01-23 DIAGNOSIS — Z7901 Long term (current) use of anticoagulants: Secondary | ICD-10-CM | POA: Diagnosis not present

## 2023-01-23 DIAGNOSIS — I871 Compression of vein: Secondary | ICD-10-CM | POA: Diagnosis not present

## 2023-01-23 DIAGNOSIS — J9601 Acute respiratory failure with hypoxia: Secondary | ICD-10-CM | POA: Diagnosis not present

## 2023-01-23 DIAGNOSIS — R059 Cough, unspecified: Secondary | ICD-10-CM | POA: Diagnosis not present

## 2023-01-23 DIAGNOSIS — R0902 Hypoxemia: Secondary | ICD-10-CM | POA: Diagnosis not present

## 2023-01-23 DIAGNOSIS — I4891 Unspecified atrial fibrillation: Secondary | ICD-10-CM | POA: Diagnosis not present

## 2023-01-23 DIAGNOSIS — L03114 Cellulitis of left upper limb: Secondary | ICD-10-CM | POA: Diagnosis not present

## 2023-01-24 DIAGNOSIS — R1310 Dysphagia, unspecified: Secondary | ICD-10-CM | POA: Diagnosis not present

## 2023-01-24 DIAGNOSIS — J9601 Acute respiratory failure with hypoxia: Secondary | ICD-10-CM | POA: Diagnosis not present

## 2023-01-24 DIAGNOSIS — R9431 Abnormal electrocardiogram [ECG] [EKG]: Secondary | ICD-10-CM | POA: Diagnosis not present

## 2023-01-25 DIAGNOSIS — J9601 Acute respiratory failure with hypoxia: Secondary | ICD-10-CM | POA: Diagnosis not present

## 2023-01-25 LAB — COMPREHENSIVE METABOLIC PANEL
Calcium: 8.9 (ref 8.7–10.7)
eGFR: 82

## 2023-01-25 LAB — BASIC METABOLIC PANEL
BUN: 15 (ref 4–21)
CO2: 27 — AB (ref 13–22)
Chloride: 100 (ref 99–108)
Creatinine: 0.7 (ref 0.5–1.1)
Glucose: 107
Potassium: 4.3 mEq/L (ref 3.5–5.1)
Sodium: 135 — AB (ref 137–147)

## 2023-01-25 LAB — CBC AND DIFFERENTIAL
HCT: 38 (ref 36–46)
Hemoglobin: 12.8 (ref 12.0–16.0)
Platelets: 164 10*3/uL (ref 150–400)
WBC: 8.9

## 2023-01-26 ENCOUNTER — Telehealth: Payer: Self-pay | Admitting: *Deleted

## 2023-01-26 ENCOUNTER — Encounter: Payer: Self-pay | Admitting: *Deleted

## 2023-01-26 NOTE — Transitions of Care (Post Inpatient/ED Visit) (Signed)
01/26/2023  Name: Carol [name] MRN: [id number] DOB: 1928/04/19  Today's TOC FU Call Status: Today's TOC FU Call Status:: Successful TOC FU Call Competed TOC FU Call Complete Date: 01/26/23  Transition Care Management Follow-up Telephone Call Date of Discharge: 01/25/23 Discharge Facility: Other ([facility]) Name of Other ([facility]) Discharge Facility: [facility] Type of Discharge: Inpatient Admission Primary Inpatient Discharge Diagnosis:: CAP; acute on chronic respiratory failure with hypoxia How have you been since you were released from the hospital?: Better (per brother: "She is doing better; she lives at the ILF and has a caregiver that stays with her and checks on her regularly.  I got a report this morning that she is doing well.  Thanks for getting this appointment with Dr. Patsy Lager scheduled") Any questions or concerns?: No  Items Reviewed: Did you receive and understand the discharge instructions provided?: Yes (briefly reviewed with caregiver who verbalizes good understanding of same- outside hospital AVS) Medications obtained,verified, and reconciled?: Yes (Medications Reviewed) (Partial medication review completed; confirmed patient obtained/ is taking all newly Rx'd medications as instructed; ILF caregiver-manages medications; denies questions/ concerns around medications today) Any new allergies since your discharge?: No Dietary orders reviewed?: Yes Type of Diet Ordered:: "Regular" Do you have support at home?: Yes People in Home: alone Name of Support/Comfort Primary Source: Reports independent in self-care activities; resides at ILF and has regular paid caregiver; supportive family/ brother assists as/ if needed/ indicated  Medications Reviewed Today: Medications Reviewed Today     Reviewed by Michaela Corner, RN (Registered Nurse) on 01/26/23 at 1027  Med List Status: <None>   Medication Order Taking? Sig Documenting Provider Last Dose Status  Informant  ABRYSVO 120 MCG/0.5ML injection [id number]   [provider]  Active   amoxicillin-clavulanate (AUGMENTIN XR) 1000-62.5 MG 12 hr tablet [id number] Yes Take 2 tablets by mouth 2 (two) times daily. Copland, Gwenlyn Found, MD Taking Active Family Member           Med Note Michaela Corner   Fri Jan 26, 2023 10:27 AM) 01/26/23: brother verified during [facility] call that patient obtained and is taking post-hospital discharge from [facility] on 01/25/23-- reports to take for 7 days  apixaban (ELIQUIS) 5 MG TABS tablet [id number]  Take 1 tablet (5 mg total) by mouth 2 (two) times daily. Laurann Montana, PA-C  Active   Cholecalciferol (VITAMIN D3) 1000 units CAPS [id number]  Take by mouth. [provider]  Active   donepezil (ARICEPT ODT) 5 MG disintegrating tablet [id number]  Take 1 tablet (5 mg total) by mouth at bedtime. Copland, Gwenlyn Found, MD  Active   furosemide (LASIX) 20 MG tablet [id number]  Take 0.5 tablets (10 mg total) by mouth daily. Copland, Gwenlyn Found, MD  Active   losartan (COZAAR) 50 MG tablet [id number]  Take 1 tablet (50 mg total) by mouth daily. Laurann Montana, PA-C  Active   metoprolol succinate (TOPROL-XL) 25 MG 24 hr tablet [id number]  Take 1 tablet (25 mg total) by mouth daily. Laurann Montana, PA-C  Active   QUEtiapine (SEROQUEL) 25 MG tablet [id number]  Take 1 tablet (25 mg total) by mouth at bedtime. Increase to BID as directed by MD Copland, Gwenlyn Found, MD  Active             Home Care and Equipment/Supplies: Were Home Health Services Ordered?: No Any new equipment or medical supplies ordered?: No  Functional Questionnaire: Do you need assistance with bathing/showering or dressing?: Yes (Caregiver  at ILF assists with al care needs as indicated) Do you need assistance with meal preparation?: Yes (ILF provides all meals) Do you need assistance with eating?: No Do you have difficulty maintaining continence: No Do you need assistance with getting out of bed/getting out of a  chair/moving?: No (Caregiver at ILF assists with al care needs as indicated) Do you have difficulty managing or taking your medications?: Yes (Caregiver at ILF assists with al care needs as indicated)  Follow up appointments reviewed: PCP Follow-up appointment confirmed?: Yes (care coordination outreach in real-time with scheduling care guide to successfully schedule hospital follow up PCP appointment 02/05/23) Date of PCP follow-up appointment?: 02/05/23 (verified with scheduling care guide this is first available appointment for provider) Follow-up Provider: PCP, [facility] Follow-up appointment confirmed?: NA (verified not indicated per hospital discharging provider discharge notes) Do you need transportation to your follow-up appointment?: No Do you understand care options if your condition(s) worsen?: Yes-patient verbalized understanding  SDOH Interventions Today    Flowsheet Row Most Recent Value  SDOH Interventions   Food Insecurity Interventions Intervention Not Indicated  [ILF provides all meals]  Transportation Interventions Intervention Not Indicated  [brother provides all transportation]      TOC Interventions Today    Flowsheet Row Most Recent Value  TOC Interventions   TOC Interventions Discussed/Reviewed TOC Interventions Discussed, Arranged PCP follow up less than 12 days/Care Guide scheduled      Interventions Today    Flowsheet Row Most Recent Value  Chronic Disease   Chronic disease during today's visit Chronic Obstructive Pulmonary Disease (COPD), Other  [CAP,  acute on chronic respiratory failure with hypoxia]  General Interventions   General Interventions Discussed/Reviewed General Interventions Discussed, Doctor Visits, Level of Care, Durable Medical Equipment (DME)  Doctor Visits Discussed/Reviewed Doctor Visits Discussed, PCP  Durable Medical Equipment (DME) Other  [confirmed with caregiver that patient does not currently require use of  assistive devices]  PCP/Specialist Visits Compliance with follow-up visit  Level of Care --  [ILF]  Nutrition Interventions   Nutrition Discussed/Reviewed Nutrition Discussed  Pharmacy Interventions   Pharmacy Dicussed/Reviewed Pharmacy Topics Discussed      Caryl Pina, RN, BSN, CCRN Alumnus RN CM Care Coordination/ Transition of Care- [facility] Care Management [phone]: direct office

## 2023-01-28 NOTE — Progress Notes (Deleted)
[facility] [address], [address] [place], [place] [address] [phone]  Date:  01/31/2023   Name:  Carol [name]   DOB:  09/25/1927   MRN:  [id number]  PCP:  Pearline Cables, MD    Chief Complaint: No chief complaint on file.   History of Present Illness:  Carol [name] is a 89 y.o. very pleasant female patient who presents with the following:  Pt seen today for hospital follow-up  Last seen by myself about 6 weeks ago- my notes from that time: Most recent visit with myself was last month with concern of increased confusion history of a fib on eliquis, hyperlipidemia, HTN, mild dementia, breast cancer, prior stroke, vitamin D deficiency. Her brother and sister-in-law are her main support people At our visit last month there was concern that Inayah was becoming more confused, had left her assisted living facility by herself- [facility].  Thankfully she was intercepted no harm came to her.  We did add Aricept to her regimen after this incident, her brother does feel like it is helping her.  He notes she is calmer with this medication  She was admitted 5/28- 5/30 at [facility]: Admission Diagnoses: Acute respiratory failure with hypoxia (CMS/HCC) [J96.01] Discharge Diagnoses: Community-acquired pneumonia Acute hypoxic respiratory insufficiency Left upper extremity cellulitis Hospital Course: For full details, please see H&P, progress notes, consult notes and ancillary notes. Briefly, Carol [name] is a 89 year old female with history of mild dementia, A-fib on Eliquis, remote breast cancer who presented from independent care facility for nausea/vomiting found to have new oxygen requirement secondary to CAP. #Community-acquired pneumonia #Acute hypoxic respiratory insufficiency No prior oxygen requirement and patient with chest x-ray showing hazy bilateral opacities concerning for atypical/viral infection and new  2 L oxygen requirement. Did have elevated procalcitonin however no significant derangements on VBG. Mild leukocytosis also noted. Treated with CAP coverage including ceftriaxone, azithromycin with improvement in symptoms and ability to wean down oxygen requirement to room air prior to discharge. She completed 3 days of ceftriaxone/ azithromycin while admitted and will transition to oral augmentin at discharge. Ambulated with PT and cleared for discharge back to her independent living facility. Evaluated by SLP and cleared for regular diet. #LUE cellulitis #Lymphedema Known lymphedema in left upper extremity status post mastectomy with prior radiation. Did have increasing edema/erythema over the past few days during her hospitalization. Left upper extremity ultrasound performed during hospitalization with no evidence of DVT. Overlying erythema likely a cellulitis which was treated with antibiotics, as above. #Streptococcus mitis positive blood culture 1 of 2 blood cultures on admission demonstrating GPC's in clusters. Ultimately speciated to streptococcus mitis oralis group, suspect related to a contaminant. Repeat blood cultures were obtained and without growth. Discharge Follow-up Action Items: Follow up with PCP in 1-2 weeks Abx: Start augmentin XR BID x 7 days to complete treatment for CAP and cellulitis  Reviewed labs from [facility] Metabolic profile 2/95 shows normal renal function, electrolytes okay.  CBC also within normal limits Patient Active Problem List   Diagnosis Date Noted   Paroxysmal atrial fibrillation (HCC) 08/13/2018   Right knee pain 11/16/2017   Osteopenia 11/12/2016   Mild cognitive disorder 10/10/2016   Chronic anticoagulation 10/04/2016   History of atrial fibrillation 10/04/2016   Essential hypertension, benign 09/01/2016   Paroxysmal supraventricular tachycardia 09/01/2016   Hyperlipidemia 09/01/2016    Past Medical History:  Diagnosis Date   Anemia  Blood  transfusion without reported diagnosis    Breast cancer (HCC)    Cardiomyopathy (HCC)    Cataract    Dyslipidemia    Hyperlipidemia    Hypertension    Paroxysmal supraventricular tachycardia    Permanent atrial fibrillation (HCC)    Stroke [facility])     Past Surgical History:  Procedure Laterality Date   MASTECTOMY     TONSILLECTOMY     WRIST SURGERY      Social History   Tobacco Use   Smoking status: Never   Smokeless tobacco: Never  Vaping Use   Vaping Use: Never used  Substance Use Topics   Alcohol use: No   Drug use: No    Family History  Problem Relation Age of Onset   Epilepsy Mother    Stroke Father    Heart disease Maternal Aunt     Allergies  Allergen Reactions   Tramadol Other (See Comments)    seizure   Sertraline Other (See Comments)   Zoloft [Sertraline Hcl]     headache    Medication list has been reviewed and updated.  Current Outpatient Medications on File Prior to Visit  Medication Sig Dispense Refill   ABRYSVO 120 MCG/0.5ML injection      amoxicillin-clavulanate (AUGMENTIN XR) 1000-62.5 MG 12 hr tablet Take 2 tablets by mouth 2 (two) times daily.     apixaban (ELIQUIS) 5 MG TABS tablet Take 1 tablet (5 mg total) by mouth 2 (two) times daily. 180 tablet 3   Cholecalciferol (VITAMIN D3) 1000 units CAPS Take by mouth.     donepezil (ARICEPT ODT) 5 MG disintegrating tablet Take 1 tablet (5 mg total) by mouth at bedtime. 90 tablet 1   furosemide (LASIX) 20 MG tablet Take 0.5 tablets (10 mg total) by mouth daily. 45 tablet 3   losartan (COZAAR) 50 MG tablet Take 1 tablet (50 mg total) by mouth daily. 90 tablet 3   metoprolol succinate (TOPROL-XL) 25 MG 24 hr tablet Take 1 tablet (25 mg total) by mouth daily. 90 tablet 3   QUEtiapine (SEROQUEL) 25 MG tablet Take 1 tablet (25 mg total) by mouth at bedtime. Increase to BID as directed by MD 180 tablet 1   No current facility-administered medications on file prior to visit.    Review of  Systems:  As per HPI- otherwise negative.   Physical Examination: There were no vitals filed for this visit. There were no vitals filed for this visit. There is no height or weight on file to calculate BMI. Ideal Body Weight:    GEN: no acute distress. HEENT: Atraumatic, Normocephalic.  Ears and Nose: No external deformity. CV: RRR, No M/G/R. No JVD. No thrill. No extra heart sounds. PULM: CTA B, no wheezes, crackles, rhonchi. No retractions. No resp. distress. No accessory muscle use. ABD: S, NT, ND, +BS. No rebound. No HSM. EXTR: No c/c/e PSYCH: Normally interactive. Conversant.    Assessment and Plan: ***  Signed Abbe Amsterdam, MD

## 2023-01-29 LAB — LAB REPORT - SCANNED: EGFR: 9

## 2023-01-31 ENCOUNTER — Ambulatory Visit: Payer: Medicare HMO | Admitting: Family Medicine

## 2023-01-31 ENCOUNTER — Encounter: Payer: Self-pay | Admitting: Family Medicine

## 2023-02-03 NOTE — Progress Notes (Unsigned)
[facility] [address] [place], [place] [address] [phone]  Date:  02/05/2023   Name:  Carol [name]   DOB:  1928-05-10   MRN:  [id number]  PCP:  Pearline Cables, MD    Chief Complaint: No chief complaint on file.   History of Present Illness:  Carol [name] is a 89 y.o. very pleasant female patient who presents with the following:  Patient seen today for hospitalization follow-up- history of a fib on eliquis, hyperlipidemia, HTN, mild dementia, breast cancer, prior stroke, vitamin D deficiency, pulmonary edema/fluid overload Her brother and sister-in-law are her main support people  I last saw her in the office in mid April of this year She was admitted and [facility] from 5/28 through 01/25/2023 with community-acquired pneumonia and left arm cellulitis #Community-acquired pneumonia #Acute hypoxic respiratory insufficiency No prior oxygen requirement and patient with chest x-ray showing hazy bilateral opacities concerning for atypical/viral infection and new 2 L oxygen requirement. Did have elevated procalcitonin however no significant derangements on VBG. Mild leukocytosis also noted. Treated with CAP coverage including ceftriaxone, azithromycin with improvement in symptoms and ability to wean down oxygen requirement to room air prior to discharge. She completed 3 days of ceftriaxone/ azithromycin while admitted and will transition to oral augmentin at discharge. Ambulated with PT and cleared for discharge back to her independent living facility. Evaluated by SLP and cleared for regular diet.  #LUE cellulitis #Lymphedema Known lymphedema in left upper extremity status post mastectomy with prior radiation. Did have increasing edema/erythema over the past few days during her hospitalization. Left upper extremity ultrasound performed during hospitalization with no evidence  of DVT. Overlying erythema likely a cellulitis which was treated with antibiotics, as above.  #Streptococcus mitis positive blood culture 1 of 2 blood cultures on admission demonstrating GPC's in clusters. Ultimately speciated to streptococcus mitis oralis group, suspect related to a contaminant. Repeat blood cultures were obtained and without growth.  Discharge Follow-up Action Items: Follow up with PCP in 1-2 weeks Abx: Start augmentin XR BID x 7 days to complete treatment for CAP and cellulitis   Patient Active Problem List   Diagnosis Date Noted   Paroxysmal atrial fibrillation (HCC) 08/13/2018   Right knee pain 11/16/2017   Osteopenia 11/12/2016   Mild cognitive disorder 10/10/2016   Chronic anticoagulation 10/04/2016   History of atrial fibrillation 10/04/2016   Essential hypertension, benign 09/01/2016   Paroxysmal supraventricular tachycardia 09/01/2016   Hyperlipidemia 09/01/2016    Past Medical History:  Diagnosis Date   Anemia    Blood transfusion without reported diagnosis    Breast cancer (HCC)    Cardiomyopathy (HCC)    Cataract    Dyslipidemia    Hyperlipidemia    Hypertension    Paroxysmal supraventricular tachycardia    Permanent atrial fibrillation (HCC)    Stroke [facility])     Past Surgical History:  Procedure Laterality Date   MASTECTOMY     TONSILLECTOMY     WRIST SURGERY      Social History   Tobacco Use   Smoking status: Never   Smokeless tobacco: Never  Vaping Use   Vaping Use: Never used  Substance Use Topics   Alcohol use: No   Drug use: No    Family History  Problem Relation Age of Onset   Epilepsy Mother    Stroke Father    Heart disease Maternal  Aunt     Allergies  Allergen Reactions   Tramadol Other (See Comments)    seizure   Sertraline Other (See Comments)   Zoloft [Sertraline Hcl]     headache    Medication list has been reviewed and updated.  Current Outpatient Medications on File Prior to Visit  Medication Sig  Dispense Refill   ABRYSVO 120 MCG/0.5ML injection      amoxicillin-clavulanate (AUGMENTIN XR) 1000-62.5 MG 12 hr tablet Take 2 tablets by mouth 2 (two) times daily.     apixaban (ELIQUIS) 5 MG TABS tablet Take 1 tablet (5 mg total) by mouth 2 (two) times daily. 180 tablet 3   Cholecalciferol (VITAMIN D3) 1000 units CAPS Take by mouth.     donepezil (ARICEPT ODT) 5 MG disintegrating tablet Take 1 tablet (5 mg total) by mouth at bedtime. 90 tablet 1   furosemide (LASIX) 20 MG tablet Take 0.5 tablets (10 mg total) by mouth daily. 45 tablet 3   losartan (COZAAR) 50 MG tablet Take 1 tablet (50 mg total) by mouth daily. 90 tablet 3   metoprolol succinate (TOPROL-XL) 25 MG 24 hr tablet Take 1 tablet (25 mg total) by mouth daily. 90 tablet 3   QUEtiapine (SEROQUEL) 25 MG tablet Take 1 tablet (25 mg total) by mouth at bedtime. Increase to BID as directed by MD 180 tablet 1   No current facility-administered medications on file prior to visit.    Review of Systems:  ***  Physical Examination: There were no vitals filed for this visit. There were no vitals filed for this visit. There is no height or weight on file to calculate BMI. Ideal Body Weight:    ***  Assessment and Plan: ***  Signed Abbe Amsterdam, MD

## 2023-02-05 ENCOUNTER — Ambulatory Visit (HOSPITAL_BASED_OUTPATIENT_CLINIC_OR_DEPARTMENT_OTHER)
Admission: RE | Admit: 2023-02-05 | Discharge: 2023-02-05 | Disposition: A | Discharge status: Home / Self Care | Payer: Medicare HMO | Source: Ambulatory Visit | Attending: Family Medicine | Admitting: Family Medicine

## 2023-02-05 ENCOUNTER — Ambulatory Visit (INDEPENDENT_AMBULATORY_CARE_PROVIDER_SITE_OTHER): Payer: Medicare HMO | Admitting: Family Medicine

## 2023-02-05 ENCOUNTER — Encounter: Payer: Self-pay | Admitting: Family Medicine

## 2023-02-05 VITALS — BP 118/60 | HR 90 | Temp 97.8°F | Resp 18 | Ht 62.0 in | Wt 132.2 lb

## 2023-02-05 DIAGNOSIS — S2231XA Fracture of one rib, right side, initial encounter for closed fracture: Secondary | ICD-10-CM | POA: Diagnosis not present

## 2023-02-05 DIAGNOSIS — R052 Subacute cough: Secondary | ICD-10-CM | POA: Diagnosis not present

## 2023-02-05 DIAGNOSIS — J189 Pneumonia, unspecified organism: Secondary | ICD-10-CM | POA: Insufficient documentation

## 2023-02-05 MED ORDER — PREDNISONE 20 MG PO TABS
ORAL_TABLET | ORAL | 0 refills | Status: DC
Start: 2023-02-05 — End: 2023-10-06

## 2023-02-05 NOTE — Patient Instructions (Signed)
It was good to see you today- I will be in touch with your chest x-ray asap We may need to use antibiotics again- however I also suspect we will need steroids to help with cough, I went ahead and sent these in   Please keep me posted about how Carol [name] is feeling

## 2023-02-07 ENCOUNTER — Encounter: Payer: Self-pay | Admitting: Family Medicine

## 2023-02-09 ENCOUNTER — Encounter: Payer: Self-pay | Admitting: Family Medicine

## 2023-02-28 ENCOUNTER — Other Ambulatory Visit: Payer: Self-pay | Admitting: Family Medicine

## 2023-02-28 DIAGNOSIS — R410 Disorientation, unspecified: Secondary | ICD-10-CM

## 2023-04-08 ENCOUNTER — Other Ambulatory Visit: Payer: Self-pay | Admitting: Physician Assistant

## 2023-04-08 DIAGNOSIS — I4891 Unspecified atrial fibrillation: Secondary | ICD-10-CM

## 2023-04-09 NOTE — Telephone Encounter (Signed)
Prescription refill request for Eliquis received. Indication:afib Last office visit:4/24 Scr:0.67  5/24 Age: 89 Weight:60  kg  Prescription refilled

## 2023-04-16 ENCOUNTER — Other Ambulatory Visit: Payer: Self-pay | Admitting: Family Medicine

## 2023-04-24 ENCOUNTER — Other Ambulatory Visit: Payer: Self-pay

## 2023-04-24 MED ORDER — METOPROLOL SUCCINATE ER 25 MG PO TB24: 25.0000 mg | ORAL_TABLET | Freq: Every day | ORAL | 2 refills | Status: DC

## 2023-04-25 ENCOUNTER — Encounter: Payer: Self-pay | Admitting: Family Medicine

## 2023-05-12 ENCOUNTER — Other Ambulatory Visit: Payer: Self-pay | Admitting: Family Medicine

## 2023-05-12 DIAGNOSIS — R410 Disorientation, unspecified: Secondary | ICD-10-CM

## 2023-07-04 ENCOUNTER — Encounter: Payer: Self-pay | Admitting: Physician Assistant

## 2023-07-04 ENCOUNTER — Ambulatory Visit: Payer: Medicare HMO | Attending: Physician Assistant | Admitting: Physician Assistant

## 2023-07-04 VITALS — BP 118/78 | HR 61 | Ht 62.0 in | Wt 139.6 lb

## 2023-07-04 DIAGNOSIS — I340 Nonrheumatic mitral (valve) insufficiency: Secondary | ICD-10-CM

## 2023-07-04 DIAGNOSIS — I071 Rheumatic tricuspid insufficiency: Secondary | ICD-10-CM

## 2023-07-04 DIAGNOSIS — I371 Nonrheumatic pulmonary valve insufficiency: Secondary | ICD-10-CM

## 2023-07-04 DIAGNOSIS — I10 Essential (primary) hypertension: Secondary | ICD-10-CM | POA: Diagnosis not present

## 2023-07-04 DIAGNOSIS — I429 Cardiomyopathy, unspecified: Secondary | ICD-10-CM

## 2023-07-04 DIAGNOSIS — E785 Hyperlipidemia, unspecified: Secondary | ICD-10-CM | POA: Diagnosis not present

## 2023-07-04 NOTE — Progress Notes (Signed)
Office Visit    Patient Name: Carol [name] Date of Encounter: 07/04/2023  PCP:  Pearline Cables, MD   [facility]  Cardiologist:  Donato Schultz, MD  Advanced Practice Provider:  No care team member to display Electrophysiologist:  None   HPI    Carol [name] is a 89 y.o. female with a past medical history of permanent atrial fibrillation, anemia, breast CA status post remote double mastectomy, dyslipidemia, hypertension, PSVT, stroke, dementia, bradycardia (requiring decrease in beta-blocker), mild cardiomyopathy (EF 50% in 2017), mild MR/TR/PR by echo 2017 presents today for follow-up appointment.  Per review of notes, she moved here from [place] several years ago.  She had a reported prior EF of 40% that improved to 50% after rate control.  Last scanned echocardiogram from 2017 showed LVEF 50%, LV function variable secondary to atrial fibrillation, mild LAE, moderate RAE, normal RV, mild TR, mild aortic sclerosis without stenosis, mild MR, mild pulmonary hypertension, mild TR, dilated IVC.  She is on anticoagulation and rate control.  It appears now that her atrial fibrillation is permanent.  She was seen by Carol Spies, PA-C 06/08/2022 and was doing well at that time.  She lives at ALF.  She had not had any recent chest pain, palpitations, shortness of breath, bleeding, or syncope.  Was seen by me April 2024, she tells me she has been feel good without any chest pain or SOB. We discussed getting an updated lipid panel but her PCP just got lab work yesterday and she has a bid bruise on her arm. For this reason, we agreed it would be fine to wait until her 34-month follow-up with her PCP to get an updated lipid panel.  We also discussed updating an echocardiogram since the last time we checked her heart valves was in 2017.  Today, she presents with a history of atrial fibrillation and hyperlipidemia for a routine follow-up. She reports no new symptoms and is doing  well on her current medication regimen. SHe has not experienced any issues with her medications and has a support system in place to remind her  to take her medications.  The patient's blood pressure and heart rate are well controlled. She has not had any recent cholesterol checks, and the patient and myself agree to have this done at his next annual visit with his primary care provider.  The patient's atrial fibrillation has been asymptomatic, and she has not experienced any bleeding while on Eliquis. Her most recent echocardiogram showed stable heart valves, with a slight worsening of the tricuspid valve, which is not causing any symptoms.  Reports no shortness of breath nor dyspnea on exertion. Reports no chest pain, pressure, or tightness. No edema, orthopnea, PND. Reports no palpitations.   Past Medical History    Past Medical History:  Diagnosis Date   Anemia    Blood transfusion without reported diagnosis    Breast cancer (HCC)    Cardiomyopathy (HCC)    Cataract    Dyslipidemia    Hyperlipidemia    Hypertension    Paroxysmal supraventricular tachycardia (HCC)    Permanent atrial fibrillation (HCC)    Stroke [facility])    Past Surgical History:  Procedure Laterality Date   MASTECTOMY     TONSILLECTOMY     WRIST SURGERY      Allergies  Allergies  Allergen Reactions   Tramadol Other (See Comments)    seizure   Sertraline Other (See Comments)   Zoloft [Sertraline  Hcl]     headache    EKGs/Labs/Other Studies Reviewed:   The following studies were reviewed today: Scanned in results above  EKG:  EKG is not ordered today.   Recent Labs: 11/16/2022: ALT 11; TSH 1.97 01/25/2023: BUN 15; Creatinine 0.7; Hemoglobin 12.8; Platelets 164; Potassium 4.3; Sodium 135  Recent Lipid Panel    Component Value Date/Time   CHOL 203 (H) 08/19/2018 1350   CHOL 182 09/12/2016 0943   TRIG 100.0 08/19/2018 1350   HDL 69.10 08/19/2018 1350   HDL 81 09/12/2016 0943   CHOLHDL 3 08/19/2018  1350   VLDL 20.0 08/19/2018 1350   LDLCALC 114 (H) 08/19/2018 1350   LDLCALC 87 09/12/2016 0943    Risk Assessment/Calculations:   CHA2DS2-VASc Score = 7   This indicates a 11.2% annual risk of stroke. The patient's score is based upon: CHF History: 1 HTN History: 1 Diabetes History: 0 Stroke History: 2 Vascular Disease History: 0 Age Score: 2 Gender Score: 1     Home Medications   Current Meds  Medication Sig   ABRYSVO 120 MCG/0.5ML injection    acetaminophen (TYLENOL) 500 MG tablet Take 500 mg by mouth every 8 (eight) hours as needed.   Cholecalciferol (VITAMIN D3) 1000 units CAPS Take by mouth.   donepezil (ARICEPT ODT) 5 MG disintegrating tablet DISSOLVE 1 TABLET ON THE TONGUE AT BEDTIME   ELIQUIS 5 MG TABS tablet TAKE 1 TABLET TWICE DAILY   FLUZONE HIGH-DOSE 0.5 ML injection Inject 0.5 mLs into the muscle once.   furosemide (LASIX) 20 MG tablet Take 0.5 tablets (10 mg total) by mouth daily.   losartan (COZAAR) 50 MG tablet Take 1 tablet (50 mg total) by mouth daily.   metoprolol succinate (TOPROL-XL) 25 MG 24 hr tablet Take 1 tablet (25 mg total) by mouth daily.   predniSONE (DELTASONE) 20 MG tablet Take 40 mg by mouth daily for 3 days, then 20 mg by mouth daily for 3 days   QUEtiapine (SEROQUEL) 25 MG tablet TAKE 1 TABLET BY MOUTH AT BEDTIME. INCREASE TO TWICE DAILY AS DIRECTED BY MD     Review of Systems      All other systems reviewed and are otherwise negative except as noted above.  Physical Exam    VS:  BP 118/78   Pulse 61   Ht 5\' 2"  (1.575 m)   Wt 139 lb 9.6 oz (63.3 kg)   LMP  (LMP Unknown)   SpO2 96%   BMI 25.53 kg/m  , BMI Body mass index is 25.53 kg/m.  Wt Readings from Last 3 Encounters:  07/04/23 139 lb 9.6 oz (63.3 kg)  02/05/23 132 lb 3.2 oz (60 kg)  12/15/22 134 lb (60.8 kg)     GEN: Well nourished, well developed, in no acute distress. HEENT: normal. Neck: Supple, no JVD, carotid bruits, or masses. Cardiac: RRR, no murmurs, rubs,  or gallops. No clubbing, cyanosis, edema.  Radials/PT 2+ and equal bilaterally.  Respiratory:  Respirations regular and unlabored, clear to auscultation bilaterally. GI: Soft, nontender, nondistended. MS: No deformity or atrophy. Skin: Warm and dry, no rash. Neuro:  Strength and sensation are intact. Psych: Normal affect.  Assessment & Plan    Mild cardiomyopathy, mild MR/TR/PR -echo reviewed and stable -stable from a symptom standpoint  Permanent Atrial Fibrillation No symptoms reported. On Eliquis with no reported bleeding. In sinus rhythm on examination. -Continue current management.  Hyperlipidemia No recent lipid panel available. Discussed the importance of annual lipid panel monitoring. -  Plan to have lipid panel checked at next primary care visit with Dr. Dallas Schimke in the spring.  Follow-up Stable, no new symptoms or issues. -Schedule follow-up appointment in 6 months with Dr. Nonnie Done.       Disposition: Follow up 6 months with Donato Schultz, MD or APP.  Signed, Sharlene Dory, PA-C 07/04/2023, 2:05 PM [facility]

## 2023-07-04 NOTE — Patient Instructions (Signed)
Medication Instructions:  Your physician recommends that you continue on your current medications as directed. Please refer to the Current Medication list given to you today.  *If you need a refill on your cardiac medications before your next appointment, please call your pharmacy*   Lab Work: None ordered  If you have labs (blood work) drawn today and your tests are completely normal, you will receive your results only by: MyChart Message (if you have MyChart) OR A paper copy in the mail If you have any lab test that is abnormal or we need to change your treatment, we will call you to review the results.   Testing/Procedures: None ordered   Follow-Up: At [facility], you and your health needs are our priority.  As part of our continuing mission to provide you with exceptional heart care, we have created designated Provider Care Teams.  These Care Teams include your primary Cardiologist (physician) and Advanced Practice Providers (APPs -  Physician Assistants and Nurse Practitioners) who all work together to provide you with the care you need, when you need it.  We recommend signing up for the patient portal called "MyChart".  Sign up information is provided on this After Visit Summary.  MyChart is used to connect with patients for Virtual Visits (Telemedicine).  Patients are able to view lab/test results, encounter notes, upcoming appointments, etc.  Non-urgent messages can be sent to your provider as well.   To learn more about what you can do with MyChart, go to [email or web address].    Your next appointment:   6 month(s)  Provider:   Mark Skains, MD     Other Instructions   

## 2023-07-25 ENCOUNTER — Other Ambulatory Visit: Payer: Self-pay

## 2023-07-25 DIAGNOSIS — I4891 Unspecified atrial fibrillation: Secondary | ICD-10-CM

## 2023-07-25 MED ORDER — LOSARTAN POTASSIUM 50 MG PO TABS
50.0000 mg | ORAL_TABLET | Freq: Every day | ORAL | 3 refills | Status: DC
Start: 2023-07-25 — End: 2023-10-10

## 2023-08-17 ENCOUNTER — Ambulatory Visit: Payer: Self-pay | Admitting: Family Medicine

## 2023-08-17 ENCOUNTER — Ambulatory Visit (HOSPITAL_BASED_OUTPATIENT_CLINIC_OR_DEPARTMENT_OTHER)
Admission: RE | Admit: 2023-08-17 | Discharge: 2023-08-17 | Disposition: A | Discharge status: Home / Self Care | Payer: Medicare HMO | Source: Ambulatory Visit | Attending: Medical | Admitting: Medical

## 2023-08-17 ENCOUNTER — Ambulatory Visit (INDEPENDENT_AMBULATORY_CARE_PROVIDER_SITE_OTHER): Payer: Medicare HMO | Admitting: Medical

## 2023-08-17 ENCOUNTER — Encounter: Payer: Self-pay | Admitting: Medical

## 2023-08-17 VITALS — BP 128/72 | HR 100 | Resp 18 | Wt 139.0 lb

## 2023-08-17 DIAGNOSIS — M25561 Pain in right knee: Secondary | ICD-10-CM | POA: Insufficient documentation

## 2023-08-17 DIAGNOSIS — M79651 Pain in right thigh: Secondary | ICD-10-CM

## 2023-08-17 DIAGNOSIS — M79609 Pain in unspecified limb: Secondary | ICD-10-CM | POA: Insufficient documentation

## 2023-08-17 DIAGNOSIS — M25551 Pain in right hip: Secondary | ICD-10-CM | POA: Diagnosis not present

## 2023-08-17 DIAGNOSIS — M79604 Pain in right leg: Secondary | ICD-10-CM | POA: Diagnosis not present

## 2023-08-17 DIAGNOSIS — I878 Other specified disorders of veins: Secondary | ICD-10-CM | POA: Diagnosis not present

## 2023-08-17 DIAGNOSIS — T148XXA Other injury of unspecified body region, initial encounter: Secondary | ICD-10-CM | POA: Diagnosis not present

## 2023-08-17 DIAGNOSIS — M1611 Unilateral primary osteoarthritis, right hip: Secondary | ICD-10-CM | POA: Diagnosis not present

## 2023-08-17 DIAGNOSIS — R600 Localized edema: Secondary | ICD-10-CM | POA: Diagnosis not present

## 2023-08-17 LAB — CBC WITH DIFFERENTIAL/PLATELET
Absolute Lymphocytes: 1958 {cells}/uL (ref 850–3900)
Absolute Monocytes: 842 {cells}/uL (ref 200–950)
Basophils Absolute: 101 {cells}/uL (ref 0–200)
Basophils Relative: 1.4 %
Eosinophils Absolute: 353 {cells}/uL (ref 15–500)
Eosinophils Relative: 4.9 %
HCT: 38.2 % (ref 35.0–45.0)
Hemoglobin: 12.7 g/dL (ref 11.7–15.5)
MCH: 29.8 pg (ref 27.0–33.0)
MCHC: 33.2 g/dL (ref 32.0–36.0)
MCV: 89.7 fL (ref 80.0–100.0)
MPV: 12.3 fL (ref 7.5–12.5)
Monocytes Relative: 11.7 %
Neutro Abs: 3946 {cells}/uL (ref 1500–7800)
Neutrophils Relative %: 54.8 %
Platelets: 229 10*3/uL (ref 140–400)
RBC: 4.26 10*6/uL (ref 3.80–5.10)
RDW: 13.1 % (ref 11.0–15.0)
Total Lymphocyte: 27.2 %
WBC: 7.2 10*3/uL (ref 3.8–10.8)

## 2023-08-17 NOTE — Progress Notes (Unsigned)
   Subjective:    Patient ID: Carol [name], female    DOB: September 03, 1927, 89 y.o.   MRN: [id number]  HPI The patient, a 89 year old with an unclear past medical history, presented with a chief complaint of right knee pain and extensive bruising following a fall. The incident occurred approximately a week prior to the consultation, inside a residential building on a hard floor. The patient was immediately assisted by residents who witnessed the fall.  The patient reported being able to walk, albeit with a constant awareness of the knee pain. The pain was described as tolerable, rating it a 4 to 5 on a scale of 10. The pain did not appear to disrupt sleep significantly, although the patient did mention experiencing general restlessness at night.  Upon examination, the patient also reported mild pain at time level 5/10 pain in the right knee,  right hip and distal thigh femur area. However, there were no complaints of lower back pain. The patient denied any other symptoms or health issues related to the fall.     Review of Systems     Objective:   Physical Exam        Assessment & Plan:   Patient Instructions  Right Knee Pain, distal thigh femur pain, bruising and mild hip pain Fall occurred approximately 10 days ago. Moderate pain on ambulation, no pain at rest. Extensive bruising on the right knee and distal thigh. Mild pain on hip exam. -Order knee x-ray to rule out fracture. Femur xray and hip xray as well. -Order CBC to check platelet count due to extensive bruising. -Advise patient to take Tylenol for pain management, with low-dose ibuprofen as a backup if needed. -Consider referral to sports medicine or orthopedist depending on x-ray results.  Follow-up date and possible referral to be determined Dependent on x-ray and lab results.    [phone] and [phone]

## 2023-08-17 NOTE — Telephone Encounter (Signed)
Triage obtained with brother, pt was in the same room with pt and could be heard contributing to triagers questions.   Chief Complaint: fall-R knee pain when walking Symptoms: bruised, swelling Frequency: 5 days Pertinent Negatives: Patient denies fever, denies abrasions,   Disposition: [] ED /[] Urgent Care (no appt availability in office) / [x] Appointment(In office/virtual)/ []  [place]/ [] Home Care/ [] Refused Recommended Disposition /[] [facility]/ []  Follow-up with PCP  Additional Notes: Pt fell 5 days ago. + blood thinner use. Per pt brother pt is alert and oriented and has remind alert and oriented since the fall. However, pt R knee swollen and bruised, pain worse with movement. Pt can walk, but painful. Pt sched for today with pcp office. Advised to call back should s/s increase. Advised to use ice for pain prior to appt.   Copied from CRM [id number]. Topic: Clinical - Red Word Triage >> Aug 17, 2023 10:24 AM Carol [name] wrote: Red Word that prompted transfer to Nurse Triage: FALL Reason for Disposition  Large swelling or bruise (> 2 inches or 5 cm)  Answer Assessment - Initial Assessment Questions 1. MECHANISM: "How did the injury happen?" (e.g., twisting injury, direct blow)      Fall 5 days ago 2. ONSET: "When did the injury happen?" (Minutes or hours ago)      5 days ago 3. LOCATION: "Where is the injury located?"      R knee 4. APPEARANCE of INJURY: "What does the injury look like?"      Bruised, swelling,  5. SEVERITY: "Can you put weight on that leg?" "Can you walk?"      Can walk on it, more sore than has been  6. SIZE: For cuts, bruises, or swelling, ask: "How large is it?" (e.g., inches or centimeters;  entire joint)      No cuts, just bruises, swelling 7. PAIN: "Is there pain?" If Yes, ask: "How bad is the pain?"  "What does it keep you from doing?" (e.g., Scale 1-10; or mild, moderate, severe)   -  NONE: (0): no pain   -  MILD (1-3): doesn't  interfere with normal activities    -  MODERATE (4-7): interferes with normal activities (e.g., work or school) or awakens from sleep, limping    -  SEVERE (8-10): excruciating pain, unable to do any normal activities, unable to walk     Very painful when she walks. 6/7, does not hurt when she is not walking  Protocols used: Knee Injury-[name]-AH

## 2023-08-17 NOTE — Telephone Encounter (Signed)
 Appt today w/ Ramon Dredge

## 2023-08-17 NOTE — Patient Instructions (Addendum)
Right Knee Pain, distal thigh femur pain, bruising and mild hip pain Fall occurred approximately 10 days ago. Moderate pain on ambulation, no pain at rest. Extensive bruising on the right knee and distal thigh. Mild pain on hip exam. -Order knee x-ray to rule out fracture. Femur xray and hip xray as well. -Order CBC to check platelet count due to extensive bruising. -Advise patient to take Tylenol for pain management, with low-dose ibuprofen as a backup if needed. -Consider referral to sports medicine or orthopedist depending on x-ray results.  At end of exam pt noted some shortness of breath last night. With fall/trauma and large bruise along with mild popiteal pain decided to get rt lower ext [place] stat   Follow-up date and possible referral to be determined Dependent on x-ray and lab results.

## 2023-08-27 ENCOUNTER — Ambulatory Visit: Payer: Medicare HMO | Admitting: Family Medicine

## 2023-08-27 ENCOUNTER — Encounter: Payer: Self-pay | Admitting: Family Medicine

## 2023-08-27 VITALS — BP 139/41 | Ht 62.0 in | Wt 139.0 lb

## 2023-08-27 DIAGNOSIS — S8001XA Contusion of right knee, initial encounter: Secondary | ICD-10-CM

## 2023-08-27 DIAGNOSIS — M7041 Prepatellar bursitis, right knee: Secondary | ICD-10-CM | POA: Diagnosis not present

## 2023-08-27 NOTE — Progress Notes (Unsigned)
CHIEF COMPLAINT: No chief complaint on file.  _____________________________________________________________ SUBJECTIVE  HPI  Pt is a 89 y.o. female here for evaluation of Right knee pain following injury. Seen 12/20 PCP office for right knee pain, distal thigh femur pain, bruising and mild hip pain.  Note reviewed: -Injury sustained 08/12/2023, occurred inside residential building on hardwood floor, witnessed fall, was assisted by residents.  Presumed no at bedtime/LOC -Was able to walk but has had constant wariness of knee pain -On anticoagulation -Duplex demonstrated no DVT.  Hip x-ray without fracture.  Femur x-ray without fracture.  Knee x-ray without fracture -Advised Tylenol, low-dose ibuprofen, referral placed to sports medicine  Pain primarily located Radiating Numbness/tingling Worsening/getting better Exacerbated by Catching/locking Therapies tried  08/17/2023 x-ray of the knee independently reviewed, there is mild narrowing of medial joint spacing, signs of chondrocalcinosis primarily visualized in lateral joint space.  Mild tricompartmental degenerative changes.  Soft tissue swelling visualized on x-ray lateral views, insall salvati estimated at 1.4      ------------------------------------------------------------------------------------------------------ Past Medical History:  Diagnosis Date   Anemia    Blood transfusion without reported diagnosis    Breast cancer (HCC)    Cardiomyopathy (HCC)    Cataract    Dyslipidemia    Hyperlipidemia    Hypertension    Paroxysmal supraventricular tachycardia (HCC)    Permanent atrial fibrillation (HCC)    Stroke [facility])     Past Surgical History:  Procedure Laterality Date   MASTECTOMY     TONSILLECTOMY     WRIST SURGERY        Outpatient Encounter Medications as of 08/27/2023  Medication Sig   ABRYSVO 120 MCG/0.5ML injection    acetaminophen (TYLENOL) 500 MG tablet Take 500 mg by mouth every 8 (eight) hours as  needed.   Cholecalciferol (VITAMIN D3) 1000 units CAPS Take by mouth.   donepezil (ARICEPT ODT) 5 MG disintegrating tablet DISSOLVE 1 TABLET ON THE TONGUE AT BEDTIME   ELIQUIS 5 MG TABS tablet TAKE 1 TABLET TWICE DAILY   FLUZONE HIGH-DOSE 0.5 ML injection Inject 0.5 mLs into the muscle once.   furosemide (LASIX) 20 MG tablet Take 0.5 tablets (10 mg total) by mouth daily.   losartan (COZAAR) 50 MG tablet Take 1 tablet (50 mg total) by mouth daily.   metoprolol succinate (TOPROL-XL) 25 MG 24 hr tablet Take 1 tablet (25 mg total) by mouth daily.   predniSONE (DELTASONE) 20 MG tablet Take 40 mg by mouth daily for 3 days, then 20 mg by mouth daily for 3 days   QUEtiapine (SEROQUEL) 25 MG tablet TAKE 1 TABLET BY MOUTH AT BEDTIME. INCREASE TO TWICE DAILY AS DIRECTED BY MD   No facility-administered encounter medications on file as of 08/27/2023.    ------------------------------------------------------------------------------------------------------  _____________________________________________________________ OBJECTIVE  PHYSICAL EXAM  There were no vitals filed for this visit. There is no height or weight on file to calculate BMI.   reviewed  General: A+Ox3, no acute distress, well-nourished, appropriate affect CV: pulses 2+ regular, nondiaphoretic, no peripheral edema, cap refill <2sec Lungs: no audible wheezing, non-labored breathing, bilateral chest rise/fall, nontachypneic Skin: warm, well-perfused, non-icteric, no susp lesions or rashes Neuro: CN II-XII intact bilaterally. Sensation intact, muscle tone wnl, no atrophy Psych: no signs of depression or anxiety MSK: ***      _____________________________________________________________ ASSESSMENT/PLAN There are no diagnoses linked to this encounter.  Reassuring POCUS hematoma Summary: ***  Differential Diagnosis:  Treatment:   No follow-ups on file.  Electronically signed by: Burna Forts, MD  08/27/2023 12:02 PM

## 2023-08-30 ENCOUNTER — Other Ambulatory Visit: Payer: Self-pay

## 2023-08-30 DIAGNOSIS — S8001XA Contusion of right knee, initial encounter: Secondary | ICD-10-CM

## 2023-08-30 DIAGNOSIS — M7041 Prepatellar bursitis, right knee: Secondary | ICD-10-CM

## 2023-09-04 ENCOUNTER — Encounter: Payer: Self-pay | Admitting: Family Medicine

## 2023-09-05 ENCOUNTER — Ambulatory Visit: Payer: Medicare HMO | Admitting: Medical

## 2023-09-05 ENCOUNTER — Ambulatory Visit (HOSPITAL_BASED_OUTPATIENT_CLINIC_OR_DEPARTMENT_OTHER)
Admission: RE | Admit: 2023-09-05 | Discharge: 2023-09-05 | Disposition: A | Discharge status: Home / Self Care | Payer: Medicare HMO | Source: Ambulatory Visit | Attending: Medical | Admitting: Medical

## 2023-09-05 ENCOUNTER — Encounter: Payer: Self-pay | Admitting: Medical

## 2023-09-05 VITALS — BP 120/90 | HR 120 | Temp 97.8°F | Resp 18 | Ht 62.0 in | Wt 144.2 lb

## 2023-09-05 DIAGNOSIS — M79602 Pain in left arm: Secondary | ICD-10-CM | POA: Insufficient documentation

## 2023-09-05 DIAGNOSIS — M7989 Other specified soft tissue disorders: Secondary | ICD-10-CM | POA: Insufficient documentation

## 2023-09-05 DIAGNOSIS — L089 Local infection of the skin and subcutaneous tissue, unspecified: Secondary | ICD-10-CM | POA: Diagnosis not present

## 2023-09-05 LAB — CBC WITH DIFFERENTIAL/PLATELET
Absolute Lymphocytes: 1717 {cells}/uL (ref 850–3900)
Absolute Monocytes: 957 {cells}/uL — ABNORMAL HIGH (ref 200–950)
Basophils Absolute: 70 {cells}/uL (ref 0–200)
Basophils Relative: 1.2 %
Eosinophils Absolute: 302 {cells}/uL (ref 15–500)
Eosinophils Relative: 5.2 %
HCT: 35.6 % (ref 35.0–45.0)
Hemoglobin: 11.6 g/dL — ABNORMAL LOW (ref 11.7–15.5)
MCH: 29.7 pg (ref 27.0–33.0)
MCHC: 32.6 g/dL (ref 32.0–36.0)
MCV: 91.3 fL (ref 80.0–100.0)
MPV: 10.9 fL (ref 7.5–12.5)
Monocytes Relative: 16.5 %
Neutro Abs: 2755 {cells}/uL (ref 1500–7800)
Neutrophils Relative %: 47.5 %
Platelets: 300 10*3/uL (ref 140–400)
RBC: 3.9 10*6/uL (ref 3.80–5.10)
RDW: 13.8 % (ref 11.0–15.0)
Total Lymphocyte: 29.6 %
WBC: 5.8 10*3/uL (ref 3.8–10.8)

## 2023-09-05 MED ORDER — DOXYCYCLINE HYCLATE 100 MG PO TABS: 100.0000 mg | ORAL_TABLET | Freq: Two times a day (BID) | ORAL | 0 refills | Status: DC

## 2023-09-05 NOTE — Progress Notes (Signed)
   Subjective:    Patient ID: Carol [name], female    DOB: Aug 05, 1928, 89 y.o.   MRN: [id number]  HPI Discussed the use of AI scribe software for clinical note transcription with the patient, who gave verbal consent to proceed.  History of Present Illness   The patient, with a very remote history of breast surgery and lymphedema, presents with left arm swelling and discomfort that has been progressively worsening over the past five days. The swelling was preceded by redness in the area. The patient reports intermittent pain in the arm, which is not consistently provoked by pressure or touch. The discomfort is significant enough to disrupt sleep. On palpation, the patient identifies a hard, swollen area in the arm.   The patient also reports a fall approximately two to three weeks prior to the onset of the current symptoms. However, the fall was on the right side and did not immediately result in any left arm pain. The patient does not recall any significant trauma to the left arm during the fall. No report of bruising.  The patient has a history of lymphedema in the left arm following a mastectomy performed several decades ago. The patient used to wear a sleeve for the lymphedema, but this was discontinued many years ago. The current swelling is reported to be significantly worse than the usual lymphedema-related swelling.  The patient denies any recent fevers, chills, or sweats. The patient lives independently and is checked on daily by a caregiver. The patient is currently visiting from [place] and lives approximately 20 minutes away from the clinic.        Review of Systems See hpi    Objective:   Physical Exam   General Mental Status- Alert. General Appearance- Not in acute distress.   Skin General: Color- Normal Color. Moisture- Normal Moisture.  Neck Carotid Arteries- Normal color. Moisture- Normal Moisture. No carotid bruits. No JVD.  Chest and Lung  Exam Auscultation: Breath Sounds:-Normal.  Cardiovascular Auscultation:Rythm- Regular. Murmurs & Other Heart Sounds:Auscultation of the heart reveals- No Murmurs.    Neurologic Cranial Nerve exam:- CN III-XII intact(No nystagmus), symmetric smile. Drift Test:- No drift. Romberg Exam:- Negative.  Heal to Toe Gait exam:-Normal. Finger to Nose:- Normal/Intact Strength:- 5/5 equal and symmetric strength both upper and lower extremities.   Left arm: Left arm exhibits swelling, tenderness upon palpation, redness and induration in the bottom tricep area and faint red lateral aspect. Forearm shows slight redness. Rt bicep area 9 inch left side bicep 14.5 inche Rt forearm- 7.5 inch approx  left side 11 inch    Assessment & Plan:  [id number]. Rj brother [phone]  Patient Instructions  Left Arm Swelling Noted 5 days ago, with a history of a fall 2-3 weeks prior. No immediate pain or bruising after the fall. Patient has a history of breast surgery and lymphedema, but the current swelling is significantly more than usual. The arm is notably swollen with a pinkish-red appearance and induration. Differential includes cellulitis or deep vein thrombosis (DVT). -Order STAT CBC to assess for infection. -Order left upper extremity ultrasound to evaluate for DVT. -Plan to decide on treatment based on lab and ultrasound results. Contact patient with results and next steps. Advised keep  volume up on phone and charge. Have 2 family member numbers    Time spent with patient today was  41 minutes which consisted of chart review, discussing diagnosis, work up, treatment and documentation.  Answering question of patient and family member

## 2023-09-05 NOTE — Patient Instructions (Signed)
 Left Arm Swelling Noted 5 days ago, with a history of a fall 2-3 weeks prior. No immediate pain or bruising after the fall. Patient has a history of breast surgery and lymphedema, but the current swelling is significantly more than usual. The arm is notably swollen with a pinkish-red appearance and induration. Differential includes cellulitis or deep vein thrombosis (DVT). -Order STAT CBC to assess for infection. -Order left upper extremity ultrasound to evaluate for DVT. -Plan to decide on treatment based on lab and ultrasound results. Contact patient with results and next steps. Advised keep  volume up on phone and charge. Have 2 family member numbers

## 2023-09-05 NOTE — Addendum Note (Signed)
 Addended by: Gwenevere Abbot on: 09/05/2023 09:57 PM   Modules accepted: Orders

## 2023-09-05 NOTE — Telephone Encounter (Signed)
 Dr. Watt,  I sent you a note today on this pt. Her left arm was severely swollen. I thought dvt vs cellulitis vs lymphadema(remote breast CA hx and surgery). Years ago stopped wearing sleeve and her arm never swelled like this per pt report. Negative US  for DVT and wbc not elevated. I sent in doxycycline  to cover cellulitis but wanted to know if you could see her Friday or Monday. I am not in town next week but wanted you to be aware of her situation. Note measurements on her arms.  Thanks,  Dallas [name], PA-C

## 2023-09-06 ENCOUNTER — Telehealth: Payer: Self-pay

## 2023-09-06 ENCOUNTER — Other Ambulatory Visit: Payer: Self-pay | Admitting: Family Medicine

## 2023-09-06 DIAGNOSIS — M7989 Other specified soft tissue disorders: Secondary | ICD-10-CM

## 2023-09-06 DIAGNOSIS — M79602 Pain in left arm: Secondary | ICD-10-CM

## 2023-09-06 DIAGNOSIS — L089 Local infection of the skin and subcutaneous tissue, unspecified: Secondary | ICD-10-CM

## 2023-09-06 MED ORDER — DOXYCYCLINE HYCLATE 100 MG PO CAPS: 100.0000 mg | ORAL_CAPSULE | Freq: Two times a day (BID) | ORAL | 0 refills | Status: DC

## 2023-09-06 NOTE — Telephone Encounter (Signed)
 Copied from CRM [id number]. Topic: Clinical - Medication Refill >> Sep 06, 2023  8:54 AM Macario HERO wrote: Most Recent Primary Care Visit:  Provider: DORINA LOVING  Department: [facility]  Visit Type: ACUTE  Date: 08/17/2023  Medication: doxycycline  (VIBRA -TABS) 100 MG tablet [[id number]]  Has the patient contacted their pharmacy? No (Agent: If no, request that the patient contact the pharmacy for the refill. If patient does not wish to contact the pharmacy document the reason why and proceed with request.) (Agent: If yes, when and what did the pharmacy advise?)  Is this the correct pharmacy for this prescription? Yes If no, delete pharmacy and type the correct one.  This is the patient's preferred pharmacy:   [id number] - [place], [place] - [address] [place] [address] Phone: [phone] Fax: [phone]   Has the prescription been filled recently? No  Is the patient out of the medication? No  Has the patient been seen for an appointment in the last year OR does the patient have an upcoming appointment? Yes  Can we respond through MyChart? Yes  Agent: Please be advised that Rx refills may take up to 3 business days. We ask that you follow-up with your pharmacy.

## 2023-09-06 NOTE — Telephone Encounter (Signed)
 Doxycycline was sent to pharmacy yesterday by Ramon Dredge.

## 2023-09-06 NOTE — Telephone Encounter (Signed)
 Copied from CRM [id number]. Topic: Clinical - Prescription Issue >> Sep 06, 2023  9:02 AM Macario HERO wrote: Reason for CRM: Patient sister in law called and requested doxycycline  (VIBRA -TABS) 100 MG tablet [[id number]]  be sent to [id number] - [place], [place] - [address] [facility] [address] [place] [address] Phone: [phone] Fax: [phone]

## 2023-09-06 NOTE — Telephone Encounter (Signed)
 Called and spoke with her SIL Avel- Octivia seems a bit more herself today-she seems to be feeling overall better Antibiotic prescription was sent to mail order pharmacy at [facility].  I will resend prescription to local pharmacy She has an appointment to see me on Monday for recheck

## 2023-09-08 ENCOUNTER — Other Ambulatory Visit: Payer: Self-pay | Admitting: Family Medicine

## 2023-09-09 NOTE — Patient Instructions (Addendum)
 It was great to see you again today-happy new year!  If not done already recommend the shingles vaccine series at your pharmacy, and a COVID booster if not in the last 6 months or so  Please go to the lab, and then to the ground floor for x-rays of your chest and left arm.  I am also going to arrange a CT of your chest this week  If anything is changing or getting worse-especially you develop pain or numbness in your arm-please seek care right away!

## 2023-09-09 NOTE — Progress Notes (Addendum)
 [facility] [address], [address] [place], [place] [address] [phone]  Date:  09/10/2023   Name:  Carol [name]   DOB:  04-28-1928   MRN:  [id number]  PCP:  Watt Harlene BROCKS, MD    Chief Complaint: Follow-up (Concerns/ questions: L arm edema/Flu shot today: received already)   History of Present Illness:  Carol [name] is a 89 y.o. very pleasant female patient who presents with the following:  Patient seen today for follow-up visit- history of a fib on eliquis , hyperlipidemia, HTN, mild dementia, breast cancer, prior stroke, vitamin D  deficiency, pulmonary edema/fluid overload Her brother and sister-in-law are her main support people-she is accompanied today by her brother  Most recently seen by myself in June at which time she was in her normal state of health, she was admitted at [facility] in May 2024 with pneumonia and cellulitis of her LEFT arm My partner Dallas [name] saw her on January 8 with pain and swelling in her left arm-he treated her with antibiotics and obtain an ultrasound to rule out DVT.  Ultrasound was negative but showed significant swelling Family reports that swelling started 2 to 3 days prior to their visit.  They are not aware of any injury or other cause I called and spoke with her sister-in-law [name] the day after visit with Dallas, patient seemed to be doing better.  We made this follow-up visit appointment  Per Edward's note dated 09/06/23: Left arm: Left arm exhibits swelling, tenderness upon palpation, redness and induration in the bottom tricep area and faint red lateral aspect. Forearm shows slight redness. Rt bicep area 9 inch left side bicep 14.5 inche Rt forearm- 7.5 inch approx  left side 11 inch   Patient Active Problem List   Diagnosis Date Noted   Paroxysmal atrial fibrillation (HCC) 08/13/2018   Right knee pain 11/16/2017   Osteopenia 11/12/2016   Mild cognitive disorder 10/10/2016    Chronic anticoagulation 10/04/2016   History of atrial fibrillation 10/04/2016   Essential hypertension, benign 09/01/2016   Paroxysmal supraventricular tachycardia (HCC) 09/01/2016   Hyperlipidemia 09/01/2016    Past Medical History:  Diagnosis Date   Anemia    Blood transfusion without reported diagnosis    Breast cancer (HCC)    Cardiomyopathy (HCC)    Cataract    Dyslipidemia    Hyperlipidemia    Hypertension    Paroxysmal supraventricular tachycardia (HCC)    Permanent atrial fibrillation (HCC)    Stroke [facility])     Past Surgical History:  Procedure Laterality Date   MASTECTOMY     TONSILLECTOMY     WRIST SURGERY      Social History   Tobacco Use   Smoking status: Never   Smokeless tobacco: Never  Vaping Use   Vaping status: Never Used  Substance Use Topics   Alcohol use: No   Drug use: No    Family History  Problem Relation Age of Onset   Epilepsy Mother    Stroke Father    Heart disease Maternal Aunt     Allergies  Allergen Reactions   Tramadol Other (See Comments)    seizure   Sertraline Other (See Comments)   Zoloft [Sertraline Hcl]     headache    Medication list has been reviewed and updated.  Current Outpatient Medications on File Prior to Visit  Medication Sig Dispense Refill   ABRYSVO 120 MCG/0.5ML injection  acetaminophen  (TYLENOL ) 500 MG tablet Take 500 mg by mouth every 8 (eight) hours as needed.     Cholecalciferol  (VITAMIN D3) 1000 units CAPS Take by mouth.     donepezil  (ARICEPT  ODT) 5 MG disintegrating tablet DISSOLVE 1 TABLET ON THE TONGUE AT BEDTIME 90 tablet 3   doxycycline  (VIBRAMYCIN ) 100 MG capsule Take 1 capsule (100 mg total) by mouth 2 (two) times daily. 20 capsule 0   ELIQUIS  5 MG TABS tablet TAKE 1 TABLET TWICE DAILY 180 tablet 3   FLUZONE HIGH-DOSE 0.5 ML injection Inject 0.5 mLs into the muscle once.     furosemide  (LASIX ) 20 MG tablet TAKE 1/2 TABLET EVERY DAY 45 tablet 3   losartan  (COZAAR ) 50 MG tablet Take  1 tablet (50 mg total) by mouth daily. 90 tablet 3   metoprolol  succinate (TOPROL -XL) 25 MG 24 hr tablet Take 1 tablet (25 mg total) by mouth daily. 90 tablet 2   predniSONE  (DELTASONE ) 20 MG tablet Take 40 mg by mouth daily for 3 days, then 20 mg by mouth daily for 3 days 9 tablet 0   QUEtiapine  (SEROQUEL ) 25 MG tablet TAKE 1 TABLET BY MOUTH AT BEDTIME. INCREASE TO TWICE DAILY AS DIRECTED BY MD 180 tablet 3   No current facility-administered medications on file prior to visit.    Review of Systems:  As per HPI- otherwise negative.   Physical Examination: Vitals:   09/10/23 1407  BP: 130/72  Pulse: 92  Resp: 18  Temp: 97.8 F (36.6 C)   Vitals:   09/10/23 1407  Height: 5' 2 (1.575 m)   Body mass index is 26.37 kg/m. Ideal Body Weight: Weight in (lb) to have BMI = 25: 136.4  GEN: no acute distress. Elderly lady, looks well except for swollen left arm HEENT: Atraumatic, Normocephalic.  Ears and Nose: No external deformity. CV: RRR, No M/G/R. No JVD. No thrill. No extra heart sounds. PULM: CTA B, no wheezes, crackles, rhonchi. No retractions. No resp. distress. No accessory muscle use. ABD: S, NT, ND EXTR: No c/c/e PSYCH: Normally interactive. Conversant.  Right arm appears completely normal Left arm is grossly swollen.  Hand is warm and seems well-perfused but not able to palpate a radial pulse due to swelling.  Cap refill of the fingers is normal.  Patient denies any discomfort with movement of her finger, wrist, elbow or shoulder joints.  Arm is not tender to palpation and I do not see any evidence of cellulitis Compared with measurements from 1/9 she is about 1/2 inch diameter larger at both forearm and biceps Her neck, upper chest also seem normal.   Assessment and Plan: Left arm swelling - Plan: CT Angio Chest W/Cm &/Or Wo Cm, DG Chest 2 View, DG Humerus Left, DG Forearm Left, Comprehensive metabolic panel, Sedimentation rate  Patient seen today with significant  swelling of her left arm.  She feels well, denies any pain or numbness She has already had an ultrasound to rule out DVT.  Patient does have history of breast cancer-she had a bilateral mastectomy though her cancer was unilateral.  She likely had a lymph node dissection of some sort, but she does not remember which side.  This was many years ago and her brother also does not remember. However, she has never had significant lymphadenopathy from breast cancer in the past  Concern for thoracic outlet syndrome, obtain CT angiogram chest as above.  I will also get plain films of her humerus and forearm due to unusual  situation.  Lab work is pending as above  I have asked patient to please let her family know if she develops pain or other symptoms.  Her brother keeps an excellent eye on her  Signed Harlene Schroeder, MD  Received x-rays as below, message to patient  DG Forearm Left Result Date: 09/10/2023 CLINICAL DATA:  Fall with left arm swelling. EXAM: LEFT FOREARM - 2 VIEW COMPARISON:  None Available. FINDINGS: No acute fracture. Distal radius hardware is intact without periprosthetic lucency. Wrist and elbow alignment are maintained. No erosive or bony destructive change. Prominent generalized subcutaneous edema. No soft tissue gas or radiopaque foreign body. IMPRESSION: 1. No fracture of the forearm. Intact distal radial surgical hardware. 2. Generalized soft tissue edema. Electronically Signed   By: Andrea Gasman M.D.   On: 09/10/2023 15:47   DG Humerus Left Result Date: 09/10/2023 CLINICAL DATA:  Fall with left arm swelling. EXAM: LEFT HUMERUS - 2+ VIEW COMPARISON:  None Available. FINDINGS: There is no evidence of fracture or other focal bone lesions. Cortical margins of the humerus are intact. Shoulder and elbow alignment are maintained. Soft tissue edema but the distal medial arm, partially obscured on the AP view due to overlapping soft tissue. No radiopaque foreign body. IMPRESSION: No  humeral fracture.  Soft tissue edema. Electronically Signed   By: Andrea Gasman M.D.   On: 09/10/2023 15:45   DG Chest 2 View Result Date: 09/10/2023 CLINICAL DATA:  Status post fall.  Left arm swelling. EXAM: CHEST - 2 VIEW COMPARISON:  Chest radiograph 02/05/2023 FINDINGS: Unchanged cardiomegaly. Stable mediastinal contours. No pneumothorax or focal airspace disease. No pleural effusion. Multiple bilateral axillary surgical clips. Chronic right proximal humeral deformity. On limited assessment, no grossly displaced rib fracture. IMPRESSION: Chronic cardiomegaly.  No acute findings. Electronically Signed   By: Andrea Gasman M.D.   On: 09/10/2023 15:44   Addendum 1/14, received lab results as below.  Message to patient  Results for orders placed or performed in visit on 09/10/23  Comprehensive metabolic panel   Collection Time: 09/10/23  3:03 PM  Result Value Ref Range   Sodium 138 135 - 145 mEq/L   Potassium 4.2 3.5 - 5.1 mEq/L   Chloride 102 96 - 112 mEq/L   CO2 28 19 - 32 mEq/L   Glucose, Bld 104 (H) 70 - 99 mg/dL   BUN 18 6 - 23 mg/dL   Creatinine, Ser 8.95 0.40 - 1.20 mg/dL   Total Bilirubin 0.7 0.2 - 1.2 mg/dL   Alkaline Phosphatase 52 39 - 117 U/L   AST 21 0 - 37 U/L   ALT 11 0 - 35 U/L   Total Protein 7.0 6.0 - 8.3 g/dL   Albumin 4.1 3.5 - 5.2 g/dL   GFR 53.57 (L) >39.99 mL/min   Calcium 9.6 8.4 - 10.5 mg/dL  Sedimentation rate   Collection Time: 09/10/23  3:03 PM  Result Value Ref Range   Sed Rate 7 0 - 30 mm/hr

## 2023-09-10 ENCOUNTER — Encounter: Payer: Self-pay | Admitting: Family Medicine

## 2023-09-10 ENCOUNTER — Ambulatory Visit (HOSPITAL_BASED_OUTPATIENT_CLINIC_OR_DEPARTMENT_OTHER)
Admission: RE | Admit: 2023-09-10 | Discharge: 2023-09-10 | Disposition: A | Discharge status: Home / Self Care | Payer: Medicare HMO | Source: Ambulatory Visit | Attending: Family Medicine | Admitting: Family Medicine

## 2023-09-10 ENCOUNTER — Ambulatory Visit (INDEPENDENT_AMBULATORY_CARE_PROVIDER_SITE_OTHER): Payer: Medicare HMO | Admitting: Family Medicine

## 2023-09-10 VITALS — BP 130/72 | HR 92 | Temp 97.8°F | Resp 18 | Ht 62.0 in | Wt 136.0 lb

## 2023-09-10 DIAGNOSIS — Z043 Encounter for examination and observation following other accident: Secondary | ICD-10-CM | POA: Diagnosis not present

## 2023-09-10 DIAGNOSIS — M7989 Other specified soft tissue disorders: Secondary | ICD-10-CM | POA: Insufficient documentation

## 2023-09-10 DIAGNOSIS — Z4789 Encounter for other orthopedic aftercare: Secondary | ICD-10-CM | POA: Diagnosis not present

## 2023-09-10 DIAGNOSIS — I517 Cardiomegaly: Secondary | ICD-10-CM | POA: Diagnosis not present

## 2023-09-11 ENCOUNTER — Encounter: Payer: Self-pay | Admitting: Family Medicine

## 2023-09-11 ENCOUNTER — Encounter (HOSPITAL_BASED_OUTPATIENT_CLINIC_OR_DEPARTMENT_OTHER): Payer: Self-pay

## 2023-09-11 ENCOUNTER — Ambulatory Visit (HOSPITAL_BASED_OUTPATIENT_CLINIC_OR_DEPARTMENT_OTHER)
Admission: RE | Admit: 2023-09-11 | Discharge: 2023-09-11 | Disposition: A | Discharge status: Home / Self Care | Payer: Medicare HMO | Source: Ambulatory Visit | Attending: Family Medicine | Admitting: Family Medicine

## 2023-09-11 ENCOUNTER — Telehealth: Payer: Self-pay | Admitting: Family Medicine

## 2023-09-11 DIAGNOSIS — M7989 Other specified soft tissue disorders: Secondary | ICD-10-CM | POA: Insufficient documentation

## 2023-09-11 DIAGNOSIS — K2100 Gastro-esophageal reflux disease with esophagitis, without bleeding: Secondary | ICD-10-CM | POA: Diagnosis not present

## 2023-09-11 DIAGNOSIS — J90 Pleural effusion, not elsewhere classified: Secondary | ICD-10-CM | POA: Diagnosis not present

## 2023-09-11 DIAGNOSIS — J984 Other disorders of lung: Secondary | ICD-10-CM | POA: Diagnosis not present

## 2023-09-11 DIAGNOSIS — R918 Other nonspecific abnormal finding of lung field: Secondary | ICD-10-CM | POA: Diagnosis not present

## 2023-09-11 LAB — COMPREHENSIVE METABOLIC PANEL
ALT: 11 U/L (ref 0–35)
AST: 21 U/L (ref 0–37)
Albumin: 4.1 g/dL (ref 3.5–5.2)
Alkaline Phosphatase: 52 U/L (ref 39–117)
BUN: 18 mg/dL (ref 6–23)
CO2: 28 meq/L (ref 19–32)
Calcium: 9.6 mg/dL (ref 8.4–10.5)
Chloride: 102 meq/L (ref 96–112)
Creatinine, Ser: 1.04 mg/dL (ref 0.40–1.20)
GFR: 46.42 mL/min — ABNORMAL LOW (ref >60.00)
Glucose, Bld: 104 mg/dL — ABNORMAL HIGH (ref 70–99)
Potassium: 4.2 meq/L (ref 3.5–5.1)
Sodium: 138 meq/L (ref 135–145)
Total Bilirubin: 0.7 mg/dL (ref 0.2–1.2)
Total Protein: 7 g/dL (ref 6.0–8.3)

## 2023-09-11 LAB — SEDIMENTATION RATE: Sed Rate: 7 mm/h (ref 0–30)

## 2023-09-11 MED ORDER — IOHEXOL 350 MG/ML SOLN
100.0000 mL | Freq: Once | INTRAVENOUS | Status: AC | PRN
  Administered 2023-09-11: 100 mL via INTRAVENOUS

## 2023-09-11 NOTE — Addendum Note (Signed)
 Addended by: Abbe Amsterdam C on: 09/11/2023 02:59 PM   Modules accepted: Orders

## 2023-09-11 NOTE — Telephone Encounter (Signed)
 Called and went over her CT with her brother and SIL.  Carol [name] seems about the same today, no worse and no pain in her arm.  She got SOB with extended walking today but this is not a new finding.  Advised evidence of right sided heart failure on her CT- I will be in touch with her cardiology team tomorrow.  If they are concerned can take her to the ER tonight - family wishes to not put her through this but instead to contact cardiology tomorrow which I will do

## 2023-09-12 ENCOUNTER — Other Ambulatory Visit: Payer: Self-pay | Admitting: Family Medicine

## 2023-09-18 ENCOUNTER — Telehealth: Payer: Self-pay | Admitting: Family Medicine

## 2023-09-18 NOTE — Telephone Encounter (Signed)
Called and discussed with her brother-he has not seen for the last couple of days, but her caregiver notes arm seems to be a bit better.  I made an appointment to see her this coming Monday for recheck

## 2023-09-22 NOTE — Progress Notes (Unsigned)
[facility] [address], [address] [place], [place] [address] [phone]  Date:  09/24/2023   Name:  HARUYE LAINEZ   DOB:  07/23/1928   MRN:  [id number]  PCP:  Pearline Cables, MD    Chief Complaint: No chief complaint on file.   History of Present Illness:  Carol [name] is a 89 y.o. very pleasant female patient who presents with the following:  Patient seen today to follow-up on left arm swelling-  history of a fib on eliquis, hyperlipidemia, HTN, mild dementia, breast cancer, prior stroke, vitamin D deficiency, pulmonary edema/fluid overload  Please see previous notes dated 1/13 and 09/05/2023  Earlier this month patient developed significant swelling of her left arm, completely painless.  She has had workup including ultrasound of the left upper extremity, plain film radiography of the chest, forearm and humerus, and a CT angiogram of her chest.  All has been basically unrevealing.  She does have a known history of right heart failure but this is stable.  At this point our working diagnosis is lymphedema, likely related to previous history of breast cancer and axillary lymph node dissection  We had her increase her dose of furosemide from 10 to 20 mg daily and plan for recheck today  Patient Active Problem List   Diagnosis Date Noted   Paroxysmal atrial fibrillation (HCC) 08/13/2018   Right knee pain 11/16/2017   Osteopenia 11/12/2016   Mild cognitive disorder 10/10/2016   Chronic anticoagulation 10/04/2016   History of atrial fibrillation 10/04/2016   Essential hypertension, benign 09/01/2016   Paroxysmal supraventricular tachycardia (HCC) 09/01/2016   Hyperlipidemia 09/01/2016    Past Medical History:  Diagnosis Date   Anemia    Blood transfusion without reported diagnosis    Breast cancer (HCC)    Cardiomyopathy (HCC)    Cataract    Dyslipidemia    Hyperlipidemia    Hypertension    Paroxysmal  supraventricular tachycardia (HCC)    Permanent atrial fibrillation (HCC)    Stroke [facility])     Past Surgical History:  Procedure Laterality Date   MASTECTOMY     TONSILLECTOMY     WRIST SURGERY      Social History   Tobacco Use   Smoking status: Never   Smokeless tobacco: Never  Vaping Use   Vaping status: Never Used  Substance Use Topics   Alcohol use: No   Drug use: No    Family History  Problem Relation Age of Onset   Epilepsy Mother    Stroke Father    Heart disease Maternal Aunt     Allergies  Allergen Reactions   Tramadol Other (See Comments)    seizure   Sertraline Other (See Comments)   Zoloft [Sertraline Hcl]     headache    Medication list has been reviewed and updated.  Current Outpatient Medications on File Prior to Visit  Medication Sig Dispense Refill   ABRYSVO 120 MCG/0.5ML injection      acetaminophen (TYLENOL) 500 MG tablet Take 500 mg by mouth every 8 (eight) hours as needed.     Cholecalciferol (VITAMIN D3) 1000 units CAPS Take by mouth.     donepezil (ARICEPT ODT) 5 MG disintegrating tablet DISSOLVE 1 TABLET ON THE TONGUE AT BEDTIME 90 tablet 3   ELIQUIS 5 MG TABS tablet TAKE 1 TABLET TWICE DAILY 180 tablet 3   FLUZONE HIGH-DOSE 0.5 ML injection Inject 0.5 mLs into the  muscle once.     furosemide (LASIX) 20 MG tablet TAKE 1/2 TABLET EVERY DAY 45 tablet 3   losartan (COZAAR) 50 MG tablet Take 1 tablet (50 mg total) by mouth daily. 90 tablet 3   metoprolol succinate (TOPROL-XL) 25 MG 24 hr tablet Take 1 tablet (25 mg total) by mouth daily. 90 tablet 2   predniSONE (DELTASONE) 20 MG tablet Take 40 mg by mouth daily for 3 days, then 20 mg by mouth daily for 3 days 9 tablet 0   QUEtiapine (SEROQUEL) 25 MG tablet TAKE 1 TABLET BY MOUTH AT BEDTIME. INCREASE TO TWICE DAILY AS DIRECTED BY MD 180 tablet 3   No current facility-administered medications on file prior to visit.    Review of Systems:  As per HPI- otherwise negative.   Physical  Examination: There were no vitals filed for this visit. There were no vitals filed for this visit. There is no height or weight on file to calculate BMI. Ideal Body Weight:    GEN: no acute distress. HEENT: Atraumatic, Normocephalic.  Ears and Nose: No external deformity. CV: RRR, No M/G/R. No JVD. No thrill. No extra heart sounds. PULM: CTA B, no wheezes, crackles, rhonchi. No retractions. No resp. distress. No accessory muscle use. ABD: S, NT, ND, +BS. No rebound. No HSM. EXTR: No c/c/e PSYCH: Normally interactive. Conversant.    Assessment and Plan: ***  Signed Abbe Amsterdam, MD

## 2023-09-24 ENCOUNTER — Ambulatory Visit (INDEPENDENT_AMBULATORY_CARE_PROVIDER_SITE_OTHER): Payer: Medicare HMO | Admitting: Family Medicine

## 2023-09-24 ENCOUNTER — Encounter: Payer: Self-pay | Admitting: Family Medicine

## 2023-09-24 VITALS — BP 124/72 | HR 114 | Temp 98.0°F | Resp 18 | Ht 62.0 in | Wt 142.0 lb

## 2023-09-24 DIAGNOSIS — M7989 Other specified soft tissue disorders: Secondary | ICD-10-CM

## 2023-09-24 DIAGNOSIS — R944 Abnormal results of kidney function studies: Secondary | ICD-10-CM | POA: Diagnosis not present

## 2023-09-24 NOTE — Patient Instructions (Signed)
It was good to see you today, I will be in touch with your lab work.  I want to see if your kidneys are doing okay on the higher dose of furosemide.  If it looks like they are struggling at all we will drop back to your previous dose-I am not convinced the higher dose of furosemide is doing that much to help this.  As we discussed, at this point I suspect your swelling is due to lymphedema-likely related to the lymph node surgery you had years ago with the breast cancer.  I am going to work on finding you a compression sleeve for your arm to see if that will help

## 2023-09-25 ENCOUNTER — Encounter: Payer: Self-pay | Admitting: Family Medicine

## 2023-09-25 LAB — BASIC METABOLIC PANEL
BUN: 13 mg/dL (ref 6–23)
CO2: 32 meq/L (ref 19–32)
Calcium: 9.7 mg/dL (ref 8.4–10.5)
Chloride: 99 meq/L (ref 96–112)
Creatinine, Ser: 0.97 mg/dL (ref 0.40–1.20)
GFR: 50.46 mL/min — ABNORMAL LOW (ref >60.00)
Glucose, Bld: 105 mg/dL — ABNORMAL HIGH (ref 70–99)
Potassium: 3.8 meq/L (ref 3.5–5.1)
Sodium: 139 meq/L (ref 135–145)

## 2023-10-01 ENCOUNTER — Ambulatory Visit: Payer: Medicare HMO | Admitting: Nurse Practitioner

## 2023-10-04 ENCOUNTER — Ambulatory Visit: Payer: Medicare HMO | Admitting: Rehabilitation

## 2023-10-06 ENCOUNTER — Observation Stay (HOSPITAL_BASED_OUTPATIENT_CLINIC_OR_DEPARTMENT_OTHER)
Admission: EM | Admit: 2023-10-06 | Discharge: 2023-10-10 | Disposition: A | Discharge status: Home / Self Care | Payer: Medicare HMO | Attending: Internal Medicine | Admitting: Internal Medicine

## 2023-10-06 ENCOUNTER — Other Ambulatory Visit: Payer: Self-pay

## 2023-10-06 ENCOUNTER — Encounter (HOSPITAL_BASED_OUTPATIENT_CLINIC_OR_DEPARTMENT_OTHER): Payer: Self-pay | Admitting: Emergency Medicine

## 2023-10-06 ENCOUNTER — Emergency Department (HOSPITAL_BASED_OUTPATIENT_CLINIC_OR_DEPARTMENT_OTHER): Payer: Medicare HMO

## 2023-10-06 DIAGNOSIS — Z8673 Personal history of transient ischemic attack (TIA), and cerebral infarction without residual deficits: Secondary | ICD-10-CM | POA: Insufficient documentation

## 2023-10-06 DIAGNOSIS — R112 Nausea with vomiting, unspecified: Secondary | ICD-10-CM | POA: Diagnosis not present

## 2023-10-06 DIAGNOSIS — I4821 Permanent atrial fibrillation: Secondary | ICD-10-CM | POA: Insufficient documentation

## 2023-10-06 DIAGNOSIS — W19XXXA Unspecified fall, initial encounter: Secondary | ICD-10-CM | POA: Diagnosis not present

## 2023-10-06 DIAGNOSIS — L8990 Pressure ulcer of unspecified site, unspecified stage: Secondary | ICD-10-CM | POA: Diagnosis not present

## 2023-10-06 DIAGNOSIS — Z853 Personal history of malignant neoplasm of breast: Secondary | ICD-10-CM | POA: Diagnosis not present

## 2023-10-06 DIAGNOSIS — Y92009 Unspecified place in unspecified non-institutional (private) residence as the place of occurrence of the external cause: Secondary | ICD-10-CM

## 2023-10-06 DIAGNOSIS — Z1152 Encounter for screening for COVID-19: Secondary | ICD-10-CM | POA: Diagnosis not present

## 2023-10-06 DIAGNOSIS — R42 Dizziness and giddiness: Secondary | ICD-10-CM | POA: Diagnosis not present

## 2023-10-06 DIAGNOSIS — Z79899 Other long term (current) drug therapy: Secondary | ICD-10-CM | POA: Diagnosis not present

## 2023-10-06 DIAGNOSIS — I10 Essential (primary) hypertension: Secondary | ICD-10-CM | POA: Diagnosis not present

## 2023-10-06 DIAGNOSIS — I480 Paroxysmal atrial fibrillation: Secondary | ICD-10-CM | POA: Diagnosis not present

## 2023-10-06 DIAGNOSIS — E538 Deficiency of other specified B group vitamins: Secondary | ICD-10-CM | POA: Diagnosis not present

## 2023-10-06 DIAGNOSIS — R531 Weakness: Secondary | ICD-10-CM | POA: Diagnosis not present

## 2023-10-06 DIAGNOSIS — Z7901 Long term (current) use of anticoagulants: Secondary | ICD-10-CM | POA: Diagnosis not present

## 2023-10-06 DIAGNOSIS — N3000 Acute cystitis without hematuria: Secondary | ICD-10-CM | POA: Diagnosis not present

## 2023-10-06 DIAGNOSIS — M47812 Spondylosis without myelopathy or radiculopathy, cervical region: Secondary | ICD-10-CM | POA: Diagnosis not present

## 2023-10-06 DIAGNOSIS — S3210XA Unspecified fracture of sacrum, initial encounter for closed fracture: Secondary | ICD-10-CM | POA: Diagnosis not present

## 2023-10-06 DIAGNOSIS — F0390 Unspecified dementia without behavioral disturbance: Secondary | ICD-10-CM | POA: Diagnosis not present

## 2023-10-06 DIAGNOSIS — R296 Repeated falls: Secondary | ICD-10-CM

## 2023-10-06 DIAGNOSIS — G9389 Other specified disorders of brain: Secondary | ICD-10-CM | POA: Diagnosis not present

## 2023-10-06 DIAGNOSIS — R110 Nausea: Secondary | ICD-10-CM | POA: Diagnosis not present

## 2023-10-06 DIAGNOSIS — I429 Cardiomyopathy, unspecified: Secondary | ICD-10-CM | POA: Insufficient documentation

## 2023-10-06 DIAGNOSIS — N390 Urinary tract infection, site not specified: Secondary | ICD-10-CM | POA: Diagnosis not present

## 2023-10-06 DIAGNOSIS — I700 Atherosclerosis of aorta: Secondary | ICD-10-CM | POA: Diagnosis not present

## 2023-10-06 DIAGNOSIS — I361 Nonrheumatic tricuspid (valve) insufficiency: Secondary | ICD-10-CM | POA: Insufficient documentation

## 2023-10-06 LAB — CBC WITH DIFFERENTIAL/PLATELET
Abs Immature Granulocytes: 0.02 10*3/uL (ref 0.00–0.07)
Basophils Absolute: 0.1 10*3/uL (ref 0.0–0.1)
Basophils Relative: 1 %
Eosinophils Absolute: 0.4 10*3/uL (ref 0.0–0.5)
Eosinophils Relative: 7 %
HCT: 40.2 % (ref 36.0–46.0)
Hemoglobin: 13.4 g/dL (ref 12.0–15.0)
Immature Granulocytes: 0 %
Lymphocytes Relative: 18 %
Lymphs Abs: 1.2 10*3/uL (ref 0.7–4.0)
MCH: 30.1 pg (ref 26.0–34.0)
MCHC: 33.3 g/dL (ref 30.0–36.0)
MCV: 90.3 fL (ref 80.0–100.0)
Monocytes Absolute: 0.8 10*3/uL (ref 0.1–1.0)
Monocytes Relative: 12 %
Neutro Abs: 4 10*3/uL (ref 1.7–7.7)
Neutrophils Relative %: 62 %
Platelets: 221 10*3/uL (ref 150–400)
RBC: 4.45 MIL/uL (ref 3.87–5.11)
RDW: 14.4 % (ref 11.5–15.5)
WBC: 6.5 10*3/uL (ref 4.0–10.5)
nRBC: 0 % (ref 0.0–0.2)

## 2023-10-06 LAB — URINALYSIS, ROUTINE W REFLEX MICROSCOPIC
Bilirubin Urine: NEGATIVE
Glucose, UA: NEGATIVE mg/dL
Hgb urine dipstick: NEGATIVE
Ketones, ur: NEGATIVE mg/dL
Nitrite: NEGATIVE
Protein, ur: NEGATIVE mg/dL
Specific Gravity, Urine: 1.015 (ref 1.005–1.030)
pH: 5.5 (ref 5.0–8.0)

## 2023-10-06 LAB — RESP PANEL BY RT-PCR (RSV, FLU A&B, COVID)  RVPGX2
Influenza A by PCR: NEGATIVE
Influenza B by PCR: NEGATIVE
Resp Syncytial Virus by PCR: NEGATIVE
SARS Coronavirus 2 by RT PCR: NEGATIVE

## 2023-10-06 LAB — COMPREHENSIVE METABOLIC PANEL
ALT: 14 U/L (ref 0–44)
AST: 26 U/L (ref 15–41)
Albumin: 3.8 g/dL (ref 3.5–5.0)
Alkaline Phosphatase: 53 U/L (ref 38–126)
Anion gap: 11 (ref 5–15)
BUN: 14 mg/dL (ref 8–23)
CO2: 27 mmol/L (ref 22–32)
Calcium: 9.5 mg/dL (ref 8.9–10.3)
Chloride: 98 mmol/L (ref 98–111)
Creatinine, Ser: 0.75 mg/dL (ref 0.44–1.00)
GFR, Estimated: >60 mL/min (ref >60)
Glucose, Bld: 116 mg/dL — ABNORMAL HIGH (ref 70–99)
Potassium: 3.9 mmol/L (ref 3.5–5.1)
Sodium: 136 mmol/L (ref 135–145)
Total Bilirubin: 1.2 mg/dL (ref 0.0–1.2)
Total Protein: 7.3 g/dL (ref 6.5–8.1)

## 2023-10-06 LAB — URINALYSIS, MICROSCOPIC (REFLEX)

## 2023-10-06 LAB — TROPONIN I (HIGH SENSITIVITY)
Troponin I (High Sensitivity): 15 ng/L (ref <18)
Troponin I (High Sensitivity): 13 ng/L (ref <18)

## 2023-10-06 MED ORDER — ONDANSETRON HCL 4 MG/2ML IJ SOLN: 4.0000 mg | Freq: Four times a day (QID) | INTRAMUSCULAR | Status: DC | PRN

## 2023-10-06 MED ORDER — SODIUM CHLORIDE 0.9 % IV SOLN
1.0000 g | Freq: Once | INTRAVENOUS | Status: AC
  Administered 2023-10-06: 1 g via INTRAVENOUS
  Filled 2023-10-06: qty 10

## 2023-10-06 MED ORDER — ENOXAPARIN SODIUM 40 MG/0.4ML IJ SOSY
40.0000 mg | PREFILLED_SYRINGE | INTRAMUSCULAR | Status: DC
  Filled 2023-10-06: qty 0.4

## 2023-10-06 MED ORDER — IOHEXOL 300 MG/ML  SOLN
100.0000 mL | Freq: Once | INTRAMUSCULAR | Status: AC | PRN
  Administered 2023-10-06: 100 mL via INTRAVENOUS

## 2023-10-06 MED ORDER — ACETAMINOPHEN 650 MG RE SUPP: 650.0000 mg | Freq: Four times a day (QID) | RECTAL | Status: DC | PRN

## 2023-10-06 MED ORDER — SENNOSIDES-DOCUSATE SODIUM 8.6-50 MG PO TABS
1.0000 | ORAL_TABLET | Freq: Every evening | ORAL | Status: DC | PRN
  Administered 2023-10-09: 1 via ORAL

## 2023-10-06 MED ORDER — FUROSEMIDE 20 MG PO TABS
10.0000 mg | ORAL_TABLET | Freq: Two times a day (BID) | ORAL | Status: DC
  Filled 2023-10-06: qty 1

## 2023-10-06 MED ORDER — SODIUM CHLORIDE 0.9 % IV BOLUS
500.0000 mL | Freq: Once | INTRAVENOUS | Status: AC
  Administered 2023-10-06: 500 mL via INTRAVENOUS

## 2023-10-06 MED ORDER — ACETAMINOPHEN 325 MG PO TABS
650.0000 mg | ORAL_TABLET | Freq: Four times a day (QID) | ORAL | Status: DC | PRN
  Administered 2023-10-07 – 2023-10-09 (×3): 650 mg via ORAL
  Filled 2023-10-06 (×3): qty 2

## 2023-10-06 MED ORDER — METOPROLOL SUCCINATE ER 25 MG PO TB24
25.0000 mg | ORAL_TABLET | Freq: Every day | ORAL | Status: DC
  Administered 2023-10-06: 25 mg via ORAL
  Filled 2023-10-06 (×2): qty 1

## 2023-10-06 MED ORDER — APIXABAN 5 MG PO TABS
5.0000 mg | ORAL_TABLET | Freq: Two times a day (BID) | ORAL | Status: DC
  Administered 2023-10-06 – 2023-10-10 (×8): 5 mg via ORAL
  Filled 2023-10-06 (×8): qty 1

## 2023-10-06 MED ORDER — QUETIAPINE FUMARATE 25 MG PO TABS
25.0000 mg | ORAL_TABLET | Freq: Two times a day (BID) | ORAL | Status: DC
  Administered 2023-10-06 – 2023-10-07 (×3): 25 mg via ORAL
  Filled 2023-10-06 (×3): qty 1

## 2023-10-06 MED ORDER — DONEPEZIL HCL 5 MG PO TABS
5.0000 mg | ORAL_TABLET | Freq: Every day | ORAL | Status: DC
  Administered 2023-10-06 – 2023-10-09 (×4): 5 mg via ORAL
  Filled 2023-10-06 (×4): qty 1

## 2023-10-06 MED ORDER — ACETAMINOPHEN 325 MG PO TABS
650.0000 mg | ORAL_TABLET | Freq: Once | ORAL | Status: AC
  Administered 2023-10-06: 650 mg via ORAL
  Filled 2023-10-06: qty 2

## 2023-10-06 MED ORDER — LOSARTAN POTASSIUM 50 MG PO TABS
50.0000 mg | ORAL_TABLET | Freq: Every day | ORAL | Status: DC
  Filled 2023-10-06: qty 1

## 2023-10-06 MED ORDER — ONDANSETRON HCL 4 MG PO TABS: 4.0000 mg | ORAL_TABLET | Freq: Four times a day (QID) | ORAL | Status: DC | PRN

## 2023-10-06 MED ORDER — VITAMIN D 25 MCG (1000 UNIT) PO TABS
1000.0000 [IU] | ORAL_TABLET | Freq: Every day | ORAL | Status: DC
  Administered 2023-10-07 – 2023-10-10 (×4): 1000 [IU] via ORAL
  Filled 2023-10-06 (×4): qty 1

## 2023-10-06 MED ORDER — ONDANSETRON HCL 4 MG/2ML IJ SOLN
4.0000 mg | Freq: Once | INTRAMUSCULAR | Status: AC
  Administered 2023-10-06: 4 mg via INTRAVENOUS
  Filled 2023-10-06: qty 2

## 2023-10-06 NOTE — Progress Notes (Signed)
 Plan of Care Note for accepted transfer   Patient: Carol [name] MRN: [id number]   DOA: 10/06/2023  Facility requesting transfer: [facility]   Requesting Provider: Dr. Nicholaus   Reason for transfer: N/V, dizziness, fall   Facility course: 89 yr old female with HTN, HLD, dementia, breast cancer, CVA, and atrial fibrillation on Eliquis  presents with dizziness, nausea, and vomiting after an unwitnessed fall yesterday.  ED reports that history is difficult to obtain from the patient and it was queried whether she has mild TBI, UTI, vertigo, or presyncope.   She is mildly tachycardic in the ED, has essentially normal CMP, CBC, and troponin x 2.  Trauma imaging reveals nondisplaced sacral fracture.  There is a rare bacteria and small leukocytes on the UA.  Patient was given a liter of IV fluids, Zofran , acetaminophen , and Rocephin .  Plan of care: The patient is accepted for admission to Telemetry unit, at [facility].   Author: Evalene GORMAN Sprinkles, MD 10/06/2023  Check [email or web address] for on-call coverage.  Nursing staff, Please call [facility] Admits & Consults System-Wide number on Amion as soon as patient's arrival, so appropriate admitting provider can evaluate the pt.

## 2023-10-06 NOTE — H&P (Signed)
 History and Physical  Carol [name] FMW:[id number] DOB: 1928-01-21 DOA: 10/06/2023  PCP: Watt Harlene BROCKS, MD   Chief Complaint: Fall, dizziness and N/V  HPI: Carol [name] is a 89 y.o. female with medical history significant for atrial fibrillation on Eliquis , anemia, breast CA s/p remote double mastectomy, HTN, HLD, PSVT, CVA, dementia, bradycardia, mild cardiomyopathy (EF50-55% in 2024), and tricuspid regurgitation who presented to the ED after a fall at home. Patient currently resides in an independent living and had an unwitnessed fall yesterday. Patient tells me she was walking in her room when her feet tangled up. She was unable to find something to hold on so she fell hard on the floor. She started having some pain in her tailbone but was able to crawl out to the door to ask for help.  Family reports patient had short-term memory loss due to her dementia.  She fell in the hall after forgetting to use a walker.  EMS was called to assess patient.  Her vitals were stable but she refused to come to the ED. Pt reports some nausea and pain in her tailbone but has since improved but denies any vomiting, chest pain, dysuria, dizziness, headache, vision changes, palpitations, shortness of breath, cough, abdominal pain and vomiting. Of note, patient's family reports that patient complained of dizziness when she woke up in this morning and had 1 or 2 episodes of emesis.   Of note, patient's brother, Carol [name], states he is patient's power of attorney. He has papers at home and patient also has a signed DNR at home. I advised him to bring documents to the hospital to be placed on her chart.  ED Course: Initial vitals shows temp 97.3, RR 18, HR 95, BP 141/74, SpO2 94% on room air. Labs are significant for normal CMP, CBC and troponin.  Negative RSV, COVID and flu test.  UA shows negative nitrite, small leuks, WBC 11-20 and rare bacteria. Initial EKG showed sinus bradycardia with prolonged QTc of 508.   Repeat EKG shows sinus tach with rate of 113, and prolonged PR interval. CXR, CT head and CT cervical spine did not show any acute findings.  CT chest/abdomen/pelvis shows nondisplaced distal sacral fracture at the S4 level with edema in the presacral soft tissues.  Patient received IV NS 500 cc bolus x 2, IV Zofran  4 mg x 1, Tylenol  650 mg x 1, and IV Rocephin .  Patient was admitted to [facility] service and transferred to [facility].  Review of Systems: Please see HPI for pertinent positives and negatives. A complete 10 system review of systems are otherwise negative.  Past Medical History:  Diagnosis Date   Anemia    Blood transfusion without reported diagnosis    Breast cancer (HCC)    Cardiomyopathy (HCC)    Cataract    Dyslipidemia    Hyperlipidemia    Hypertension    Paroxysmal supraventricular tachycardia (HCC)    Permanent atrial fibrillation (HCC)    Stroke [facility])    Past Surgical History:  Procedure Laterality Date   MASTECTOMY     TONSILLECTOMY     WRIST SURGERY     Social History:  reports that she has never smoked. She has never used smokeless tobacco. She reports that she does not drink alcohol and does not use drugs.  Allergies  Allergen Reactions   Tramadol Other (See Comments)    seizure   Sertraline Other (See Comments)   Zoloft [Sertraline Hcl]     headache  Family History  Problem Relation Age of Onset   Epilepsy Mother    Stroke Father    Heart disease Maternal Aunt      Prior to Admission medications   Medication Sig Start Date End Date Taking? Authorizing Provider  ABRYSVO 120 MCG/0.5ML injection  07/06/22   [provider]  acetaminophen  (TYLENOL ) 500 MG tablet Take 500 mg by mouth every 8 (eight) hours as needed. 11/11/18   [provider]  Cholecalciferol  (VITAMIN D3) 1000 units CAPS Take by mouth.    [provider]  donepezil  (ARICEPT  ODT) 5 MG disintegrating tablet DISSOLVE 1 TABLET ON THE TONGUE AT BEDTIME 04/16/23    Copland, Harlene BROCKS, MD  ELIQUIS  5 MG TABS tablet TAKE 1 TABLET TWICE DAILY 04/09/23   Dunn, Dayna N, PA-C  furosemide  (LASIX ) 20 MG tablet TAKE 1/2 TABLET EVERY DAY 09/10/23   Copland, Jessica C, MD  losartan  (COZAAR ) 50 MG tablet Take 1 tablet (50 mg total) by mouth daily. 07/25/23   Carol Oneil BROCKS, MD  metoprolol  succinate (TOPROL -XL) 25 MG 24 hr tablet Take 1 tablet (25 mg total) by mouth daily. 04/24/23   Carol Oneil BROCKS, MD  predniSONE  (DELTASONE ) 20 MG tablet Take 40 mg by mouth daily for 3 days, then 20 mg by mouth daily for 3 days 02/05/23   Copland, Harlene BROCKS, MD  QUEtiapine  (SEROQUEL ) 25 MG tablet TAKE 1 TABLET BY MOUTH AT BEDTIME. INCREASE TO TWICE DAILY AS DIRECTED BY MD 05/14/23   Copland, Harlene BROCKS, MD    Physical Exam: BP 112/71 (BP Location: Right Arm)   Pulse (!) 116   Temp (!) 97.5 F (36.4 C) (Oral)   Resp 16   Ht 5' 2 (1.575 m)   Wt 64.4 kg   LMP  (LMP Unknown)   SpO2 94%   BMI 25.97 kg/m  General: Pleasant, chronically ill elderly woman laying in bed. No acute distress. HEENT: [place]/AT. Anicteric sclera CV: Tachycardic. Regular rhythm. No murmurs, rubs, or gallops. No LE edema Pulmonary: Lungs CTAB. Normal effort. No wheezing or rales. Abdominal: Soft, nontender, nondistended. Normal bowel sounds. MSK: Palpable radial and DP pulses. Mild left arm edema. Skin: Warm and dry. No obvious rash or lesions. Neuro: Alert and oriented to self, person and place but not to time. Moves all extremities. Normal sensation to light touch. No focal deficit. Psych: Normal mood and affect          Labs on Admission:  Basic Metabolic Panel: Recent Labs  Lab 10/06/23 1310  NA 136  K 3.9  CL 98  CO2 27  GLUCOSE 116*  BUN 14  CREATININE 0.75  CALCIUM 9.5   Liver Function Tests: Recent Labs  Lab 10/06/23 1310  AST 26  ALT 14  ALKPHOS 53  BILITOT 1.2  PROT 7.3  ALBUMIN 3.8   No results for input(s): LIPASE, AMYLASE in the last 168 hours. No results for input(s):  AMMONIA in the last 168 hours. CBC: Recent Labs  Lab 10/06/23 1310  WBC 6.5  NEUTROABS 4.0  HGB 13.4  HCT 40.2  MCV 90.3  PLT 221   Cardiac Enzymes: No results for input(s): CKTOTAL, CKMB, CKMBINDEX, TROPONINI in the last 168 hours. BNP (last 3 results) No results for input(s): BNP in the last 8760 hours.  ProBNP (last 3 results) No results for input(s): PROBNP in the last 8760 hours.  CBG: No results for input(s): GLUCAP in the last 168 hours.  Radiological Exams on Admission: CT CHEST ABDOMEN  PELVIS W CONTRAST Result Date: 10/06/2023 CLINICAL DATA:  Clemens yesterday, dizziness and nausea today, anticoagulated EXAM: CT CHEST, ABDOMEN, AND PELVIS WITH CONTRAST TECHNIQUE: Multidetector CT imaging of the chest, abdomen and pelvis was performed following the standard protocol during bolus administration of intravenous contrast. RADIATION DOSE REDUCTION: This exam was performed according to the departmental dose-optimization program which includes automated exposure control, adjustment of the mA and/or kV according to patient size and/or use of iterative reconstruction technique. CONTRAST:  OMNIPAQUE  IOHEXOL  300 MG/ML  SOLN COMPARISON:  09/11/2023 FINDINGS: CT CHEST FINDINGS Cardiovascular: Prominent biatrial dilatation, right greater than left. No pericardial effusion. Calcifications of the mitral and aortic valves. No evidence of thoracic aortic aneurysm or dissection. Atherosclerosis of the aorta and coronary vasculature. Mediastinum/Nodes: No enlarged mediastinal, hilar, or axillary lymph nodes. Thyroid  gland, trachea, and esophagus demonstrate no significant findings. Small hiatal hernia. Lungs/Pleura: The geographic areas of ground-glass attenuation within the lungs noted on prior study are again seen on this exam, favor small airway disease or hypoventilatory change. No dense consolidation, effusion, or pneumothorax. Central airways are patent. Musculoskeletal: No acute  or destructive bony abnormalities. Prior healed proximal right humeral fracture. Reconstructed images demonstrate no additional findings. CT ABDOMEN PELVIS FINDINGS Hepatobiliary: No hepatic injury or perihepatic hematoma. Gallbladder is unremarkable. Pancreas: Unremarkable. No pancreatic ductal dilatation or surrounding inflammatory changes. Spleen: No splenic injury or perisplenic hematoma. Adrenals/Urinary Tract: No adrenal hemorrhage or renal injury identified. Bladder is unremarkable. Stomach/Bowel: No bowel obstruction or ileus. Prior resection of the cecum with ileocolic anastomosis. There is diverticulosis throughout the distal colon with no evidence of acute diverticulitis. No bowel wall thickening or inflammatory change. Vascular/Lymphatic: Aortic atherosclerosis. No enlarged abdominal or pelvic lymph nodes. Reproductive: Uterus and bilateral adnexa are unremarkable. Other: No free fluid or free intraperitoneal gas. No abdominal wall hernia. Musculoskeletal: There is fat stranding within the presacral soft tissues. On the sagittal reconstructed images, there is subtle cortical discontinuity involving the ventral aspect of the distal sacrum at approximately the S4 level, consistent with nondisplaced fracture. No other acute bony abnormalities. Reconstructed images demonstrate no additional findings. IMPRESSION: 1. Nondisplaced distal sacral fracture at approximately the S4 level, with edema in the presacral soft tissues. 2. No other signs of acute intrathoracic, intra-abdominal, or intrapelvic trauma. 3. Distal colonic diverticulosis without diverticulitis. 4. Stable biatrial dilatation, right greater than left. 5. Stable geographic ground-glass attenuation within the lungs, favor small airway disease or hypoventilatory changes. Mild chronic edema could give a similar appearance. 6. Aortic Atherosclerosis (ICD10-I70.0). Coronary artery atherosclerosis. Electronically Signed   By: Ozell Daring M.D.   On:  10/06/2023 17:34   CT Cervical Spine Wo Contrast Result Date: 10/06/2023 CLINICAL DATA:  Clemens yesterday, dizziness and nausea EXAM: CT CERVICAL SPINE WITHOUT CONTRAST TECHNIQUE: Multidetector CT imaging of the cervical spine was performed without intravenous contrast. Multiplanar CT image reconstructions were also generated. RADIATION DOSE REDUCTION: This exam was performed according to the departmental dose-optimization program which includes automated exposure control, adjustment of the mA and/or kV according to patient size and/or use of iterative reconstruction technique. COMPARISON:  None Available. FINDINGS: Alignment: Alignment is anatomic. Skull base and vertebrae: No acute fracture. No primary bone lesion or focal pathologic process. Soft tissues and spinal canal: No prevertebral fluid or swelling. No visible canal hematoma. Disc levels: Mild spondylosis at C5-6 and C6-7. Mild facet hypertrophy from C3-4 through C7-T1. Upper chest: Airway is patent. Lung apices are clear. Atherosclerosis of the aortic arch. Other: Reconstructed images demonstrate no additional  findings. IMPRESSION: 1. No acute cervical spine fracture. 2. Lower cervical degenerative changes. 3.  Aortic Atherosclerosis (ICD10-I70.0). Electronically Signed   By: Ozell Daring M.D.   On: 10/06/2023 17:26   CT Head Wo Contrast Result Date: 10/06/2023 CLINICAL DATA:  Clemens yesterday, dizziness and nausea today EXAM: CT HEAD WITHOUT CONTRAST TECHNIQUE: Contiguous axial images were obtained from the base of the skull through the vertex without intravenous contrast. RADIATION DOSE REDUCTION: This exam was performed according to the departmental dose-optimization program which includes automated exposure control, adjustment of the mA and/or kV according to patient size and/or use of iterative reconstruction technique. COMPARISON:  11/11/2018 FINDINGS: Brain: Progressive confluent hypodensities throughout the periventricular and subcortical white  matter consistent with chronic small vessel ischemic changes. Chronic lacunar infarct again noted within the right basal ganglia. No signs of acute infarct or hemorrhage. Diffuse cerebral atrophy, with ex vacuo dilatation of the lateral ventricles. Remaining midline structures are unremarkable. No acute extra-axial fluid collections. No mass effect. Vascular: Atherosclerosis of the vertebral arteries and internal carotid arteries. No hyperdense vessel. Skull: Normal. Negative for fracture or focal lesion. Sinuses/Orbits: No acute finding. Other: None. IMPRESSION: 1. No acute intracranial process. 2. Extensive chronic small-vessel ischemic changes throughout the white matter and right basal ganglia as above. 3. Diffuse cerebral atrophy, with ex vacuo dilatation of the lateral ventricles. Electronically Signed   By: Ozell Daring M.D.   On: 10/06/2023 17:23   DG Chest Portable 1 View Result Date: 10/06/2023 CLINICAL DATA:  Dizzy with nausea.  Question pneumonia. EXAM: PORTABLE CHEST 1 VIEW COMPARISON:  09/10/2023 FINDINGS: The cardio pericardial silhouette is enlarged. The lungs are clear without focal pneumonia, edema, pneumothorax or pleural effusion. Interstitial markings are diffusely coarsened with chronic features. Surgical clips noted left axillary region. Telemetry leads overlie the chest. IMPRESSION: Chronic interstitial coarsening without acute cardiopulmonary findings. Electronically Signed   By: Camellia Candle M.D.   On: 10/06/2023 13:28   Assessment/Plan KSENIA KUNZ is a 89 y.o. female with medical history significant for atrial fibrillation on Eliquis , anemia, breast CA s/p remote double mastectomy, HTN, HLD, PSVT, CVA, dementia, bradycardia, mild cardiomyopathy (EF50-55% in 2024), and tricuspid regurgitation who presented to the ED after a fall at home.   # Fall # Generalized weakness # Nondisplaced sacral fracture Elderly dementia with history of A-fib on Eliquis  presented to the ED after  a fall at home yesterday in the setting of patient forgetting to use her walker. She had sustained a mild nondisplaced distal sacral fracture but no other fractures. She is not significantly tender on palpation of her tailbone.  -Admit to telemetry bed for observation -PT/OT eval -Fall precautions -Follow-up vitamin D  levels  # UTI Elderly patient presented after a fall with reported nausea and vomiting this morning.  UA shows possible signs of infection.  Imaging does not show any acute intra-abdominal abnormalities. She denies any urinary symptoms.  Due to her history of dementia, short-term memory and reported nausea and vomiting today, will treat and follow-up urine culture. -Continue IV Rocephin  -Follow-up urine culture -Trend CBC, fever curve  # A-fib Patient currently in normal sinus. Initial EKG showed sinus bradycardia but repeat now shows sinus tach. HR persistently in the 110s. -Continue Eliquis  and Toprol -XL -Trend and replete electrolytes -Telemetry  # Cardiomyopathy # Tricuspid regurg Last TTE in May 2024 showed EF 50 to 85%, moderately elevated PASP, moderately dilated RA, severe TVR, moderate AVR and mild MVR. Patient euvolemic on exam. -Continue Toprol  XL and  low-dose Lasix   # Dementia Patient oriented to self, person and place but not to time at baseline.  She is currently at her baseline mental status.  She does have short-term memory loss. -Continue Aricept  and Seroquel  -Check vitamin B12 levels  # HTN BP stable with SBP in the 120s to 140s -Continue losartan  and Toprol  XL  DVT prophylaxis: Eliquis     Code Status: Limited: Do not attempt resuscitation (DNR) -DNR-LIMITED -Do Not Intubate/DNI   Consults called: None  Family Communication: Discussed admission with brother, POA, on the phone  Severity of Illness: The appropriate patient status for this patient is OBSERVATION. Observation status is judged to be reasonable and necessary in order to provide the  required intensity of service to ensure the patient's safety. The patient's presenting symptoms, physical exam findings, and initial radiographic and laboratory data in the context of their medical condition is felt to place them at decreased risk for further clinical deterioration. Furthermore, it is anticipated that the patient will be medically stable for discharge from the hospital within 2 midnights of admission.   Level of care: Telemetry   Lou Claretta HERO, MD 10/06/2023, 9:41 PM [facility] Pager: [phone] Isaiah 41:10   If 7PM-7AM, please contact night-coverage [email or web address] Password [email or web address]

## 2023-10-06 NOTE — ED Notes (Signed)
Fall risk sign on door Fall risk armband Fall risk socks

## 2023-10-06 NOTE — ED Notes (Signed)
 [facility] called for transport.

## 2023-10-06 NOTE — ED Provider Notes (Signed)
 [place] [facility] Provider Note   CSN: [id number] Arrival date & time: 10/06/23  1205     History  Chief Complaint  Patient presents with   Dizziness    Carol [name] is a 89 y.o. female.  With a history of atrial fibrillation on Eliquis , dementia and hypertension who presents to ED for nausea and vomiting.  Patient is a resident of an assisted living facility and has health aides available throughout the day.  Last night she was walking with her walker when she suffered an unwitnessed fall.  She was not evaluated in the emergency department at that time.  Today she woke up around 0700 and complained of dizziness nausea and vomited a couple times.  She is unable to describe the details before yesterday secondary to dementia.  Husband reports she is at her normal neurologic baseline.  No fevers, chills, respiratory distress chest pain or abdominal pain.   Dizziness      Home Medications Prior to Admission medications   Medication Sig Start Date End Date Taking? Authorizing Provider  ABRYSVO 120 MCG/0.5ML injection  07/06/22   [provider]  acetaminophen  (TYLENOL ) 500 MG tablet Take 500 mg by mouth every 8 (eight) hours as needed. 11/11/18   [provider]  Cholecalciferol  (VITAMIN D3) 1000 units CAPS Take by mouth.    [provider]  donepezil  (ARICEPT  ODT) 5 MG disintegrating tablet DISSOLVE 1 TABLET ON THE TONGUE AT BEDTIME 04/16/23   Copland, Harlene BROCKS, MD  ELIQUIS  5 MG TABS tablet TAKE 1 TABLET TWICE DAILY 04/09/23   Dunn, Dayna N, PA-C  furosemide  (LASIX ) 20 MG tablet TAKE 1/2 TABLET EVERY DAY 09/10/23   Copland, Jessica C, MD  losartan  (COZAAR ) 50 MG tablet Take 1 tablet (50 mg total) by mouth daily. 07/25/23   Jeffrie Oneil BROCKS, MD  metoprolol  succinate (TOPROL -XL) 25 MG 24 hr tablet Take 1 tablet (25 mg total) by mouth daily. 04/24/23   Jeffrie Oneil BROCKS, MD  predniSONE  (DELTASONE ) 20 MG tablet Take 40 mg by mouth  daily for 3 days, then 20 mg by mouth daily for 3 days 02/05/23   Copland, Harlene BROCKS, MD  QUEtiapine  (SEROQUEL ) 25 MG tablet TAKE 1 TABLET BY MOUTH AT BEDTIME. INCREASE TO TWICE DAILY AS DIRECTED BY MD 05/14/23   Copland, Harlene BROCKS, MD      Allergies    Tramadol, Sertraline, and Zoloft [sertraline hcl]    Review of Systems   Review of Systems  Neurological:  Positive for dizziness.    Physical Exam Updated Vital Signs BP (!) 130/50   Pulse 68   Temp (!) 97.3 F (36.3 C)   Resp 14   Ht 5' 2 (1.575 m)   Wt 64.4 kg   LMP  (LMP Unknown)   SpO2 96%   BMI 25.97 kg/m  Physical Exam Vitals and nursing note reviewed.  HENT:     Head: Normocephalic and atraumatic.  Eyes:     Pupils: Pupils are equal, round, and reactive to light.  Cardiovascular:     Rate and Rhythm: Normal rate and regular rhythm.  Pulmonary:     Effort: Pulmonary effort is normal.     Breath sounds: Normal breath sounds.  Abdominal:     Palpations: Abdomen is soft.     Tenderness: There is no abdominal tenderness.  Musculoskeletal:     Cervical back: Neck supple. No tenderness.  Skin:    General: Skin is warm and  dry.  Neurological:     General: No focal deficit present.     Mental Status: She is alert.     Sensory: No sensory deficit.     Motor: No weakness.     Comments: Oriented to self only  Psychiatric:        Mood and Affect: Mood normal.     ED Results / Procedures / Treatments   Labs (all labs ordered are listed, but only abnormal results are displayed) Labs Reviewed  COMPREHENSIVE METABOLIC PANEL - Abnormal; Notable for the following components:      Result Value   Glucose, Bld 116 (*)    All other components within normal limits  URINALYSIS, ROUTINE W REFLEX MICROSCOPIC - Abnormal; Notable for the following components:   Leukocytes,Ua SMALL (*)    All other components within normal limits  URINALYSIS, MICROSCOPIC (REFLEX) - Abnormal; Notable for the following components:   Bacteria,  UA RARE (*)    All other components within normal limits  RESP PANEL BY RT-PCR (RSV, FLU A&B, COVID)  [id number]  CBC WITH DIFFERENTIAL/PLATELET  TROPONIN I (HIGH SENSITIVITY)  TROPONIN I (HIGH SENSITIVITY)    EKG None  Radiology DG Chest Portable 1 View Result Date: 10/06/2023 CLINICAL DATA:  Dizzy with nausea.  Question pneumonia. EXAM: PORTABLE CHEST 1 VIEW COMPARISON:  09/10/2023 FINDINGS: The cardio pericardial silhouette is enlarged. The lungs are clear without focal pneumonia, edema, pneumothorax or pleural effusion. Interstitial markings are diffusely coarsened with chronic features. Surgical clips noted left axillary region. Telemetry leads overlie the chest. IMPRESSION: Chronic interstitial coarsening without acute cardiopulmonary findings. Electronically Signed   By: Camellia Candle M.D.   On: 10/06/2023 13:28    Procedures Procedures    Medications Ordered in ED Medications  iohexol  (OMNIPAQUE ) 300 MG/ML solution 100 mL (has no administration in time range)  sodium chloride  0.9 % bolus 500 mL (500 mLs Intravenous New Bag/Given 10/06/23 1338)  ondansetron  (ZOFRAN ) injection 4 mg (4 mg Intravenous Given 10/06/23 1523)    ED Course/ Medical Decision Making/ A&P Clinical Course as of 10/06/23 1622  Sat Oct 06, 2023  1510 Hx dementia, afib on Eliquis , in independent living, unwitnessed fall yesterday, dizzy and vomiting today, getting scanned, getting urine, is at her baseline, if can walk and workup okay could likely go back [JD]  1540 I, Ozell Marine DO, am transitioning care of this patient to the oncoming provider pending CTs, UA, reevaluation and disposition [MP]    Clinical Course User Index [JD] Davis, Jonathon H, MD [MP] Marine Ozell LABOR, DO                                 Medical Decision Making 89 year old female with history as above presenting for nausea and vomiting.  Fell yesterday.  Unwitnessed.  She is on Eliquis .  Unable to tell some of the details of the fall.   Still reporting nausea and vomiting with some dizziness earlier.  Differential diagnosis includes intracranial hemorrhage, concussive syndrome, viral illness, dysrhythmia UTI, metabolic disturbance and posterior stroke.  Lower suspicion for posterior stroke given benign neurologic exam no active dizziness.  Will obtain CT pan scan to evaluate for traumatic injury, EKG and laboratory workup including CBC metabolic panel UA and viral respiratory swab.  Will provide IV fluids for rehydration and Zofran  for nausea and vomiting  Amount and/or Complexity of Data Reviewed Labs: ordered. Radiology: ordered.  Risk Prescription drug management.  Final Clinical Impression(s) / ED Diagnoses Final diagnoses:  Dizziness  Nausea and vomiting, unspecified vomiting type  Fall in elderly patient    Rx / DC Orders ED Discharge Orders     None         Pamella Ozell LABOR, DO 10/06/23 1622

## 2023-10-06 NOTE — ED Triage Notes (Signed)
 Per brother, pt fell yesterday at independent living facility; woke up this morning feeling dizzy and nauseated; pt normally gets up around 0700, but is not sure what time she woke up today; went to bed between 2230 and 2300; takes Eliquis , not sure if she hit head

## 2023-10-06 NOTE — ED Provider Notes (Signed)
  Physical Exam  BP (!) 144/86   Pulse 68   Temp (!) 97.3 F (36.3 C)   Resp 20   Ht 5' 2 (1.575 m)   Wt 64.4 kg   LMP  (LMP Unknown)   SpO2 96%   BMI 25.97 kg/m   Physical Exam  Procedures  Procedures  ED Course / MDM   Clinical Course as of 10/06/23 2345  Sat Oct 06, 2023  1510 Hx dementia, afib on Eliquis , in independent living, unwitnessed fall yesterday, dizzy and vomiting today, getting scanned, getting urine, is at her baseline, if can walk and workup okay could likely go back [JD]  1540 I, Ozell Marine DO, am transitioning care of this patient to the oncoming provider pending CTs, UA, reevaluation and disposition [MP]  1738 CT scan with nondisplaced sacral fracture. [JD]  1803 Brother: independent living, fell yesterday afternoon.  Supposed to use a walker.  Came in today due to dizziness.  She did get her medicine in the morning, including metoprolol . [JD]    Clinical Course User Index [JD] Zakhai Meisinger H, MD [MP] Marine Ozell LABOR, DO   Medical Decision Making Amount and/or Complexity of Data Reviewed Labs: ordered. Radiology: ordered.  Risk OTC drugs. Prescription drug management. Decision regarding hospitalization.   Handoff received from prior provider with plan to follow-up CT scans and potentially discharge.  Patient is a history of dementia, atrial fibrillation on Eliquis , hypertension.  She had an unwitnessed fall earlier today and has had some nausea and vomiting and dizziness.  Reportedly is at her baseline mental status.  On my reassessment patient is now tachycardic into the 1 teens.  Her EKG shows sinus tachycardia with some diffuse ST depression and slight elevation in aVR.  She has no chest pain or shortness of breath.  Could be rate related but is different from her prior EKG.  Her scans are pending.  Urinalysis concerning for possible UTI.  No leukocytosis, blood pressure stable.  Antibiotics ordered for UTI.  On exam she has no shortness of  breath or chest pain.  Denies any pain.  She does have a sacral fracture but no other acute findings on her imaging.  Unclear if she took her metoprolol .  Working to see if she missed her metoprolol  which could cause her tachycardia.  Patient did receive her medications today per brother over the phone.  Given persistent tachycardia, UTI, as well as sacral fracture I discussed the patient with the hospitalist and she was admitted for further management.      Nicholaus Cassondra DEL, MD 10/06/23 [phone]

## 2023-10-07 ENCOUNTER — Encounter: Payer: Self-pay | Admitting: Family Medicine

## 2023-10-07 DIAGNOSIS — N3000 Acute cystitis without hematuria: Secondary | ICD-10-CM | POA: Diagnosis not present

## 2023-10-07 DIAGNOSIS — W19XXXA Unspecified fall, initial encounter: Secondary | ICD-10-CM | POA: Diagnosis not present

## 2023-10-07 DIAGNOSIS — R531 Weakness: Secondary | ICD-10-CM

## 2023-10-07 DIAGNOSIS — Y92009 Unspecified place in unspecified non-institutional (private) residence as the place of occurrence of the external cause: Secondary | ICD-10-CM | POA: Diagnosis not present

## 2023-10-07 LAB — BASIC METABOLIC PANEL
Anion gap: 12 (ref 5–15)
BUN: 16 mg/dL (ref 8–23)
CO2: 25 mmol/L (ref 22–32)
Calcium: 8.8 mg/dL — ABNORMAL LOW (ref 8.9–10.3)
Chloride: 103 mmol/L (ref 98–111)
Creatinine, Ser: 0.76 mg/dL (ref 0.44–1.00)
GFR, Estimated: >60 mL/min (ref >60)
Glucose, Bld: 79 mg/dL (ref 70–99)
Potassium: 3.5 mmol/L (ref 3.5–5.1)
Sodium: 140 mmol/L (ref 135–145)

## 2023-10-07 LAB — CBC
HCT: 37.2 % (ref 36.0–46.0)
Hemoglobin: 11.7 g/dL — ABNORMAL LOW (ref 12.0–15.0)
MCH: 29.4 pg (ref 26.0–34.0)
MCHC: 31.5 g/dL (ref 30.0–36.0)
MCV: 93.5 fL (ref 80.0–100.0)
Platelets: 196 10*3/uL (ref 150–400)
RBC: 3.98 MIL/uL (ref 3.87–5.11)
RDW: 14.4 % (ref 11.5–15.5)
WBC: 6.8 10*3/uL (ref 4.0–10.5)
nRBC: 0 % (ref 0.0–0.2)

## 2023-10-07 LAB — PHOSPHORUS: Phosphorus: 3.5 mg/dL (ref 2.5–4.6)

## 2023-10-07 LAB — VITAMIN D 25 HYDROXY (VIT D DEFICIENCY, FRACTURES): Vit D, 25-Hydroxy: 34.54 ng/mL (ref 30–100)

## 2023-10-07 LAB — VITAMIN B12: Vitamin B-12: 150 pg/mL — ABNORMAL LOW (ref 180–914)

## 2023-10-07 LAB — MAGNESIUM: Magnesium: 1.9 mg/dL (ref 1.7–2.4)

## 2023-10-07 MED ORDER — METOPROLOL SUCCINATE ER 25 MG PO TB24
25.0000 mg | ORAL_TABLET | Freq: Every day | ORAL | Status: DC
  Administered 2023-10-07: 25 mg via ORAL
  Filled 2023-10-07: qty 1

## 2023-10-07 MED ORDER — CYANOCOBALAMIN 1000 MCG/ML IJ SOLN
1000.0000 ug | Freq: Once | INTRAMUSCULAR | Status: AC
  Administered 2023-10-07: 1000 ug via INTRAMUSCULAR
  Filled 2023-10-07: qty 1

## 2023-10-07 MED ORDER — VITAMIN B-12 1000 MCG PO TABS
1000.0000 ug | ORAL_TABLET | Freq: Every day | ORAL | Status: DC
  Administered 2023-10-08 – 2023-10-10 (×3): 1000 ug via ORAL
  Filled 2023-10-07 (×3): qty 1

## 2023-10-07 NOTE — Progress Notes (Signed)
 PROGRESS NOTE  Carol [name]  FMW:[id number] DOB: April 17, 1928 DOA: 10/06/2023 PCP: Watt Harlene BROCKS, MD   Brief Narrative: Patient is a 72 female with history of paroxysmal A-fib, breast cancer status post double mastectomy, hypertension, hyperlipidemia, CVA, dementia tricuspid valve regurgitation who presented to the Emergency Department after she fell at independent living facility followed by pain on her tailbone.  On presentation, she was hemodynamically stable.  Chest x-ray, CT head, CT cervical spine did not show any acute findings.  CT chest/abdomen/pelvis showed nondisplaced distal sacral fracture at S4 level with edema in the presacral soft tissues.  Admitted for pain management, PT evaluation.  UA suspicious for UTI.  Started on ceftriaxone .  Assessment & Plan:  Principal Problem:   Fall at home, initial encounter Active Problems:   Nausea & vomiting   Pressure injury of skin   Acute cystitis without hematuria   Generalized weakness  Falls/generalized weakness/nondisplaced sacral fracture: Presented with unwitnessed fall at independent living facility.  Forgot to use her walker.  Imaging showed mild nondisplaced distal sacral fracture at S4 level with edema in the presacral soft tissues.  Currently pain well-controlled.  PT/OT evaluation pending  Suspected UTI: Denies dysuria.  No fever or leukocytosis.  UA suspicious for UTI.  Started on ceftriaxone , follow-up urine culture.  Low threshold to discontinue antibiotic  Paroxysmal A-fib: Currently in normal sinus rhythm.  On Eliquis  and Toprol .  Monitor on telemetry  History of cardiomyopathy/tricuspid valve regurgitation: Last echo done on May last year showed EF of 50 to 55%, moderate pulmonary artery hypertension, severe TVR, moderate AVR.  Currently on Lasix  low-dose and Toprol .  We recommend follow-up with cardiology as an outpatient  Dementia: Confused to time.  Oriented to person and place.  Currently at baseline.  Continue  delirium precaution, frequent reorientation.  Continue Aricept , Seroquel   Low vitamin B12: Will give a IM dose, followed by daily  oral supplementation  Hypertension: BP soft..  On losartan  and Toprol  at home, currently on hold       Pressure Injury 10/06/23 Buttocks Left Stage 1 -  Intact skin with non-blanchable redness of a localized area usually over a bony prominence. (Active)  10/06/23 2100  Location: Buttocks  Location Orientation: Left  Staging: Stage 1 -  Intact skin with non-blanchable redness of a localized area usually over a bony prominence.  Wound Description (Comments):   Present on Admission: Yes  Dressing Type Foam - Lift dressing to assess site every shift 10/06/23 2100     Pressure Injury 10/06/23 Buttocks Right Stage 1 -  Intact skin with non-blanchable redness of a localized area usually over a bony prominence. (Active)  10/06/23 2100  Location: Buttocks  Location Orientation: Right  Staging: Stage 1 -  Intact skin with non-blanchable redness of a localized area usually over a bony prominence.  Wound Description (Comments):   Present on Admission: Yes  Dressing Type Foam - Lift dressing to assess site every shift 10/06/23 2100    DVT prophylaxis: apixaban  (ELIQUIS ) tablet 5 mg     Code Status: Limited: Do not attempt resuscitation (DNR) -DNR-LIMITED -Do Not Intubate/DNI   Family Communication: Discussed with brother at bedside  Patient status: Inpatient  Patient is from : ILF  Anticipated discharge to:ILF vs [facility]  Estimated DC date:1-2 days   Consultants: None  Procedures:None  Antimicrobials:  Anti-infectives (From admission, onward)    Start     Dose/Rate Route Frequency Ordered Stop   10/06/23 1745  cefTRIAXone  (ROCEPHIN ) 1 g  in sodium chloride  0.9 % 100 mL IVPB        1 g 200 mL/hr over 30 Minutes Intravenous  Once 10/06/23 1737 10/06/23 1941       Subjective: Patient seen and examined at bedside today.  Very comfortable.  Lying in  bed.  Brother at bedside.  Pain well-controlled.  Denied any complaints.  Alert and awake, oriented to place but not time  Objective: Vitals:   10/07/23 0000 10/07/23 0217 10/07/23 0250 10/07/23 0453  BP:  122/63  (!) 92/44  Pulse:  (!) 109  (!) 58  Resp: 15 16 20 18   Temp:  98.2 F (36.8 C)  97.7 F (36.5 C)  TempSrc:  Oral  Oral  SpO2:  95%  96%  Weight:      Height:        Intake/Output Summary (Last 24 hours) at 10/07/2023 0744 Last data filed at 10/07/2023 0215 Gross per 24 hour  Intake 1100 ml  Output 500 ml  Net 600 ml   Filed Weights   10/06/23 1217 10/06/23 2200  Weight: 64.4 kg 65.1 kg    Examination:  General exam: Overall comfortable, not in distress, pleasant elderly female HEENT: PERRL Respiratory system:  no wheezes or crackles  Cardiovascular system: S1 & S2 heard, RRR.  Gastrointestinal system: Abdomen is nondistended, soft and nontender. Central nervous system: Alert and awake,oriented to place only  Extremities: No edema, no clubbing ,no cyanosis Skin: No rashes, no ulcers,no icterus     Data Reviewed: I have personally reviewed following labs and imaging studies  CBC: Recent Labs  Lab 10/06/23 1310  WBC 6.5  NEUTROABS 4.0  HGB 13.4  HCT 40.2  MCV 90.3  PLT 221   Basic Metabolic Panel: Recent Labs  Lab 10/06/23 1310  NA 136  K 3.9  CL 98  CO2 27  GLUCOSE 116*  BUN 14  CREATININE 0.75  CALCIUM 9.5     Recent Results (from the past 240 hours)  Resp panel by RT-PCR (RSV, Flu A&B, Covid) Anterior Nasal Swab     Status: None   Collection Time: 10/06/23  1:10 PM   Specimen: Anterior Nasal Swab  Result Value Ref Range Status   SARS Coronavirus 2 by RT PCR NEGATIVE NEGATIVE Final    Comment: (NOTE) SARS-CoV-2 target nucleic acids are NOT DETECTED.  The SARS-CoV-2 RNA is generally detectable in upper respiratory specimens during the acute phase of infection. The lowest concentration of SARS-CoV-2 viral copies this assay can detect  is 138 copies/mL. A negative result does not preclude SARS-Cov-2 infection and should not be used as the sole basis for treatment or other patient management decisions. A negative result may occur with  improper specimen collection/handling, submission of specimen other than nasopharyngeal swab, presence of viral mutation(s) within the areas targeted by this assay, and inadequate number of viral copies(<138 copies/mL). A negative result must be combined with clinical observations, patient history, and epidemiological information. The expected result is Negative.  Fact Sheet for Patients:  bloggercourse.com  Fact Sheet for Healthcare Providers:  seriousbroker.it  This test is no t yet approved or cleared by the [place]  FDA and  has been authorized for detection and/or diagnosis of SARS-CoV-2 by FDA under an Emergency Use Authorization (EUA). This EUA will remain  in effect (meaning this test can be used) for the duration of the COVID-19 declaration under Section 564(b)(1) of the Act, 21 U.S.C.section 360bbb-3(b)(1), unless the authorization is terminated  or revoked sooner.  Influenza A by PCR NEGATIVE NEGATIVE Final   Influenza B by PCR NEGATIVE NEGATIVE Final    Comment: (NOTE) The Xpert Xpress SARS-CoV-2/FLU/RSV plus assay is intended as an aid in the diagnosis of influenza from Nasopharyngeal swab specimens and should not be used as a sole basis for treatment. Nasal washings and aspirates are unacceptable for Xpert Xpress SARS-CoV-2/FLU/RSV testing.  Fact Sheet for Patients: bloggercourse.com  Fact Sheet for Healthcare Providers: seriousbroker.it  This test is not yet approved or cleared by the [place]  FDA and has been authorized for detection and/or diagnosis of SARS-CoV-2 by FDA under an Emergency Use Authorization (EUA). This EUA will remain in effect  (meaning this test can be used) for the duration of the COVID-19 declaration under Section 564(b)(1) of the Act, 21 U.S.C. section 360bbb-3(b)(1), unless the authorization is terminated or revoked.     Resp Syncytial Virus by PCR NEGATIVE NEGATIVE Final    Comment: (NOTE) Fact Sheet for Patients: bloggercourse.com  Fact Sheet for Healthcare Providers: seriousbroker.it  This test is not yet approved or cleared by the [place]  FDA and has been authorized for detection and/or diagnosis of SARS-CoV-2 by FDA under an Emergency Use Authorization (EUA). This EUA will remain in effect (meaning this test can be used) for the duration of the COVID-19 declaration under Section 564(b)(1) of the Act, 21 U.S.C. section 360bbb-3(b)(1), unless the authorization is terminated or revoked.  Performed at [facility], [address] [place], [place] [address]      Radiology Studies: CT CHEST ABDOMEN PELVIS W CONTRAST Result Date: 10/06/2023 CLINICAL DATA:  Clemens yesterday, dizziness and nausea today, anticoagulated EXAM: CT CHEST, ABDOMEN, AND PELVIS WITH CONTRAST TECHNIQUE: Multidetector CT imaging of the chest, abdomen and pelvis was performed following the standard protocol during bolus administration of intravenous contrast. RADIATION DOSE REDUCTION: This exam was performed according to the departmental dose-optimization program which includes automated exposure control, adjustment of the mA and/or kV according to patient size and/or use of iterative reconstruction technique. CONTRAST:  OMNIPAQUE  IOHEXOL  300 MG/ML  SOLN COMPARISON:  09/11/2023 FINDINGS: CT CHEST FINDINGS Cardiovascular: Prominent biatrial dilatation, right greater than left. No pericardial effusion. Calcifications of the mitral and aortic valves. No evidence of thoracic aortic aneurysm or dissection. Atherosclerosis of the aorta and coronary vasculature.  Mediastinum/Nodes: No enlarged mediastinal, hilar, or axillary lymph nodes. Thyroid  gland, trachea, and esophagus demonstrate no significant findings. Small hiatal hernia. Lungs/Pleura: The geographic areas of ground-glass attenuation within the lungs noted on prior study are again seen on this exam, favor small airway disease or hypoventilatory change. No dense consolidation, effusion, or pneumothorax. Central airways are patent. Musculoskeletal: No acute or destructive bony abnormalities. Prior healed proximal right humeral fracture. Reconstructed images demonstrate no additional findings. CT ABDOMEN PELVIS FINDINGS Hepatobiliary: No hepatic injury or perihepatic hematoma. Gallbladder is unremarkable. Pancreas: Unremarkable. No pancreatic ductal dilatation or surrounding inflammatory changes. Spleen: No splenic injury or perisplenic hematoma. Adrenals/Urinary Tract: No adrenal hemorrhage or renal injury identified. Bladder is unremarkable. Stomach/Bowel: No bowel obstruction or ileus. Prior resection of the cecum with ileocolic anastomosis. There is diverticulosis throughout the distal colon with no evidence of acute diverticulitis. No bowel wall thickening or inflammatory change. Vascular/Lymphatic: Aortic atherosclerosis. No enlarged abdominal or pelvic lymph nodes. Reproductive: Uterus and bilateral adnexa are unremarkable. Other: No free fluid or free intraperitoneal gas. No abdominal wall hernia. Musculoskeletal: There is fat stranding within the presacral soft tissues. On the sagittal reconstructed images, there is subtle cortical discontinuity  involving the ventral aspect of the distal sacrum at approximately the S4 level, consistent with nondisplaced fracture. No other acute bony abnormalities. Reconstructed images demonstrate no additional findings. IMPRESSION: 1. Nondisplaced distal sacral fracture at approximately the S4 level, with edema in the presacral soft tissues. 2. No other signs of acute  intrathoracic, intra-abdominal, or intrapelvic trauma. 3. Distal colonic diverticulosis without diverticulitis. 4. Stable biatrial dilatation, right greater than left. 5. Stable geographic ground-glass attenuation within the lungs, favor small airway disease or hypoventilatory changes. Mild chronic edema could give a similar appearance. 6. Aortic Atherosclerosis (ICD10-I70.0). Coronary artery atherosclerosis. Electronically Signed   By: Ozell Daring M.D.   On: 10/06/2023 17:34   CT Cervical Spine Wo Contrast Result Date: 10/06/2023 CLINICAL DATA:  Clemens yesterday, dizziness and nausea EXAM: CT CERVICAL SPINE WITHOUT CONTRAST TECHNIQUE: Multidetector CT imaging of the cervical spine was performed without intravenous contrast. Multiplanar CT image reconstructions were also generated. RADIATION DOSE REDUCTION: This exam was performed according to the departmental dose-optimization program which includes automated exposure control, adjustment of the mA and/or kV according to patient size and/or use of iterative reconstruction technique. COMPARISON:  None Available. FINDINGS: Alignment: Alignment is anatomic. Skull base and vertebrae: No acute fracture. No primary bone lesion or focal pathologic process. Soft tissues and spinal canal: No prevertebral fluid or swelling. No visible canal hematoma. Disc levels: Mild spondylosis at C5-6 and C6-7. Mild facet hypertrophy from C3-4 through C7-T1. Upper chest: Airway is patent. Lung apices are clear. Atherosclerosis of the aortic arch. Other: Reconstructed images demonstrate no additional findings. IMPRESSION: 1. No acute cervical spine fracture. 2. Lower cervical degenerative changes. 3.  Aortic Atherosclerosis (ICD10-I70.0). Electronically Signed   By: Ozell Daring M.D.   On: 10/06/2023 17:26   CT Head Wo Contrast Result Date: 10/06/2023 CLINICAL DATA:  Clemens yesterday, dizziness and nausea today EXAM: CT HEAD WITHOUT CONTRAST TECHNIQUE: Contiguous axial images were  obtained from the base of the skull through the vertex without intravenous contrast. RADIATION DOSE REDUCTION: This exam was performed according to the departmental dose-optimization program which includes automated exposure control, adjustment of the mA and/or kV according to patient size and/or use of iterative reconstruction technique. COMPARISON:  11/11/2018 FINDINGS: Brain: Progressive confluent hypodensities throughout the periventricular and subcortical white matter consistent with chronic small vessel ischemic changes. Chronic lacunar infarct again noted within the right basal ganglia. No signs of acute infarct or hemorrhage. Diffuse cerebral atrophy, with ex vacuo dilatation of the lateral ventricles. Remaining midline structures are unremarkable. No acute extra-axial fluid collections. No mass effect. Vascular: Atherosclerosis of the vertebral arteries and internal carotid arteries. No hyperdense vessel. Skull: Normal. Negative for fracture or focal lesion. Sinuses/Orbits: No acute finding. Other: None. IMPRESSION: 1. No acute intracranial process. 2. Extensive chronic small-vessel ischemic changes throughout the white matter and right basal ganglia as above. 3. Diffuse cerebral atrophy, with ex vacuo dilatation of the lateral ventricles. Electronically Signed   By: Ozell Daring M.D.   On: 10/06/2023 17:23   DG Chest Portable 1 View Result Date: 10/06/2023 CLINICAL DATA:  Dizzy with nausea.  Question pneumonia. EXAM: PORTABLE CHEST 1 VIEW COMPARISON:  09/10/2023 FINDINGS: The cardio pericardial silhouette is enlarged. The lungs are clear without focal pneumonia, edema, pneumothorax or pleural effusion. Interstitial markings are diffusely coarsened with chronic features. Surgical clips noted left axillary region. Telemetry leads overlie the chest. IMPRESSION: Chronic interstitial coarsening without acute cardiopulmonary findings. Electronically Signed   By: Camellia Candle M.D.   On: 10/06/2023 13:28  Scheduled Meds:  apixaban   5 mg Oral BID   cholecalciferol   1,000 Units Oral Daily   donepezil   5 mg Oral QHS   furosemide   10 mg Oral BID   losartan   50 mg Oral Daily   metoprolol  succinate  25 mg Oral Daily   QUEtiapine   25 mg Oral BID   Continuous Infusions:   LOS: 0 days   Ivonne Mustache, MD [facility] P2/04/2024, 7:44 AM  PROGRESS NOTE  Carol [name]  FMW:[id number] DOB: 05-11-1928 DOA: 10/06/2023 PCP: Watt Harlene BROCKS, MD   Brief Narrative:   Assessment & Plan:  Principal Problem:   Fall at home, initial encounter Active Problems:   Nausea & vomiting   Pressure injury of skin   Acute cystitis without hematuria   Generalized weakness        Pressure Injury 10/06/23 Buttocks Left Stage 1 -  Intact skin with non-blanchable redness of a localized area usually over a bony prominence. (Active)  10/06/23 2100  Location: Buttocks  Location Orientation: Left  Staging: Stage 1 -  Intact skin with non-blanchable redness of a localized area usually over a bony prominence.  Wound Description (Comments):   Present on Admission: Yes  Dressing Type Foam - Lift dressing to assess site every shift 10/06/23 2100     Pressure Injury 10/06/23 Buttocks Right Stage 1 -  Intact skin with non-blanchable redness of a localized area usually over a bony prominence. (Active)  10/06/23 2100  Location: Buttocks  Location Orientation: Right  Staging: Stage 1 -  Intact skin with non-blanchable redness of a localized area usually over a bony prominence.  Wound Description (Comments):   Present on Admission: Yes  Dressing Type Foam - Lift dressing to assess site every shift 10/06/23 2100    DVT prophylaxis: apixaban  (ELIQUIS ) tablet 5 mg     Code Status: Limited: Do not attempt resuscitation (DNR) -DNR-LIMITED -Do Not Intubate/DNI   Family Communication:   Patient status:  Patient is from :  Anticipated discharge to:  Estimated DC date:   Consultants:    Procedures:  Antimicrobials:  Anti-infectives (From admission, onward)    Start     Dose/Rate Route Frequency Ordered Stop   10/06/23 1745  cefTRIAXone  (ROCEPHIN ) 1 g in sodium chloride  0.9 % 100 mL IVPB        1 g 200 mL/hr over 30 Minutes Intravenous  Once 10/06/23 1737 10/06/23 1941       Subjective:   Objective: Vitals:   10/07/23 0000 10/07/23 0217 10/07/23 0250 10/07/23 0453  BP:  122/63  (!) 92/44  Pulse:  (!) 109  (!) 58  Resp: 15 16 20 18   Temp:  98.2 F (36.8 C)  97.7 F (36.5 C)  TempSrc:  Oral  Oral  SpO2:  95%  96%  Weight:      Height:        Intake/Output Summary (Last 24 hours) at 10/07/2023 0745 Last data filed at 10/07/2023 0215 Gross per 24 hour  Intake 1100 ml  Output 500 ml  Net 600 ml   Filed Weights   10/06/23 1217 10/06/23 2200  Weight: 64.4 kg 65.1 kg    Examination:  General exam: Overall comfortable, not in distress HEENT: PERRL Respiratory system:  no wheezes or crackles  Cardiovascular system: S1 & S2 heard, RRR.  Gastrointestinal system: Abdomen is nondistended, soft and nontender. Central nervous system: Alert and oriented Extremities: No edema, no clubbing ,no cyanosis Skin: No rashes, no ulcers,no icterus  Data Reviewed: I have personally reviewed following labs and imaging studies  CBC: Recent Labs  Lab 10/06/23 1310  WBC 6.5  NEUTROABS 4.0  HGB 13.4  HCT 40.2  MCV 90.3  PLT 221   Basic Metabolic Panel: Recent Labs  Lab 10/06/23 1310  NA 136  K 3.9  CL 98  CO2 27  GLUCOSE 116*  BUN 14  CREATININE 0.75  CALCIUM 9.5     Recent Results (from the past 240 hours)  Resp panel by RT-PCR (RSV, Flu A&B, Covid) Anterior Nasal Swab     Status: None   Collection Time: 10/06/23  1:10 PM   Specimen: Anterior Nasal Swab  Result Value Ref Range Status   SARS Coronavirus 2 by RT PCR NEGATIVE NEGATIVE Final    Comment: (NOTE) SARS-CoV-2 target nucleic acids are NOT DETECTED.  The SARS-CoV-2 RNA is generally  detectable in upper respiratory specimens during the acute phase of infection. The lowest concentration of SARS-CoV-2 viral copies this assay can detect is 138 copies/mL. A negative result does not preclude SARS-Cov-2 infection and should not be used as the sole basis for treatment or other patient management decisions. A negative result may occur with  improper specimen collection/handling, submission of specimen other than nasopharyngeal swab, presence of viral mutation(s) within the areas targeted by this assay, and inadequate number of viral copies(<138 copies/mL). A negative result must be combined with clinical observations, patient history, and epidemiological information. The expected result is Negative.  Fact Sheet for Patients:  bloggercourse.com  Fact Sheet for Healthcare Providers:  seriousbroker.it  This test is no t yet approved or cleared by the [place]  FDA and  has been authorized for detection and/or diagnosis of SARS-CoV-2 by FDA under an Emergency Use Authorization (EUA). This EUA will remain  in effect (meaning this test can be used) for the duration of the COVID-19 declaration under Section 564(b)(1) of the Act, 21 U.S.C.section 360bbb-3(b)(1), unless the authorization is terminated  or revoked sooner.       Influenza A by PCR NEGATIVE NEGATIVE Final   Influenza B by PCR NEGATIVE NEGATIVE Final    Comment: (NOTE) The Xpert Xpress SARS-CoV-2/FLU/RSV plus assay is intended as an aid in the diagnosis of influenza from Nasopharyngeal swab specimens and should not be used as a sole basis for treatment. Nasal washings and aspirates are unacceptable for Xpert Xpress SARS-CoV-2/FLU/RSV testing.  Fact Sheet for Patients: bloggercourse.com  Fact Sheet for Healthcare Providers: seriousbroker.it  This test is not yet approved or cleared by the [place]  FDA  and has been authorized for detection and/or diagnosis of SARS-CoV-2 by FDA under an Emergency Use Authorization (EUA). This EUA will remain in effect (meaning this test can be used) for the duration of the COVID-19 declaration under Section 564(b)(1) of the Act, 21 U.S.C. section 360bbb-3(b)(1), unless the authorization is terminated or revoked.     Resp Syncytial Virus by PCR NEGATIVE NEGATIVE Final    Comment: (NOTE) Fact Sheet for Patients: bloggercourse.com  Fact Sheet for Healthcare Providers: seriousbroker.it  This test is not yet approved or cleared by the [place]  FDA and has been authorized for detection and/or diagnosis of SARS-CoV-2 by FDA under an Emergency Use Authorization (EUA). This EUA will remain in effect (meaning this test can be used) for the duration of the COVID-19 declaration under Section 564(b)(1) of the Act, 21 U.S.C. section 360bbb-3(b)(1), unless the authorization is terminated or revoked.  Performed at [facility], [address]  [place], [place] [address]      Radiology Studies: CT CHEST ABDOMEN PELVIS W CONTRAST Result Date: 10/06/2023 CLINICAL DATA:  Clemens yesterday, dizziness and nausea today, anticoagulated EXAM: CT CHEST, ABDOMEN, AND PELVIS WITH CONTRAST TECHNIQUE: Multidetector CT imaging of the chest, abdomen and pelvis was performed following the standard protocol during bolus administration of intravenous contrast. RADIATION DOSE REDUCTION: This exam was performed according to the departmental dose-optimization program which includes automated exposure control, adjustment of the mA and/or kV according to patient size and/or use of iterative reconstruction technique. CONTRAST:  OMNIPAQUE  IOHEXOL  300 MG/ML  SOLN COMPARISON:  09/11/2023 FINDINGS: CT CHEST FINDINGS Cardiovascular: Prominent biatrial dilatation, right greater than left. No pericardial effusion. Calcifications of  the mitral and aortic valves. No evidence of thoracic aortic aneurysm or dissection. Atherosclerosis of the aorta and coronary vasculature. Mediastinum/Nodes: No enlarged mediastinal, hilar, or axillary lymph nodes. Thyroid  gland, trachea, and esophagus demonstrate no significant findings. Small hiatal hernia. Lungs/Pleura: The geographic areas of ground-glass attenuation within the lungs noted on prior study are again seen on this exam, favor small airway disease or hypoventilatory change. No dense consolidation, effusion, or pneumothorax. Central airways are patent. Musculoskeletal: No acute or destructive bony abnormalities. Prior healed proximal right humeral fracture. Reconstructed images demonstrate no additional findings. CT ABDOMEN PELVIS FINDINGS Hepatobiliary: No hepatic injury or perihepatic hematoma. Gallbladder is unremarkable. Pancreas: Unremarkable. No pancreatic ductal dilatation or surrounding inflammatory changes. Spleen: No splenic injury or perisplenic hematoma. Adrenals/Urinary Tract: No adrenal hemorrhage or renal injury identified. Bladder is unremarkable. Stomach/Bowel: No bowel obstruction or ileus. Prior resection of the cecum with ileocolic anastomosis. There is diverticulosis throughout the distal colon with no evidence of acute diverticulitis. No bowel wall thickening or inflammatory change. Vascular/Lymphatic: Aortic atherosclerosis. No enlarged abdominal or pelvic lymph nodes. Reproductive: Uterus and bilateral adnexa are unremarkable. Other: No free fluid or free intraperitoneal gas. No abdominal wall hernia. Musculoskeletal: There is fat stranding within the presacral soft tissues. On the sagittal reconstructed images, there is subtle cortical discontinuity involving the ventral aspect of the distal sacrum at approximately the S4 level, consistent with nondisplaced fracture. No other acute bony abnormalities. Reconstructed images demonstrate no additional findings. IMPRESSION: 1.  Nondisplaced distal sacral fracture at approximately the S4 level, with edema in the presacral soft tissues. 2. No other signs of acute intrathoracic, intra-abdominal, or intrapelvic trauma. 3. Distal colonic diverticulosis without diverticulitis. 4. Stable biatrial dilatation, right greater than left. 5. Stable geographic ground-glass attenuation within the lungs, favor small airway disease or hypoventilatory changes. Mild chronic edema could give a similar appearance. 6. Aortic Atherosclerosis (ICD10-I70.0). Coronary artery atherosclerosis. Electronically Signed   By: Ozell Daring M.D.   On: 10/06/2023 17:34   CT Cervical Spine Wo Contrast Result Date: 10/06/2023 CLINICAL DATA:  Clemens yesterday, dizziness and nausea EXAM: CT CERVICAL SPINE WITHOUT CONTRAST TECHNIQUE: Multidetector CT imaging of the cervical spine was performed without intravenous contrast. Multiplanar CT image reconstructions were also generated. RADIATION DOSE REDUCTION: This exam was performed according to the departmental dose-optimization program which includes automated exposure control, adjustment of the mA and/or kV according to patient size and/or use of iterative reconstruction technique. COMPARISON:  None Available. FINDINGS: Alignment: Alignment is anatomic. Skull base and vertebrae: No acute fracture. No primary bone lesion or focal pathologic process. Soft tissues and spinal canal: No prevertebral fluid or swelling. No visible canal hematoma. Disc levels: Mild spondylosis at C5-6 and C6-7. Mild facet hypertrophy from C3-4 through C7-T1. Upper chest: Airway is patent. Lung  apices are clear. Atherosclerosis of the aortic arch. Other: Reconstructed images demonstrate no additional findings. IMPRESSION: 1. No acute cervical spine fracture. 2. Lower cervical degenerative changes. 3.  Aortic Atherosclerosis (ICD10-I70.0). Electronically Signed   By: Ozell Daring M.D.   On: 10/06/2023 17:26   CT Head Wo Contrast Result Date:  10/06/2023 CLINICAL DATA:  Clemens yesterday, dizziness and nausea today EXAM: CT HEAD WITHOUT CONTRAST TECHNIQUE: Contiguous axial images were obtained from the base of the skull through the vertex without intravenous contrast. RADIATION DOSE REDUCTION: This exam was performed according to the departmental dose-optimization program which includes automated exposure control, adjustment of the mA and/or kV according to patient size and/or use of iterative reconstruction technique. COMPARISON:  11/11/2018 FINDINGS: Brain: Progressive confluent hypodensities throughout the periventricular and subcortical white matter consistent with chronic small vessel ischemic changes. Chronic lacunar infarct again noted within the right basal ganglia. No signs of acute infarct or hemorrhage. Diffuse cerebral atrophy, with ex vacuo dilatation of the lateral ventricles. Remaining midline structures are unremarkable. No acute extra-axial fluid collections. No mass effect. Vascular: Atherosclerosis of the vertebral arteries and internal carotid arteries. No hyperdense vessel. Skull: Normal. Negative for fracture or focal lesion. Sinuses/Orbits: No acute finding. Other: None. IMPRESSION: 1. No acute intracranial process. 2. Extensive chronic small-vessel ischemic changes throughout the white matter and right basal ganglia as above. 3. Diffuse cerebral atrophy, with ex vacuo dilatation of the lateral ventricles. Electronically Signed   By: Ozell Daring M.D.   On: 10/06/2023 17:23   DG Chest Portable 1 View Result Date: 10/06/2023 CLINICAL DATA:  Dizzy with nausea.  Question pneumonia. EXAM: PORTABLE CHEST 1 VIEW COMPARISON:  09/10/2023 FINDINGS: The cardio pericardial silhouette is enlarged. The lungs are clear without focal pneumonia, edema, pneumothorax or pleural effusion. Interstitial markings are diffusely coarsened with chronic features. Surgical clips noted left axillary region. Telemetry leads overlie the chest. IMPRESSION:  Chronic interstitial coarsening without acute cardiopulmonary findings. Electronically Signed   By: Camellia Candle M.D.   On: 10/06/2023 13:28    Scheduled Meds:  apixaban   5 mg Oral BID   cholecalciferol   1,000 Units Oral Daily   donepezil   5 mg Oral QHS   furosemide   10 mg Oral BID   losartan   50 mg Oral Daily   metoprolol  succinate  25 mg Oral Daily   QUEtiapine   25 mg Oral BID   Continuous Infusions:   LOS: 0 days   Ivonne Mustache, MD [facility] P2/04/2024, 7:45 AM

## 2023-10-07 NOTE — Plan of Care (Signed)

## 2023-10-07 NOTE — Evaluation (Signed)
 Physical Therapy Evaluation Patient Details Name: Carol [name] MRN: [id number] DOB: 1928/03/17 Today's Date: 10/07/2023  History of Present Illness  89 y.o. female who presented to the ED after a fall at her ILF.  Pt sustained nondisplaced distal sacral fracture at approximately the S4 level, with edema in the presacral soft tissues.  Past medical history significant for atrial fibrillation on Eliquis , anemia, breast CA s/p remote double mastectomy, HTN, HLD, PSVT, CVA, dementia, bradycardia, mild cardiomyopathy (EF50-55% in 2024), and tricuspid regurgitation  Clinical Impression  Pt admitted with above diagnosis.  Pt currently with functional limitations due to the deficits listed below (see PT Problem List). Pt will benefit from acute skilled PT to increase their independence and safety with mobility to allow discharge.   Pt currently requiring max assist at times with mobility due to LOB.  Pt reports mild dizziness with sitting and then initially with ambulation however resolved.  Pt tends to forget rollator and requires cues due to cognition at baseline.  Due to increased fall risk and physical assist, pt would benefit from continued inpatient follow up therapy, <3 hours/day; unless ILF can provide 24/7 physical assist.          If plan is discharge home, recommend the following: A lot of help with walking and/or transfers;A lot of help with bathing/dressing/bathroom;Assistance with cooking/housework;Assist for transportation   Can travel by private vehicle   No    Equipment Recommendations Rolling walker (2 wheels)  Recommendations for Other Services       Functional Status Assessment Patient has had a recent decline in their functional status and demonstrates the ability to make significant improvements in function in a reasonable and predictable amount of time.     Precautions / Restrictions Precautions Precautions: Fall      Mobility  Bed Mobility Overal bed mobility:  Needs Assistance Bed Mobility: Supine to Sit     Supine to sit: Min assist, HOB elevated     General bed mobility comments: pt initiated without assist, light assist for trunk    Transfers Overall transfer level: Needs assistance Equipment used: Rolling walker (2 wheels) Transfers: Sit to/from Stand Sit to Stand: Mod assist           General transfer comment: multimodal cues for technique and safety    Ambulation/Gait Ambulation/Gait assistance: Max assist Gait Distance (Feet): 50 Feet Assistive device: Rolling walker (2 wheels) Gait Pattern/deviations: Step-through pattern, Decreased stride length, Trunk flexed Gait velocity: decr     General Gait Details: pt keeps RW too far forward (uses rollator at baseline), 3 episodes of LOB requiring max assist to prevent fall, HR up to 125 bpm per telemetry, pt reports initial dizziness but improved  Stairs            Wheelchair Mobility     Tilt Bed    Modified Rankin (Stroke Patients Only)       Balance Overall balance assessment: History of Falls, Needs assistance         Standing balance support: Bilateral upper extremity supported, During functional activity, Reliant on assistive device for balance Standing balance-Leahy Scale: Zero                               Pertinent Vitals/Pain Pain Assessment Pain Assessment: Faces Faces Pain Scale: Hurts little more Pain Location: tailbone with bed mobility/sitting Pain Descriptors / Indicators: Grimacing, Guarding Pain Intervention(s): Repositioned, Monitored during session  Home Living     Available Help at Discharge: Personal care attendant;Available PRN/intermittently Type of Home: Independent living facility         Home Layout: One level Home Equipment: Rollator (4 wheels)      Prior Function Prior Level of Function : Needs assist  Cognitive Assist : Mobility (cognitive)           Mobility Comments: ambulatory with  reminders to use rollator, walks to dining hall 3x daily ADLs Comments: able to perform but requires cues due to cognition     Extremity/Trunk Assessment        Lower Extremity Assessment Lower Extremity Assessment: Generalized weakness    Cervical / Trunk Assessment Cervical / Trunk Assessment: Kyphotic  Communication   Communication Communication: Hearing impairment  Cognition Arousal: Alert Behavior During Therapy: WFL for tasks assessed/performed Overall Cognitive Status: History of cognitive impairments - at baseline                                 General Comments: decreased memory        General Comments General comments (skin integrity, edema, etc.): Brother reports at least 4 falls in the past 6 months    Exercises     Assessment/Plan    PT Assessment Patient needs continued PT services  PT Problem List Decreased strength;Decreased activity tolerance;Decreased balance;Decreased mobility;Decreased knowledge of use of DME;Decreased safety awareness       PT Treatment Interventions DME instruction;Gait training;Balance training;Neuromuscular re-education;Functional mobility training;Therapeutic activities;Therapeutic exercise;Wheelchair mobility training;Patient/family education    PT Goals (Current goals can be found in the Care Plan section)  Acute Rehab PT Goals PT Goal Formulation: With patient/family Time For Goal Achievement: 10/21/23 Potential to Achieve Goals: Good    Frequency Min 1X/week     Co-evaluation               AM-PAC PT 6 Clicks Mobility  Outcome Measure Help needed turning from your back to your side while in a flat bed without using bedrails?: A Little Help needed moving from lying on your back to sitting on the side of a flat bed without using bedrails?: A Little Help needed moving to and from a bed to a chair (including a wheelchair)?: A Lot Help needed standing up from a chair using your arms (e.g.,  wheelchair or bedside chair)?: A Lot Help needed to walk in hospital room?: A Lot Help needed climbing 3-5 steps with a railing? : Total 6 Click Score: 13    End of Session Equipment Utilized During Treatment: Gait belt Activity Tolerance: Patient tolerated treatment well Patient left: in chair;with call bell/phone within reach;with chair alarm set;with family/visitor present (alarm pad and box in place, no cord to connect to hill-rom (notify desk) - NT and RN aware) Nurse Communication: Mobility status PT Visit Diagnosis: Difficulty in walking, not elsewhere classified (R26.2);History of falling (Z91.81);Unsteadiness on feet (R26.81)    Time: 1049-1110 PT Time Calculation (min) (ACUTE ONLY): 21 min   Charges:   PT Evaluation $PT Eval Low Complexity: 1 Low   PT General Charges $$ ACUTE PT VISIT: 1 Visit        Tari PT, DPT Physical Therapist Acute Rehabilitation Services Office: [phone]   Kati L Payson 10/07/2023, 12:05 PM

## 2023-10-08 DIAGNOSIS — W19XXXA Unspecified fall, initial encounter: Secondary | ICD-10-CM | POA: Diagnosis not present

## 2023-10-08 DIAGNOSIS — N3000 Acute cystitis without hematuria: Secondary | ICD-10-CM | POA: Diagnosis not present

## 2023-10-08 DIAGNOSIS — Y92009 Unspecified place in unspecified non-institutional (private) residence as the place of occurrence of the external cause: Secondary | ICD-10-CM | POA: Diagnosis not present

## 2023-10-08 LAB — URINE CULTURE: Culture: NO GROWTH

## 2023-10-08 MED ORDER — QUETIAPINE FUMARATE 25 MG PO TABS: 25.0000 mg | ORAL_TABLET | Freq: Every day | ORAL | Status: DC

## 2023-10-08 MED ORDER — QUETIAPINE FUMARATE 25 MG PO TABS
25.0000 mg | ORAL_TABLET | Freq: Every day | ORAL | Status: DC
  Administered 2023-10-08 – 2023-10-09 (×2): 25 mg via ORAL
  Filled 2023-10-08 (×2): qty 1

## 2023-10-08 MED ORDER — METOPROLOL SUCCINATE ER 25 MG PO TB24
12.5000 mg | ORAL_TABLET | Freq: Every day | ORAL | Status: DC
  Administered 2023-10-09: 12.5 mg via ORAL
  Filled 2023-10-08: qty 1

## 2023-10-08 NOTE — NC FL2 (Signed)
 [place]  MEDICAID FL2 LEVEL OF CARE FORM     IDENTIFICATION  Patient Name: Carol [name] Birthdate: 07/25/1928 Sex: female Admission Date (Current Location): 10/06/2023  [facility] and [place] Number:  Producer, television/film/video and Address:  [facility],  [address], [place] [address]      Provider Number: [id number]  Attending Physician Name and Address:  Leona Rake, MD  Relative Name and Phone Number:  Arnette Bias  [phone]    Current Level of Care: Hospital Recommended Level of Care: Skilled Nursing Facility Prior Approval Number:    Date Approved/Denied:   PASRR Number: [id number]  Discharge Plan: SNF    Current Diagnoses: Patient Active Problem List   Diagnosis Date Noted   Acute cystitis without hematuria 10/07/2023   Generalized weakness 10/07/2023   Nausea & vomiting 10/06/2023   Fall at home, initial encounter 10/06/2023   Pressure injury of skin 10/06/2023   Paroxysmal atrial fibrillation (HCC) 08/13/2018   Right knee pain 11/16/2017   Osteopenia 11/12/2016   Mild cognitive disorder 10/10/2016   Chronic anticoagulation 10/04/2016   History of atrial fibrillation 10/04/2016   Essential hypertension, benign 09/01/2016   Paroxysmal supraventricular tachycardia (HCC) 09/01/2016   Hyperlipidemia 09/01/2016    Orientation RESPIRATION BLADDER Height & Weight     Self, Place  Normal Continent Weight: 65.1 kg Height:  5\' 2"  (157.5 cm)  BEHAVIORAL SYMPTOMS/MOOD NEUROLOGICAL BOWEL NUTRITION STATUS  Other (Comment) (forgetful)  (n/a) Continent Diet (Regular diet)  AMBULATORY STATUS COMMUNICATION OF NEEDS Skin   Limited Assist Verbally Other (Comment) (erythema/redness noted to left buttocks)                       Personal Care Assistance Level of Assistance  Bathing, Feeding, Dressing Bathing Assistance: Limited assistance Feeding assistance: Independent Dressing Assistance: Limited assistance     Functional Limitations  Info  Sight, Hearing, Speech   Hearing Info: Impaired Speech Info: Adequate    SPECIAL CARE FACTORS FREQUENCY  PT (By licensed PT), OT (By licensed OT)     PT Frequency: 5x/wk OT Frequency: 5x/wk            Contractures Contractures Info: Not present    Additional Factors Info  Code Status, Allergies, Psychotropic, Insulin Sliding Scale, Isolation Precautions, Suctioning Needs Code Status Info: DNR Allergies Info: Tramadol, Sertraline, Zoloft (Sertraline Hcl) Psychotropic Info: see discharge summary Insulin Sliding Scale Info: n/a see discharge summary Isolation Precautions Info: n/a Suctioning Needs: n/a   Current Medications (10/08/2023):  This is the current hospital active medication list Current Facility-Administered Medications  Medication Dose Route Frequency Provider Last Rate Last Admin   acetaminophen  (TYLENOL ) tablet 650 mg  650 mg Oral Q6H PRN Amponsah, Prosper M, MD   650 mg at 10/07/23 2121   Or   acetaminophen  (TYLENOL ) suppository 650 mg  650 mg Rectal Q6H PRN Vita Grip, MD       apixaban  (ELIQUIS ) tablet 5 mg  5 mg Oral BID Amponsah, Prosper M, MD   5 mg at 10/08/23 1133   cholecalciferol  (VITAMIN D3) 25 MCG (1000 UNIT) tablet 1,000 Units  1,000 Units Oral Daily Vita Grip, MD   1,000 Units at 10/08/23 1132   cyanocobalamin  (VITAMIN B12) tablet 1,000 mcg  1,000 mcg Oral Daily Leona Rake, MD   1,000 mcg at 10/08/23 1133   donepezil  (ARICEPT ) tablet 5 mg  5 mg Oral QHS Amponsah, Prosper M, MD   5 mg at 10/07/23 2121   [  START ON 10/09/2023] metoprolol  succinate (TOPROL -XL) 24 hr tablet 12.5 mg  12.5 mg Oral Daily Adhikari, Amrit, MD       ondansetron  (ZOFRAN ) tablet 4 mg  4 mg Oral Q6H PRN Amponsah, Prosper M, MD       Or   ondansetron  (ZOFRAN ) injection 4 mg  4 mg Intravenous Q6H PRN Amponsah, Prosper M, MD       [START ON 10/09/2023] QUEtiapine  (SEROQUEL ) tablet 25 mg  25 mg Oral QHS Adhikari, Amrit, MD       senna-docusate (Senokot-S)  tablet 1 tablet  1 tablet Oral QHS PRN Amponsah, Prosper M, MD         Discharge Medications: Please see discharge summary for a list of discharge medications.  Relevant Imaging Results:  Relevant Lab Results:   Additional Information SS# [id number]  Loreda Rodriguez, RN

## 2023-10-08 NOTE — Evaluation (Signed)
 Occupational Therapy Evaluation Patient Details Name: Carol [name] MRN: [id number] DOB: Jan 22, 1928 Today's Date: 10/08/2023   History of Present Illness 89 y.o. female who presented to the ED after a fall at her ILF.  Pt sustained nondisplaced distal sacral fracture at approximately the S4 level, with edema in the presacral soft tissues.  Past medical history significant for atrial fibrillation on Eliquis , anemia, breast CA s/p remote double mastectomy, HTN, HLD, PSVT, CVA, dementia, bradycardia, mild cardiomyopathy (EF50-55% in 2024), and tricuspid regurgitation   Clinical Impression   Pt presents with decline in function and safety with ADLs and ADL mobility with impaired strength, balance, endurance and safety awareness/cognition. PTA pt lives at an ILF/ALF and was ind with ADLs/selfcare and used a rollater for mobility (pt does not remember using 4wRW but her brother provided this info). Pt currently requires mod A with LB ADLs, min A with toileting tasks and min A qwith mobility/transfers using RW. Pt requires multimodal cues for safety, especially with use of RW. Pt would benefit from acute OT services to address impairments to maximize level of function and saferty       If plan is discharge home, recommend the following: A lot of help with bathing/dressing/bathroom;A little help with walking and/or transfers;Assistance with cooking/housework;Assist for transportation;Help with stairs or ramp for entrance;Supervision due to cognitive status    Functional Status Assessment  Patient has had a recent decline in their functional status and demonstrates the ability to make significant improvements in function in a reasonable and predictable amount of time.  Equipment Recommendations  None recommended by OT    Recommendations for Other Services       Precautions / Restrictions Precautions Precautions: Fall Restrictions Weight Bearing Restrictions Per Provider Order: No       Mobility Bed Mobility Overal bed mobility: Needs Assistance Bed Mobility: Supine to Sit     Supine to sit: Contact guard, HOB elevated, Used rails     General bed mobility comments: no physical assist required    Transfers Overall transfer level: Needs assistance Equipment used: Rolling walker (2 wheels) Transfers: Sit to/from Stand Sit to Stand: Min assist           General transfer comment: multimodal cues for technique and safety      Balance Overall balance assessment: History of Falls, Needs assistance Sitting-balance support: No upper extremity supported, Feet supported Sitting balance-Leahy Scale: Good     Standing balance support: Bilateral upper extremity supported, During functional activity, Reliant on assistive device for balance Standing balance-Leahy Scale: Poor                             ADL either performed or assessed with clinical judgement   ADL Overall ADL's : Needs assistance/impaired Eating/Feeding: Independent   Grooming: Wash/dry hands;Wash/dry face;Contact guard assist;Standing   Upper Body Bathing: Set up;Supervision/ safety;Sitting   Lower Body Bathing: Moderate assistance   Upper Body Dressing : Set up;Supervision/safety;Sitting   Lower Body Dressing: Moderate assistance   Toilet Transfer: Minimal assistance;Ambulation;Rolling walker (2 wheels);Cueing for safety;Regular Toilet;Grab bars   Toileting- Clothing Manipulation and Hygiene: Minimal assistance;Sit to/from stand       Functional mobility during ADLs: Minimal assistance;Rolling walker (2 wheels);Cueing for safety General ADL Comments: pt forgetting to use RW and required cues for reminders     Vision Baseline Vision/History: 1 Wears glasses Ability to See in Adequate Light: 0 Adequate Patient Visual Report: No change from baseline  Perception         Praxis         Pertinent Vitals/Pain Pain Assessment Pain Assessment: No/denies  pain Pain Score: 0-No pain     Extremity/Trunk Assessment Upper Extremity Assessment Upper Extremity Assessment: Generalized weakness   Lower Extremity Assessment Lower Extremity Assessment: Defer to PT evaluation       Communication Communication Communication: Hearing impairment   Cognition Arousal: Alert Behavior During Therapy: WFL for tasks assessed/performed Overall Cognitive Status: History of cognitive impairments - at baseline Area of Impairment: Memory, Safety/judgement                               General Comments: decreased memory     General Comments       Exercises     Shoulder Instructions      Home Living Family/patient expects to be discharged to:: Assisted living (ILF/ALF)   Available Help at Discharge: Personal care attendant;Available PRN/intermittently Type of Home: Independent living facility       Home Layout: One level               Home Equipment: Rollator (4 wheels);Shower seat          Prior Functioning/Environment Prior Level of Function : Needs assist  Cognitive Assist : Mobility (cognitive)           Mobility Comments: ambulatory with reminders to use rollator, walks to dining hall 3x daily ADLs Comments: Ind - Mod I        OT Problem List: Impaired balance (sitting and/or standing);Decreased strength;Decreased cognition;Decreased safety awareness;Decreased activity tolerance;Decreased knowledge of use of DME or AE      OT Treatment/Interventions: Self-care/ADL training;DME and/or AE instruction;Therapeutic activities;Balance training;Therapeutic exercise;Patient/family education    OT Goals(Current goals can be found in the care plan section) Acute Rehab OT Goals Patient Stated Goal: go home OT Goal Formulation: With patient/family Time For Goal Achievement: 10/22/23 Potential to Achieve Goals: Good ADL Goals Pt Will Perform Grooming: with supervision;with set-up;standing Pt Will Perform Upper  Body Bathing: with set-up Pt Will Perform Lower Body Bathing: with min assist;with contact guard assist;sit to/from stand Pt Will Perform Upper Body Dressing: with set-up Pt Will Perform Lower Body Dressing: with min assist;with contact guard assist;sit to/from stand Pt Will Transfer to Toilet: with contact guard assist;with supervision;ambulating Pt Will Perform Toileting - Clothing Manipulation and hygiene: with contact guard assist;with supervision;sit to/from stand Pt Will Perform Tub/Shower Transfer: with min assist;with contact guard assist;ambulating;rolling walker;shower seat;grab bars  OT Frequency: Min 1X/week    Co-evaluation              AM-PAC OT "6 Clicks" Daily Activity     Outcome Measure Help from another person eating meals?: None Help from another person taking care of personal grooming?: A Little Help from another person toileting, which includes using toliet, bedpan, or urinal?: A Little Help from another person bathing (including washing, rinsing, drying)?: A Lot Help from another person to put on and taking off regular upper body clothing?: A Little Help from another person to put on and taking off regular lower body clothing?: A Lot 6 Click Score: 17   End of Session Equipment Utilized During Treatment: Gait belt;Rolling walker (2 wheels) Nurse Communication: Mobility status  Activity Tolerance: Patient tolerated treatment well Patient left: in chair;with call bell/phone within reach;with chair alarm set;with family/visitor present (MD in to see pt)  OT Visit Diagnosis:  Unsteadiness on feet (R26.81);Other abnormalities of gait and mobility (R26.89);Muscle weakness (generalized) (M62.81);History of falling (Z91.81)                Time: 1610-9604 OT Time Calculation (min): 28 min Charges:  OT General Charges $OT Visit: 1 Visit OT Evaluation $OT Eval Moderate Complexity: 1 Mod OT Treatments $Self Care/Home Management : 8-22 mins $Therapeutic Activity:  8-22 mins    Alfred Ann 10/08/2023, 12:53 PM

## 2023-10-08 NOTE — Plan of Care (Signed)

## 2023-10-08 NOTE — TOC Initial Note (Addendum)
 Transition of Care [facility]) - Initial/Assessment Note    Patient Details  Name: Carol [name] MRN: [id number] Date of Birth: October 17, 1927  [facility]) CM/SW Contact:    Loreda Rodriguez, RN Phone Number:[phone]  10/08/2023, 1:35 PM  Clinical Narrative:                 [facility] consulted for patient with SNF referral. CM at bedside to speak with patient and brother. Patient gives verbal consent to allow brother to help with SNF placement. CM has provided brother Lenise Quince with medicare.gov list for facilities. Both patient and brother are agreeable to CM initiating bed search. TOC will follow.   1520 PASRR # [id number] A Insurance [employer]ried [employer] initiated.   Expected Discharge Plan: Skilled Nursing Facility Barriers to Discharge: No Barriers Identified   Patient Goals and CMS Choice Patient states their goals for this hospitalization and ongoing recovery are:: Wants to get stronger CMS Medicare.gov Compare Post Acute Care list provided to:: Patient Represenative (must comment) (Brother per pt request) Choice offered to / list presented to : Sibling, Patient [place] ownership interest in [facility]provided to:: Sibling    Expected Discharge Plan and Services In-house Referral: NA Discharge Planning Services: CM Consult Post Acute Care Choice: Skilled Nursing Facility Living arrangements for the past 2 months: Apartment                 DME Arranged: N/A DME Agency: NA       HH Arranged: NA HH Agency: NA        Prior Living Arrangements/Services Living arrangements for the past 2 months: Apartment Lives with:: Siblings Patient language and need for interpreter reviewed:: Yes Do you feel safe going back to the place where you live?: Yes      Need for Family Participation in Patient Care: Yes (Comment) Care giver support system in place?: Yes (comment) Current home services:  (n/a) Criminal Activity/Legal Involvement Pertinent to Current  Situation/Hospitalization: No - Comment as needed  Activities of Daily Living   ADL Screening (condition at time of admission) Independently performs ADLs?: No Does the patient have a NEW difficulty with bathing/dressing/toileting/self-feeding that is expected to last >3 days?: Yes (Initiates electronic notice to provider for possible OT consult) Does the patient have a NEW difficulty with getting in/out of bed, walking, or climbing stairs that is expected to last >3 days?: Yes (Initiates electronic notice to provider for possible PT consult) Does the patient have a NEW difficulty with communication that is expected to last >3 days?: Yes (Initiates electronic notice to provider for possible SLP consult) Is the patient deaf or have difficulty hearing?: Yes Does the patient have difficulty seeing, even when wearing glasses/contacts?: No Does the patient have difficulty concentrating, remembering, or making decisions?: Yes  Permission Sought/Granted Permission sought to share information with : Family Supports Permission granted to share information with : Yes, Verbal Permission Granted  Share Information with NAME: Arnette Bias (Brother) [phone]     Permission granted to share info w Relationship: brother  Permission granted to share info w Contact Information: [phone]  Emotional Assessment Appearance:: Appears stated age Attitude/Demeanor/Rapport: Engaged Affect (typically observed): Pleasant, Quiet Orientation: : Oriented to Self, Oriented to Place Alcohol / Substance Use: Not Applicable Psych Involvement: No (comment)  Admission diagnosis:  Dizziness [R42] Nausea & vomiting [R11.2] Fall in elderly patient [R29.6] Nausea and vomiting, unspecified vomiting type [R11.2] Patient Active Problem List   Diagnosis Date Noted   Acute cystitis without hematuria  10/07/2023   Generalized weakness 10/07/2023   Nausea & vomiting 10/06/2023   Fall at home, initial encounter  10/06/2023   Pressure injury of skin 10/06/2023   Paroxysmal atrial fibrillation (HCC) 08/13/2018   Right knee pain 11/16/2017   Osteopenia 11/12/2016   Mild cognitive disorder 10/10/2016   Chronic anticoagulation 10/04/2016   History of atrial fibrillation 10/04/2016   Essential hypertension, benign 09/01/2016   Paroxysmal supraventricular tachycardia (HCC) 09/01/2016   Hyperlipidemia 09/01/2016   PCP:  Kaylee Partridge, MD Pharmacy:   [facility] - [place], [place] - [address] [place] [address] Phone: [phone] Fax: [phone]  [id number] - [place], [place] - [address] AT [facility] [address] [place] [address] Phone: [phone] Fax: [phone]     Social Drivers of Health (SDOH) Social History: SDOH Screenings   Food Insecurity: No Food Insecurity (10/06/2023)  Housing: Low Risk  (10/06/2023)  Transportation Needs: No Transportation Needs (10/06/2023)  Utilities: Not At Risk (10/06/2023)  Depression (PHQ2-9): Low Risk  (10/04/2022)  Financial Resource Strain: Low Risk  (09/23/2023)  Physical Activity: Unknown (09/23/2023)  Recent Concern: Physical Activity - Inactive (09/23/2023)  Social Connections: Socially Isolated (10/06/2023)  Stress: No Stress Concern Present (09/23/2023)  Tobacco Use: Low Risk  (10/06/2023)   SDOH Interventions:     Readmission Risk Interventions     No data to display

## 2023-10-08 NOTE — Care Management Obs Status (Signed)
 MEDICARE OBSERVATION STATUS NOTIFICATION   Patient Details  Name: Carol [name] MRN: [id number] Date of Birth: 1928-01-15   Medicare Observation Status Notification Given:  Yes    Loreda Rodriguez, RN 10/08/2023, 8:49 AM

## 2023-10-08 NOTE — Progress Notes (Signed)
 PROGRESS NOTE  Carol [name]  ZOX:[id number] DOB: 03-26-1928 DOA: 10/06/2023 PCP: Kaylee Partridge, MD   Brief Narrative: Patient is a 89 female with history of paroxysmal A-fib, breast cancer status post double mastectomy, hypertension, hyperlipidemia, CVA, dementia tricuspid valve regurgitation who presented to the Emergency Department after she fell at independent living facility followed by pain on her tailbone.  On presentation, she was hemodynamically stable.  Chest x-ray, CT head, CT cervical spine did not show any acute findings.  CT chest/abdomen/pelvis showed nondisplaced distal sacral fracture at S4 level with edema in the presacral soft tissues.  Admitted for pain management, PT evaluation.  PT recommending SNF on discharge.  Medically stable for discharge whenever possible.  TOC following  Assessment & Plan:  Principal Problem:   Fall at home, initial encounter Active Problems:   Nausea & vomiting   Pressure injury of skin   Acute cystitis without hematuria   Generalized weakness  Falls/generalized weakness/nondisplaced sacral fracture: Presented with unwitnessed fall at independent living facility.  Forgot to use her walker.  Imaging showed mild nondisplaced distal sacral fracture at S4 level with edema in the presacral soft tissues.  Currently pain well-controlled.  PT/OT evaluation done, recommended SNF  Suspected UTI: Denies dysuria.  No fever or leukocytosis.  Urine culture did not show any growth.  Antibiotics discontinued  Paroxysmal A-fib: Currently in normal sinus rhythm.  On Eliquis  and Toprol .  Monitor on telemetry  History of cardiomyopathy/tricuspid valve regurgitation: Last echo done on May last year showed EF of 50 to 55%, moderate pulmonary artery hypertension, severe TVR, moderate AVR.  Currently on Lasix  low-dose and Toprol .  We recommend follow-up with cardiology as an outpatient  Dementia: Confused to time.  Oriented to person and place.  Currently at  baseline.  Continue delirium precaution, frequent reorientation.  Continue Aricept , Seroquel   Low vitamin B12: Given a IM dose, followed by daily  oral supplementation  Hypertension: On losartan  and Toprol  at home, Toprol  being continued  for now.       Pressure Injury 10/06/23 Buttocks Left Stage 1 -  Intact skin with non-blanchable redness of a localized area usually over a bony prominence. (Active)  10/06/23 2100  Location: Buttocks  Location Orientation: Left  Staging: Stage 1 -  Intact skin with non-blanchable redness of a localized area usually over a bony prominence.  Wound Description (Comments):   Present on Admission: Yes  Dressing Type Foam - Lift dressing to assess site every shift 10/08/23 0801     Pressure Injury 10/06/23 Buttocks Right Stage 1 -  Intact skin with non-blanchable redness of a localized area usually over a bony prominence. (Active)  10/06/23 2100  Location: Buttocks  Location Orientation: Right  Staging: Stage 1 -  Intact skin with non-blanchable redness of a localized area usually over a bony prominence.  Wound Description (Comments):   Present on Admission: Yes  Dressing Type Foam - Lift dressing to assess site every shift 10/08/23 0801    DVT prophylaxis: apixaban  (ELIQUIS ) tablet 5 mg     Code Status: Limited: Do not attempt resuscitation (DNR) -DNR-LIMITED -Do Not Intubate/DNI   Family Communication: Discussed with brother at bedside on 2/10  Patient status: Inpatient  Patient is from : ILF  Anticipated discharge to:SnF  Estimated DC date:whenever possible   Consultants: None  Procedures:None  Antimicrobials:  Anti-infectives (From admission, onward)    Start     Dose/Rate Route Frequency Ordered Stop   10/06/23 1745  cefTRIAXone  (ROCEPHIN ) 1  g in sodium chloride  0.9 % 100 mL IVPB        1 g 200 mL/hr over 30 Minutes Intravenous  Once 10/06/23 1737 10/06/23 1941       Subjective: Patient seen and examined at bedside today.   Hemodynamically stable lying very comfortably on the chair.  Denies any new complaints.  Pain well-controlled.  Brother agreeable for SNF placement  Objective: Vitals:   10/07/23 2118 10/08/23 0534 10/08/23 0801 10/08/23 0921  BP: 132/70 (!) 111/96    Pulse:  (!) 51    Resp: 17 16 18 17   Temp: (!) 97.4 F (36.3 C) 98.3 F (36.8 C)    TempSrc: Oral Oral    SpO2: 94% 94%    Weight:      Height:        Intake/Output Summary (Last 24 hours) at 10/08/2023 1145 Last data filed at 10/08/2023 1100 Gross per 24 hour  Intake 600 ml  Output --  Net 600 ml   Filed Weights   10/06/23 1217 10/06/23 2200  Weight: 64.4 kg 65.1 kg    Examination:  General exam: Overall comfortable, not in distress HEENT: PERRL Respiratory system:  no wheezes or crackles  Cardiovascular system: S1 & S2 heard, RRR.  Gastrointestinal system: Abdomen is nondistended, soft and nontender. Central nervous system: Alert and oriented to place, obeys commands Extremities: No edema, no clubbing ,no cyanosis Skin: No rashes, no ulcers,no icterus    Data Reviewed: I have personally reviewed following labs and imaging studies  CBC: Recent Labs  Lab 10/06/23 1310 10/07/23 0723  WBC 6.5 6.8  NEUTROABS 4.0  --   HGB 13.4 11.7*  HCT 40.2 37.2  MCV 90.3 93.5  PLT 221 196   Basic Metabolic Panel: Recent Labs  Lab 10/06/23 1310 10/07/23 0723  NA 136 140  K 3.9 3.5  CL 98 103  CO2 27 25  GLUCOSE 116* 79  BUN 14 16  CREATININE 0.75 0.76  CALCIUM 9.5 8.8*  MG  --  1.9  PHOS  --  3.5     Recent Results (from the past 240 hours)  Resp panel by RT-PCR (RSV, Flu A&B, Covid) Anterior Nasal Swab     Status: None   Collection Time: 10/06/23  1:10 PM   Specimen: Anterior Nasal Swab  Result Value Ref Range Status   SARS Coronavirus 2 by RT PCR NEGATIVE NEGATIVE Final    Comment: (NOTE) SARS-CoV-2 target nucleic acids are NOT DETECTED.  The SARS-CoV-2 RNA is generally detectable in upper  respiratory specimens during the acute phase of infection. The lowest concentration of SARS-CoV-2 viral copies this assay can detect is 138 copies/mL. A negative result does not preclude SARS-Cov-2 infection and should not be used as the sole basis for treatment or other patient management decisions. A negative result may occur with  improper specimen collection/handling, submission of specimen other than nasopharyngeal swab, presence of viral mutation(s) within the areas targeted by this assay, and inadequate number of viral copies(<138 copies/mL). A negative result must be combined with clinical observations, patient history, and epidemiological information. The expected result is Negative.  Fact Sheet for Patients:  BloggerCourse.com  Fact Sheet for Healthcare Providers:  SeriousBroker.it  This test is no t yet approved or cleared by the [place]  FDA and  has been authorized for detection and/or diagnosis of SARS-CoV-2 by FDA under an Emergency Use Authorization (EUA). This EUA will remain  in effect (meaning this test can be used) for the  duration of the COVID-19 declaration under Section 564(b)(1) of the Act, 21 U.S.C.section 360bbb-3(b)(1), unless the authorization is terminated  or revoked sooner.       Influenza A by PCR NEGATIVE NEGATIVE Final   Influenza B by PCR NEGATIVE NEGATIVE Final    Comment: (NOTE) The Xpert Xpress SARS-CoV-2/FLU/RSV plus assay is intended as an aid in the diagnosis of influenza from Nasopharyngeal swab specimens and should not be used as a sole basis for treatment. Nasal washings and aspirates are unacceptable for Xpert Xpress SARS-CoV-2/FLU/RSV testing.  Fact Sheet for Patients: BloggerCourse.com  Fact Sheet for Healthcare Providers: SeriousBroker.it  This test is not yet approved or cleared by the [place]  FDA and has been  authorized for detection and/or diagnosis of SARS-CoV-2 by FDA under an Emergency Use Authorization (EUA). This EUA will remain in effect (meaning this test can be used) for the duration of the COVID-19 declaration under Section 564(b)(1) of the Act, 21 U.S.C. section 360bbb-3(b)(1), unless the authorization is terminated or revoked.     Resp Syncytial Virus by PCR NEGATIVE NEGATIVE Final    Comment: (NOTE) Fact Sheet for Patients: BloggerCourse.com  Fact Sheet for Healthcare Providers: SeriousBroker.it  This test is not yet approved or cleared by the [place]  FDA and has been authorized for detection and/or diagnosis of SARS-CoV-2 by FDA under an Emergency Use Authorization (EUA). This EUA will remain in effect (meaning this test can be used) for the duration of the COVID-19 declaration under Section 564(b)(1) of the Act, 21 U.S.C. section 360bbb-3(b)(1), unless the authorization is terminated or revoked.  Performed at [facility], [address] [place], [place] [address]   Urine Culture (for pregnant, neutropenic or urologic patients or patients with an indwelling urinary catheter)     Status: None   Collection Time: 10/07/23  2:28 AM   Specimen: Urine, Clean Catch  Result Value Ref Range Status   Specimen Description   Final    URINE, CLEAN CATCH Performed at [facility], [address] [place], [place] [address]    Special Requests   Final    URINE, CLEAN CATCH Performed at [facility], [address], [place], [place] [address]    Culture   Final    NO GROWTH Performed at [facility], [address], [place], [place] [address]    Report Status 10/08/2023 FINAL  Final     Radiology Studies: CT CHEST ABDOMEN PELVIS W CONTRAST Result Date: 10/06/2023 CLINICAL DATA:  Marvell Slider yesterday, dizziness and nausea today, anticoagulated EXAM: CT CHEST, ABDOMEN, AND  PELVIS WITH CONTRAST TECHNIQUE: Multidetector CT imaging of the chest, abdomen and pelvis was performed following the standard protocol during bolus administration of intravenous contrast. RADIATION DOSE REDUCTION: This exam was performed according to the departmental dose-optimization program which includes automated exposure control, adjustment of the mA and/or kV according to patient size and/or use of iterative reconstruction technique. CONTRAST:  OMNIPAQUE  IOHEXOL  300 MG/ML  SOLN COMPARISON:  09/11/2023 FINDINGS: CT CHEST FINDINGS Cardiovascular: Prominent biatrial dilatation, right greater than left. No pericardial effusion. Calcifications of the mitral and aortic valves. No evidence of thoracic aortic aneurysm or dissection. Atherosclerosis of the aorta and coronary vasculature. Mediastinum/Nodes: No enlarged mediastinal, hilar, or axillary lymph nodes. Thyroid  gland, trachea, and esophagus demonstrate no significant findings. Small hiatal hernia. Lungs/Pleura: The geographic areas of ground-glass attenuation within the lungs noted on prior study are again seen on this exam, favor small airway disease  or hypoventilatory change. No dense consolidation, effusion, or pneumothorax. Central airways are patent. Musculoskeletal: No acute or destructive bony abnormalities. Prior healed proximal right humeral fracture. Reconstructed images demonstrate no additional findings. CT ABDOMEN PELVIS FINDINGS Hepatobiliary: No hepatic injury or perihepatic hematoma. Gallbladder is unremarkable. Pancreas: Unremarkable. No pancreatic ductal dilatation or surrounding inflammatory changes. Spleen: No splenic injury or perisplenic hematoma. Adrenals/Urinary Tract: No adrenal hemorrhage or renal injury identified. Bladder is unremarkable. Stomach/Bowel: No bowel obstruction or ileus. Prior resection of the cecum with ileocolic anastomosis. There is diverticulosis throughout the distal colon with no evidence of acute  diverticulitis. No bowel wall thickening or inflammatory change. Vascular/Lymphatic: Aortic atherosclerosis. No enlarged abdominal or pelvic lymph nodes. Reproductive: Uterus and bilateral adnexa are unremarkable. Other: No free fluid or free intraperitoneal gas. No abdominal wall hernia. Musculoskeletal: There is fat stranding within the presacral soft tissues. On the sagittal reconstructed images, there is subtle cortical discontinuity involving the ventral aspect of the distal sacrum at approximately the S4 level, consistent with nondisplaced fracture. No other acute bony abnormalities. Reconstructed images demonstrate no additional findings. IMPRESSION: 1. Nondisplaced distal sacral fracture at approximately the S4 level, with edema in the presacral soft tissues. 2. No other signs of acute intrathoracic, intra-abdominal, or intrapelvic trauma. 3. Distal colonic diverticulosis without diverticulitis. 4. Stable biatrial dilatation, right greater than left. 5. Stable geographic ground-glass attenuation within the lungs, favor small airway disease or hypoventilatory changes. Mild chronic edema could give a similar appearance. 6. Aortic Atherosclerosis (ICD10-I70.0). Coronary artery atherosclerosis. Electronically Signed   By: Bobbye Burrow M.D.   On: 10/06/2023 17:34   CT Cervical Spine Wo Contrast Result Date: 10/06/2023 CLINICAL DATA:  Marvell Slider yesterday, dizziness and nausea EXAM: CT CERVICAL SPINE WITHOUT CONTRAST TECHNIQUE: Multidetector CT imaging of the cervical spine was performed without intravenous contrast. Multiplanar CT image reconstructions were also generated. RADIATION DOSE REDUCTION: This exam was performed according to the departmental dose-optimization program which includes automated exposure control, adjustment of the mA and/or kV according to patient size and/or use of iterative reconstruction technique. COMPARISON:  None Available. FINDINGS: Alignment: Alignment is anatomic. Skull base and  vertebrae: No acute fracture. No primary bone lesion or focal pathologic process. Soft tissues and spinal canal: No prevertebral fluid or swelling. No visible canal hematoma. Disc levels: Mild spondylosis at C5-6 and C6-7. Mild facet hypertrophy from C3-4 through C7-T1. Upper chest: Airway is patent. Lung apices are clear. Atherosclerosis of the aortic arch. Other: Reconstructed images demonstrate no additional findings. IMPRESSION: 1. No acute cervical spine fracture. 2. Lower cervical degenerative changes. 3.  Aortic Atherosclerosis (ICD10-I70.0). Electronically Signed   By: Bobbye Burrow M.D.   On: 10/06/2023 17:26   CT Head Wo Contrast Result Date: 10/06/2023 CLINICAL DATA:  Marvell Slider yesterday, dizziness and nausea today EXAM: CT HEAD WITHOUT CONTRAST TECHNIQUE: Contiguous axial images were obtained from the base of the skull through the vertex without intravenous contrast. RADIATION DOSE REDUCTION: This exam was performed according to the departmental dose-optimization program which includes automated exposure control, adjustment of the mA and/or kV according to patient size and/or use of iterative reconstruction technique. COMPARISON:  11/11/2018 FINDINGS: Brain: Progressive confluent hypodensities throughout the periventricular and subcortical white matter consistent with chronic small vessel ischemic changes. Chronic lacunar infarct again noted within the right basal ganglia. No signs of acute infarct or hemorrhage. Diffuse cerebral atrophy, with ex vacuo dilatation of the lateral ventricles. Remaining midline structures are unremarkable. No acute extra-axial fluid collections. No mass effect. Vascular: Atherosclerosis of the  vertebral arteries and internal carotid arteries. No hyperdense vessel. Skull: Normal. Negative for fracture or focal lesion. Sinuses/Orbits: No acute finding. Other: None. IMPRESSION: 1. No acute intracranial process. 2. Extensive chronic small-vessel ischemic changes throughout the  white matter and right basal ganglia as above. 3. Diffuse cerebral atrophy, with ex vacuo dilatation of the lateral ventricles. Electronically Signed   By: Bobbye Burrow M.D.   On: 10/06/2023 17:23   DG Chest Portable 1 View Result Date: 10/06/2023 CLINICAL DATA:  Dizzy with nausea.  Question pneumonia. EXAM: PORTABLE CHEST 1 VIEW COMPARISON:  09/10/2023 FINDINGS: The cardio pericardial silhouette is enlarged. The lungs are clear without focal pneumonia, edema, pneumothorax or pleural effusion. Interstitial markings are diffusely coarsened with chronic features. Surgical clips noted left axillary region. Telemetry leads overlie the chest. IMPRESSION: Chronic interstitial coarsening without acute cardiopulmonary findings. Electronically Signed   By: Donnal Fusi M.D.   On: 10/06/2023 13:28    Scheduled Meds:  apixaban   5 mg Oral BID   cholecalciferol   1,000 Units Oral Daily   vitamin B-12  1,000 mcg Oral Daily   donepezil   5 mg Oral QHS   metoprolol  succinate  25 mg Oral Daily   [START ON 10/09/2023] QUEtiapine   25 mg Oral QHS   Continuous Infusions:   LOS: 0 days   Leona Rake, MD [facility] P2/05/2024, 11:45 AM  PROGRESS NOTE  Carol [name]  ZOX:[id number] DOB: 1928/05/13 DOA: 10/06/2023 PCP: Kaylee Partridge, MD   Brief Narrative:   Assessment & Plan:  Principal Problem:   Fall at home, initial encounter Active Problems:   Nausea & vomiting   Pressure injury of skin   Acute cystitis without hematuria   Generalized weakness        Pressure Injury 10/06/23 Buttocks Left Stage 1 -  Intact skin with non-blanchable redness of a localized area usually over a bony prominence. (Active)  10/06/23 2100  Location: Buttocks  Location Orientation: Left  Staging: Stage 1 -  Intact skin with non-blanchable redness of a localized area usually over a bony prominence.  Wound Description (Comments):   Present on Admission: Yes  Dressing Type Foam - Lift dressing to  assess site every shift 10/08/23 0801     Pressure Injury 10/06/23 Buttocks Right Stage 1 -  Intact skin with non-blanchable redness of a localized area usually over a bony prominence. (Active)  10/06/23 2100  Location: Buttocks  Location Orientation: Right  Staging: Stage 1 -  Intact skin with non-blanchable redness of a localized area usually over a bony prominence.  Wound Description (Comments):   Present on Admission: Yes  Dressing Type Foam - Lift dressing to assess site every shift 10/08/23 0801    DVT prophylaxis: apixaban  (ELIQUIS ) tablet 5 mg     Code Status: Limited: Do not attempt resuscitation (DNR) -DNR-LIMITED -Do Not Intubate/DNI   Family Communication:   Patient status:  Patient is from :  Anticipated discharge to:  Estimated DC date:   Consultants:   Procedures:  Antimicrobials:  Anti-infectives (From admission, onward)    Start     Dose/Rate Route Frequency Ordered Stop   10/06/23 1745  cefTRIAXone  (ROCEPHIN ) 1 g in sodium chloride  0.9 % 100 mL IVPB        1 g 200 mL/hr over 30 Minutes Intravenous  Once 10/06/23 1737 10/06/23 1941       Subjective:   Objective: Vitals:   10/07/23 2118 10/08/23 0534 10/08/23 0801 10/08/23 0921  BP: 132/70 (!) 111/96  Pulse:  (!) 51    Resp: 17 16 18 17   Temp: (!) 97.4 F (36.3 C) 98.3 F (36.8 C)    TempSrc: Oral Oral    SpO2: 94% 94%    Weight:      Height:        Intake/Output Summary (Last 24 hours) at 10/08/2023 1145 Last data filed at 10/08/2023 1100 Gross per 24 hour  Intake 600 ml  Output --  Net 600 ml   Filed Weights   10/06/23 1217 10/06/23 2200  Weight: 64.4 kg 65.1 kg    Examination:  General exam: Overall comfortable, not in distress HEENT: PERRL Respiratory system:  no wheezes or crackles  Cardiovascular system: S1 & S2 heard, RRR.  Gastrointestinal system: Abdomen is nondistended, soft and nontender. Central nervous system: Alert and oriented Extremities: No edema, no  clubbing ,no cyanosis Skin: No rashes, no ulcers,no icterus     Data Reviewed: I have personally reviewed following labs and imaging studies  CBC: Recent Labs  Lab 10/06/23 1310 10/07/23 0723  WBC 6.5 6.8  NEUTROABS 4.0  --   HGB 13.4 11.7*  HCT 40.2 37.2  MCV 90.3 93.5  PLT 221 196   Basic Metabolic Panel: Recent Labs  Lab 10/06/23 1310 10/07/23 0723  NA 136 140  K 3.9 3.5  CL 98 103  CO2 27 25  GLUCOSE 116* 79  BUN 14 16  CREATININE 0.75 0.76  CALCIUM 9.5 8.8*  MG  --  1.9  PHOS  --  3.5     Recent Results (from the past 240 hours)  Resp panel by RT-PCR (RSV, Flu A&B, Covid) Anterior Nasal Swab     Status: None   Collection Time: 10/06/23  1:10 PM   Specimen: Anterior Nasal Swab  Result Value Ref Range Status   SARS Coronavirus 2 by RT PCR NEGATIVE NEGATIVE Final    Comment: (NOTE) SARS-CoV-2 target nucleic acids are NOT DETECTED.  The SARS-CoV-2 RNA is generally detectable in upper respiratory specimens during the acute phase of infection. The lowest concentration of SARS-CoV-2 viral copies this assay can detect is 138 copies/mL. A negative result does not preclude SARS-Cov-2 infection and should not be used as the sole basis for treatment or other patient management decisions. A negative result may occur with  improper specimen collection/handling, submission of specimen other than nasopharyngeal swab, presence of viral mutation(s) within the areas targeted by this assay, and inadequate number of viral copies(<138 copies/mL). A negative result must be combined with clinical observations, patient history, and epidemiological information. The expected result is Negative.  Fact Sheet for Patients:  BloggerCourse.com  Fact Sheet for Healthcare Providers:  SeriousBroker.it  This test is no t yet approved or cleared by the [place]  FDA and  has been authorized for detection and/or diagnosis of  SARS-CoV-2 by FDA under an Emergency Use Authorization (EUA). This EUA will remain  in effect (meaning this test can be used) for the duration of the COVID-19 declaration under Section 564(b)(1) of the Act, 21 U.S.C.section 360bbb-3(b)(1), unless the authorization is terminated  or revoked sooner.       Influenza A by PCR NEGATIVE NEGATIVE Final   Influenza B by PCR NEGATIVE NEGATIVE Final    Comment: (NOTE) The Xpert Xpress SARS-CoV-2/FLU/RSV plus assay is intended as an aid in the diagnosis of influenza from Nasopharyngeal swab specimens and should not be used as a sole basis for treatment. Nasal washings and aspirates are unacceptable for Xpert Xpress SARS-CoV-2/FLU/RSV  testing.  Fact Sheet for Patients: BloggerCourse.com  Fact Sheet for Healthcare Providers: SeriousBroker.it  This test is not yet approved or cleared by the [place]  FDA and has been authorized for detection and/or diagnosis of SARS-CoV-2 by FDA under an Emergency Use Authorization (EUA). This EUA will remain in effect (meaning this test can be used) for the duration of the COVID-19 declaration under Section 564(b)(1) of the Act, 21 U.S.C. section 360bbb-3(b)(1), unless the authorization is terminated or revoked.     Resp Syncytial Virus by PCR NEGATIVE NEGATIVE Final    Comment: (NOTE) Fact Sheet for Patients: BloggerCourse.com  Fact Sheet for Healthcare Providers: SeriousBroker.it  This test is not yet approved or cleared by the [place]  FDA and has been authorized for detection and/or diagnosis of SARS-CoV-2 by FDA under an Emergency Use Authorization (EUA). This EUA will remain in effect (meaning this test can be used) for the duration of the COVID-19 declaration under Section 564(b)(1) of the Act, 21 U.S.C. section 360bbb-3(b)(1), unless the authorization is terminated  or revoked.  Performed at [facility], [address] [place], [place] [address]   Urine Culture (for pregnant, neutropenic or urologic patients or patients with an indwelling urinary catheter)     Status: None   Collection Time: 10/07/23  2:28 AM   Specimen: Urine, Clean Catch  Result Value Ref Range Status   Specimen Description   Final    URINE, CLEAN CATCH Performed at [facility], [address] [place], [place] [address]    Special Requests   Final    URINE, CLEAN CATCH Performed at [facility], [address], [place], [place] [address]    Culture   Final    NO GROWTH Performed at [facility], [address], [place], [place] [address]    Report Status 10/08/2023 FINAL  Final     Radiology Studies: CT CHEST ABDOMEN PELVIS W CONTRAST Result Date: 10/06/2023 CLINICAL DATA:  Marvell Slider yesterday, dizziness and nausea today, anticoagulated EXAM: CT CHEST, ABDOMEN, AND PELVIS WITH CONTRAST TECHNIQUE: Multidetector CT imaging of the chest, abdomen and pelvis was performed following the standard protocol during bolus administration of intravenous contrast. RADIATION DOSE REDUCTION: This exam was performed according to the departmental dose-optimization program which includes automated exposure control, adjustment of the mA and/or kV according to patient size and/or use of iterative reconstruction technique. CONTRAST:  OMNIPAQUE  IOHEXOL  300 MG/ML  SOLN COMPARISON:  09/11/2023 FINDINGS: CT CHEST FINDINGS Cardiovascular: Prominent biatrial dilatation, right greater than left. No pericardial effusion. Calcifications of the mitral and aortic valves. No evidence of thoracic aortic aneurysm or dissection. Atherosclerosis of the aorta and coronary vasculature. Mediastinum/Nodes: No enlarged mediastinal, hilar, or axillary lymph nodes. Thyroid  gland, trachea, and esophagus demonstrate no significant findings. Small hiatal hernia.  Lungs/Pleura: The geographic areas of ground-glass attenuation within the lungs noted on prior study are again seen on this exam, favor small airway disease or hypoventilatory change. No dense consolidation, effusion, or pneumothorax. Central airways are patent. Musculoskeletal: No acute or destructive bony abnormalities. Prior healed proximal right humeral fracture. Reconstructed images demonstrate no additional findings. CT ABDOMEN PELVIS FINDINGS Hepatobiliary: No hepatic injury or perihepatic hematoma. Gallbladder is unremarkable. Pancreas: Unremarkable. No pancreatic ductal dilatation or surrounding inflammatory changes. Spleen: No splenic injury or perisplenic hematoma. Adrenals/Urinary Tract: No adrenal hemorrhage or renal injury identified. Bladder is unremarkable. Stomach/Bowel: No bowel obstruction or ileus. Prior resection of the cecum with ileocolic anastomosis. There is  diverticulosis throughout the distal colon with no evidence of acute diverticulitis. No bowel wall thickening or inflammatory change. Vascular/Lymphatic: Aortic atherosclerosis. No enlarged abdominal or pelvic lymph nodes. Reproductive: Uterus and bilateral adnexa are unremarkable. Other: No free fluid or free intraperitoneal gas. No abdominal wall hernia. Musculoskeletal: There is fat stranding within the presacral soft tissues. On the sagittal reconstructed images, there is subtle cortical discontinuity involving the ventral aspect of the distal sacrum at approximately the S4 level, consistent with nondisplaced fracture. No other acute bony abnormalities. Reconstructed images demonstrate no additional findings. IMPRESSION: 1. Nondisplaced distal sacral fracture at approximately the S4 level, with edema in the presacral soft tissues. 2. No other signs of acute intrathoracic, intra-abdominal, or intrapelvic trauma. 3. Distal colonic diverticulosis without diverticulitis. 4. Stable biatrial dilatation, right greater than left. 5. Stable  geographic ground-glass attenuation within the lungs, favor small airway disease or hypoventilatory changes. Mild chronic edema could give a similar appearance. 6. Aortic Atherosclerosis (ICD10-I70.0). Coronary artery atherosclerosis. Electronically Signed   By: Bobbye Burrow M.D.   On: 10/06/2023 17:34   CT Cervical Spine Wo Contrast Result Date: 10/06/2023 CLINICAL DATA:  Marvell Slider yesterday, dizziness and nausea EXAM: CT CERVICAL SPINE WITHOUT CONTRAST TECHNIQUE: Multidetector CT imaging of the cervical spine was performed without intravenous contrast. Multiplanar CT image reconstructions were also generated. RADIATION DOSE REDUCTION: This exam was performed according to the departmental dose-optimization program which includes automated exposure control, adjustment of the mA and/or kV according to patient size and/or use of iterative reconstruction technique. COMPARISON:  None Available. FINDINGS: Alignment: Alignment is anatomic. Skull base and vertebrae: No acute fracture. No primary bone lesion or focal pathologic process. Soft tissues and spinal canal: No prevertebral fluid or swelling. No visible canal hematoma. Disc levels: Mild spondylosis at C5-6 and C6-7. Mild facet hypertrophy from C3-4 through C7-T1. Upper chest: Airway is patent. Lung apices are clear. Atherosclerosis of the aortic arch. Other: Reconstructed images demonstrate no additional findings. IMPRESSION: 1. No acute cervical spine fracture. 2. Lower cervical degenerative changes. 3.  Aortic Atherosclerosis (ICD10-I70.0). Electronically Signed   By: Bobbye Burrow M.D.   On: 10/06/2023 17:26   CT Head Wo Contrast Result Date: 10/06/2023 CLINICAL DATA:  Marvell Slider yesterday, dizziness and nausea today EXAM: CT HEAD WITHOUT CONTRAST TECHNIQUE: Contiguous axial images were obtained from the base of the skull through the vertex without intravenous contrast. RADIATION DOSE REDUCTION: This exam was performed according to the departmental dose-optimization  program which includes automated exposure control, adjustment of the mA and/or kV according to patient size and/or use of iterative reconstruction technique. COMPARISON:  11/11/2018 FINDINGS: Brain: Progressive confluent hypodensities throughout the periventricular and subcortical white matter consistent with chronic small vessel ischemic changes. Chronic lacunar infarct again noted within the right basal ganglia. No signs of acute infarct or hemorrhage. Diffuse cerebral atrophy, with ex vacuo dilatation of the lateral ventricles. Remaining midline structures are unremarkable. No acute extra-axial fluid collections. No mass effect. Vascular: Atherosclerosis of the vertebral arteries and internal carotid arteries. No hyperdense vessel. Skull: Normal. Negative for fracture or focal lesion. Sinuses/Orbits: No acute finding. Other: None. IMPRESSION: 1. No acute intracranial process. 2. Extensive chronic small-vessel ischemic changes throughout the white matter and right basal ganglia as above. 3. Diffuse cerebral atrophy, with ex vacuo dilatation of the lateral ventricles. Electronically Signed   By: Bobbye Burrow M.D.   On: 10/06/2023 17:23   DG Chest Portable 1 View Result Date: 10/06/2023 CLINICAL DATA:  Dizzy with nausea.  Question pneumonia. EXAM:  PORTABLE CHEST 1 VIEW COMPARISON:  09/10/2023 FINDINGS: The cardio pericardial silhouette is enlarged. The lungs are clear without focal pneumonia, edema, pneumothorax or pleural effusion. Interstitial markings are diffusely coarsened with chronic features. Surgical clips noted left axillary region. Telemetry leads overlie the chest. IMPRESSION: Chronic interstitial coarsening without acute cardiopulmonary findings. Electronically Signed   By: Donnal Fusi M.D.   On: 10/06/2023 13:28    Scheduled Meds:  apixaban   5 mg Oral BID   cholecalciferol   1,000 Units Oral Daily   vitamin B-12  1,000 mcg Oral Daily   donepezil   5 mg Oral QHS   metoprolol  succinate  25 mg  Oral Daily   [START ON 10/09/2023] QUEtiapine   25 mg Oral QHS   Continuous Infusions:   LOS: 0 days   Leona Rake, MD [facility] P2/05/2024, 11:45 AM

## 2023-10-09 ENCOUNTER — Encounter (INDEPENDENT_AMBULATORY_CARE_PROVIDER_SITE_OTHER): Payer: Medicare HMO | Admitting: Cardiology

## 2023-10-09 DIAGNOSIS — I480 Paroxysmal atrial fibrillation: Secondary | ICD-10-CM | POA: Diagnosis not present

## 2023-10-09 DIAGNOSIS — Y92009 Unspecified place in unspecified non-institutional (private) residence as the place of occurrence of the external cause: Secondary | ICD-10-CM | POA: Diagnosis not present

## 2023-10-09 DIAGNOSIS — W19XXXA Unspecified fall, initial encounter: Secondary | ICD-10-CM | POA: Diagnosis not present

## 2023-10-09 DIAGNOSIS — N3000 Acute cystitis without hematuria: Secondary | ICD-10-CM | POA: Diagnosis not present

## 2023-10-09 MED ORDER — SENNOSIDES-DOCUSATE SODIUM 8.6-50 MG PO TABS
1.0000 | ORAL_TABLET | Freq: Two times a day (BID) | ORAL | Status: DC
  Administered 2023-10-09 – 2023-10-10 (×2): 1 via ORAL
  Filled 2023-10-09 (×2): qty 1

## 2023-10-09 MED ORDER — POLYETHYLENE GLYCOL 3350 17 G PO PACK
17.0000 g | PACK | Freq: Every day | ORAL | Status: DC
  Administered 2023-10-09 – 2023-10-10 (×2): 17 g via ORAL
  Filled 2023-10-09 (×2): qty 1

## 2023-10-09 MED ORDER — METOPROLOL SUCCINATE ER 25 MG PO TB24
25.0000 mg | ORAL_TABLET | Freq: Every day | ORAL | Status: DC
  Administered 2023-10-10: 25 mg via ORAL
  Filled 2023-10-09: qty 1

## 2023-10-09 MED ORDER — CYANOCOBALAMIN 1000 MCG PO TABS: 1000.0000 ug | ORAL_TABLET | Freq: Every day | ORAL | Status: DC

## 2023-10-09 MED ORDER — METOPROLOL TARTRATE 25 MG PO TABS
25.0000 mg | ORAL_TABLET | Freq: Four times a day (QID) | ORAL | Status: DC | PRN
  Administered 2023-10-09: 25 mg via ORAL
  Filled 2023-10-09: qty 1

## 2023-10-09 MED ORDER — METOPROLOL SUCCINATE ER 25 MG PO TB24: 12.5000 mg | ORAL_TABLET | Freq: Every day | ORAL | Status: DC

## 2023-10-09 MED ORDER — POLYETHYLENE GLYCOL 3350 17 G PO PACK: 17.0000 g | PACK | Freq: Every day | ORAL | Status: DC

## 2023-10-09 MED ORDER — SENNOSIDES-DOCUSATE SODIUM 8.6-50 MG PO TABS: 1.0000 | ORAL_TABLET | Freq: Two times a day (BID) | ORAL | Status: DC

## 2023-10-09 NOTE — TOC Progression Note (Addendum)
Transition of Care [facility]) - Progression Note    Patient Details  Name: Carol [name] MRN: [id number] Date of Birth: 1928-08-11  [facility] CM/SW Contact  Beckie Busing, RN Phone Number:[phone]  10/09/2023, 11:09 AM  Clinical Narrative:    CM following up on bed offers. Family's choices are [facility] or [facility]. CM called [facility] there is no answer. Message has been left for [facility]. Will await return call. CM spoke with [facility] admissions coordinator for [facility]. Per Kenard Gower facility can not accept any patients that are not from their community. Kenard Gower states that she will update the request in the hub due to facility is full.   1st choice [facility] -declined 2nd choice [facility]- declined 3rd choice [facility]- declined  [phone] CM at bedside to update patient and brother on bed offers. Brother states that he is very discouraged about bed offers and explains that he may end up taking her back to her independent living at [facility]. Brother states that she has someone that can assist with her meds, meals and laundry and that he is also working on someone to come in and stay with her for some hours daily. CM has provided brother with list of facilities that can accept patient. Brother states that he and his wife will need to review offers and will follow up with CM in the morning.    Expected Discharge Plan: Skilled Nursing Facility Barriers to Discharge: No Barriers Identified  Expected Discharge Plan and Services In-house Referral: NA Discharge Planning Services: CM Consult Post Acute Care Choice: Skilled Nursing Facility Living arrangements for the past 2 months: Apartment Expected Discharge Date: 10/09/23               DME Arranged: N/A DME Agency: NA       HH Arranged: NA HH Agency: NA         Social Determinants of Health (SDOH) Interventions SDOH Screenings   Food Insecurity: No Food Insecurity (10/06/2023)   Housing: Low Risk  (10/06/2023)  Transportation Needs: No Transportation Needs (10/06/2023)  Utilities: Not At Risk (10/06/2023)  Depression (PHQ2-9): Low Risk  (10/04/2022)  Financial Resource Strain: Low Risk  (09/23/2023)  Physical Activity: Unknown (09/23/2023)  Recent Concern: Physical Activity - Inactive (09/23/2023)  Social Connections: Socially Isolated (10/06/2023)  Stress: No Stress Concern Present (09/23/2023)  Tobacco Use: Low Risk  (10/06/2023)    Readmission Risk Interventions     No data to display

## 2023-10-09 NOTE — Discharge Summary (Signed)
Physician Discharge Summary  Carol [name] ZOX:[id number] DOB: [birth year] DOA: 10/06/2023  PCP: Pearline Cables, MD  Admit date: 10/06/2023 Discharge date: 10/10/2023  Admitted From: Home Disposition:  Home  Discharge Condition:Stable CODE STATUS:DNR Diet recommendation: Heart Healthy  Brief/Interim Summary:  Patient is a 51 female with history of paroxysmal A-fib, breast cancer status post double mastectomy, hypertension, hyperlipidemia, CVA, dementia tricuspid valve regurgitation who presented to the Emergency Department after she fell at independent living facility followed by pain on her tailbone.  On presentation, she was hemodynamically stable.  Chest x-ray, CT head, CT cervical spine did not show any acute findings.  CT chest/abdomen/pelvis showed nondisplaced distal sacral fracture at S4 level with edema in the presacral soft tissues.  Admitted for pain management, PT evaluation.  Pain is significantly better now.  Remains hemodynamically stable.  PT recommending SNF on discharge.  Family wants to take her home.  Home health ordered  Following problems were addressed during the hospitalization:  Falls/generalized weakness/nondisplaced sacral fracture: Presented with unwitnessed fall at independent living facility.  Forgot to use her walker.  Imaging showed mild nondisplaced distal sacral fracture at S4 level with edema in the presacral soft tissues.  Currently pain well-controlled.  PT/OT evaluation done, recommended SNF, unable to take her home.   Suspected UTI: Denies dysuria.  No fever or leukocytosis.  Urine culture did not show any growth.  Antibiotics discontinued   Paroxysmal A-fib: Currently in normal sinus rhythm.  On Eliquis and Toprol.  Monitor on telemetry.  Decreased the dose of Toprol due to mild hypotension   History of cardiomyopathy/tricuspid valve regurgitation: Last echo done on May last year showed EF of 50 to 55%, moderate pulmonary artery hypertension, severe  TVR, moderate AVR.  on Lasix low-dose and Toprol at home.  Can resume Lasix on discharge. we recommend follow-up with cardiology as an outpatient   Dementia: Confused to time.  Oriented to person and place.  Currently at baseline.  Continue delirium precaution, frequent reorientation.  Continue Aricept, Seroquel   Low vitamin B12: Given a IM dose, followed by daily  oral supplementation   Hypertension: On losartan and Toprol at home, Toprol being continued  for now at low-dose.  Losartan held due to soft blood pressure.  Discharge Diagnoses:  Principal Problem:   Fall at home, initial encounter Active Problems:   Nausea & vomiting   Pressure injury of skin   Acute cystitis without hematuria   Generalized weakness    Discharge Instructions  Discharge Instructions     Diet - low sodium heart healthy   Complete by: As directed    Discharge instructions   Complete by: As directed    1)Please take your medications as instructed 2)Follow up with your PCP in a week   Increase activity slowly   Complete by: As directed    No wound care   Complete by: As directed       Allergies as of 10/10/2023       Reactions   Tramadol Other (See Comments)   seizure   Sertraline Other (See Comments)   Zoloft [sertraline Hcl]    headache        Medication List     STOP taking these medications    losartan 50 MG tablet Commonly known as: COZAAR       TAKE these medications    acetaminophen 500 MG tablet Commonly known as: TYLENOL Take 500 mg by mouth every 8 (eight) hours as needed for moderate  pain (pain score 4-6) or mild pain (pain score 1-3).   cyanocobalamin 1000 MCG tablet Take 1 tablet (1,000 mcg total) by mouth daily.   donepezil 5 MG disintegrating tablet Commonly known as: ARICEPT ODT DISSOLVE 1 TABLET ON THE TONGUE AT BEDTIME What changed: See the new instructions.   Eliquis 5 MG Tabs tablet Generic drug: apixaban TAKE 1 TABLET TWICE DAILY   furosemide 20  MG tablet Commonly known as: LASIX TAKE 1/2 TABLET EVERY DAY What changed: how much to take   metoprolol succinate 25 MG 24 hr tablet Commonly known as: TOPROL-XL Take 0.5 tablets (12.5 mg total) by mouth daily. What changed: how much to take   polyethylene glycol 17 g packet Commonly known as: MIRALAX / GLYCOLAX Take 17 g by mouth daily for 14 days.   QUEtiapine 25 MG tablet Commonly known as: SEROQUEL TAKE 1 TABLET BY MOUTH AT BEDTIME. INCREASE TO TWICE DAILY AS DIRECTED BY MD What changed: See the new instructions.   senna-docusate 8.6-50 MG tablet Commonly known as: Senokot-S Take 1 tablet by mouth 2 (two) times daily for 7 days.   Vitamin D3 1000 units Caps Take 1,000 Units by mouth daily.        Follow-up Information     Copland, Gwenlyn Found, MD. Schedule an appointment as soon as possible for a visit in 2 week(s).   Specialty: Family Medicine Contact information: [address] [place] [address] [phone]                Allergies  Allergen Reactions   Tramadol Other (See Comments)    seizure   Sertraline Other (See Comments)   Zoloft [Sertraline Hcl]     headache    Consultations: None   Procedures/Studies: CT CHEST ABDOMEN PELVIS W CONTRAST Result Date: 10/06/2023 CLINICAL DATA:  Larey Seat yesterday, dizziness and nausea today, anticoagulated EXAM: CT CHEST, ABDOMEN, AND PELVIS WITH CONTRAST TECHNIQUE: Multidetector CT imaging of the chest, abdomen and pelvis was performed following the standard protocol during bolus administration of intravenous contrast. RADIATION DOSE REDUCTION: This exam was performed according to the departmental dose-optimization program which includes automated exposure control, adjustment of the mA and/or kV according to patient size and/or use of iterative reconstruction technique. CONTRAST:  OMNIPAQUE IOHEXOL 300 MG/ML  SOLN COMPARISON:  09/11/2023 FINDINGS: CT CHEST FINDINGS Cardiovascular: Prominent  biatrial dilatation, right greater than left. No pericardial effusion. Calcifications of the mitral and aortic valves. No evidence of thoracic aortic aneurysm or dissection. Atherosclerosis of the aorta and coronary vasculature. Mediastinum/Nodes: No enlarged mediastinal, hilar, or axillary lymph nodes. Thyroid gland, trachea, and esophagus demonstrate no significant findings. Small hiatal hernia. Lungs/Pleura: The geographic areas of ground-glass attenuation within the lungs noted on prior study are again seen on this exam, favor small airway disease or hypoventilatory change. No dense consolidation, effusion, or pneumothorax. Central airways are patent. Musculoskeletal: No acute or destructive bony abnormalities. Prior healed proximal right humeral fracture. Reconstructed images demonstrate no additional findings. CT ABDOMEN PELVIS FINDINGS Hepatobiliary: No hepatic injury or perihepatic hematoma. Gallbladder is unremarkable. Pancreas: Unremarkable. No pancreatic ductal dilatation or surrounding inflammatory changes. Spleen: No splenic injury or perisplenic hematoma. Adrenals/Urinary Tract: No adrenal hemorrhage or renal injury identified. Bladder is unremarkable. Stomach/Bowel: No bowel obstruction or ileus. Prior resection of the cecum with ileocolic anastomosis. There is diverticulosis throughout the distal colon with no evidence of acute diverticulitis. No bowel wall thickening or inflammatory change. Vascular/Lymphatic: Aortic atherosclerosis. No enlarged abdominal or pelvic  lymph nodes. Reproductive: Uterus and bilateral adnexa are unremarkable. Other: No free fluid or free intraperitoneal gas. No abdominal wall hernia. Musculoskeletal: There is fat stranding within the presacral soft tissues. On the sagittal reconstructed images, there is subtle cortical discontinuity involving the ventral aspect of the distal sacrum at approximately the S4 level, consistent with nondisplaced fracture. No other acute bony  abnormalities. Reconstructed images demonstrate no additional findings. IMPRESSION: 1. Nondisplaced distal sacral fracture at approximately the S4 level, with edema in the presacral soft tissues. 2. No other signs of acute intrathoracic, intra-abdominal, or intrapelvic trauma. 3. Distal colonic diverticulosis without diverticulitis. 4. Stable biatrial dilatation, right greater than left. 5. Stable geographic ground-glass attenuation within the lungs, favor small airway disease or hypoventilatory changes. Mild chronic edema could give a similar appearance. 6. Aortic Atherosclerosis (ICD10-I70.0). Coronary artery atherosclerosis. Electronically Signed   By: Sharlet Salina M.D.   On: 10/06/2023 17:34   CT Cervical Spine Wo Contrast Result Date: 10/06/2023 CLINICAL DATA:  Larey Seat yesterday, dizziness and nausea EXAM: CT CERVICAL SPINE WITHOUT CONTRAST TECHNIQUE: Multidetector CT imaging of the cervical spine was performed without intravenous contrast. Multiplanar CT image reconstructions were also generated. RADIATION DOSE REDUCTION: This exam was performed according to the departmental dose-optimization program which includes automated exposure control, adjustment of the mA and/or kV according to patient size and/or use of iterative reconstruction technique. COMPARISON:  None Available. FINDINGS: Alignment: Alignment is anatomic. Skull base and vertebrae: No acute fracture. No primary bone lesion or focal pathologic process. Soft tissues and spinal canal: No prevertebral fluid or swelling. No visible canal hematoma. Disc levels: Mild spondylosis at C5-6 and C6-7. Mild facet hypertrophy from C3-4 through C7-T1. Upper chest: Airway is patent. Lung apices are clear. Atherosclerosis of the aortic arch. Other: Reconstructed images demonstrate no additional findings. IMPRESSION: 1. No acute cervical spine fracture. 2. Lower cervical degenerative changes. 3.  Aortic Atherosclerosis (ICD10-I70.0). Electronically Signed   By:  Sharlet Salina M.D.   On: 10/06/2023 17:26   CT Head Wo Contrast Result Date: 10/06/2023 CLINICAL DATA:  Larey Seat yesterday, dizziness and nausea today EXAM: CT HEAD WITHOUT CONTRAST TECHNIQUE: Contiguous axial images were obtained from the base of the skull through the vertex without intravenous contrast. RADIATION DOSE REDUCTION: This exam was performed according to the departmental dose-optimization program which includes automated exposure control, adjustment of the mA and/or kV according to patient size and/or use of iterative reconstruction technique. COMPARISON:  11/11/2018 FINDINGS: Brain: Progressive confluent hypodensities throughout the periventricular and subcortical white matter consistent with chronic small vessel ischemic changes. Chronic lacunar infarct again noted within the right basal ganglia. No signs of acute infarct or hemorrhage. Diffuse cerebral atrophy, with ex vacuo dilatation of the lateral ventricles. Remaining midline structures are unremarkable. No acute extra-axial fluid collections. No mass effect. Vascular: Atherosclerosis of the vertebral arteries and internal carotid arteries. No hyperdense vessel. Skull: Normal. Negative for fracture or focal lesion. Sinuses/Orbits: No acute finding. Other: None. IMPRESSION: 1. No acute intracranial process. 2. Extensive chronic small-vessel ischemic changes throughout the white matter and right basal ganglia as above. 3. Diffuse cerebral atrophy, with ex vacuo dilatation of the lateral ventricles. Electronically Signed   By: Sharlet Salina M.D.   On: 10/06/2023 17:23   DG Chest Portable 1 View Result Date: 10/06/2023 CLINICAL DATA:  Dizzy with nausea.  Question pneumonia. EXAM: PORTABLE CHEST 1 VIEW COMPARISON:  09/10/2023 FINDINGS: The cardio pericardial silhouette is enlarged. The lungs are clear without focal pneumonia, edema, pneumothorax or pleural effusion.  Interstitial markings are diffusely coarsened with chronic features. Surgical clips  noted left axillary region. Telemetry leads overlie the chest. IMPRESSION: Chronic interstitial coarsening without acute cardiopulmonary findings. Electronically Signed   By: Kennith Center M.D.   On: 10/06/2023 13:28   CT ANGIO CHEST AORTA W/CM & OR WO/CM Result Date: 09/11/2023 CLINICAL DATA:  left arm swelling x 1 week, rule out thoracic outlet syndrome EXAM: CT ANGIOGRAPHY CHEST WITH CONTRAST TECHNIQUE: Multidetector CT imaging of the chest was performed using the standard protocol during bolus administration of intravenous contrast. Multiplanar CT image reconstructions and MIPs were obtained to evaluate the vascular anatomy. RADIATION DOSE REDUCTION: This exam was performed according to the departmental dose-optimization program which includes automated exposure control, adjustment of the mA and/or kV according to patient size and/or use of iterative reconstruction technique. CONTRAST:  OMNIPAQUE IOHEXOL 350 MG/ML SOLN COMPARISON:  Chest radiograph yesterday FINDINGS: Cardiovascular: There are no filling defects within the pulmonary arteries to suggest pulmonary embolus. Multi chamber cardiomegaly with primarily right heart distension. Prominent contrast flexing into the hepatic veins and IVC. Aortic atherosclerosis and tortuosity. No acute aortic findings. The aortic branch vessels are patent. The included left upper extremity arterial vasculature is patent. The SVC is patent. No pericardial effusion. Mediastinum/Nodes: Small hiatal hernia with wall thickening of the distal esophagus. No enlarged mediastinal or hilar lymph nodes. Surgical clips in the left axilla. No axillary adenopathy. Lungs/Pleura: Small bilateral pleural effusions. Heterogeneous pulmonary parenchyma with geographic areas of ground-glass. Subpleural scarring in the anterior left upper lobe and lingula. Trachea and central airways are clear. Upper Abdomen: Contrast refluxes into the hepatic veins and IVC. Atherosclerosis of the upper  abdominal aorta. Musculoskeletal: Exaggerated thoracic kyphosis. Chronic right proximal humeral deformity. No acute osseous findings. Review of the MIP images confirms the above findings. IMPRESSION: 1. No pulmonary embolus. 2. Multi chamber cardiomegaly with primarily right heart distension. Prominent contrast refluxing into the hepatic veins and IVC, suggesting right heart dysfunction. 3. Small bilateral pleural effusions. Heterogeneous pulmonary parenchyma with geographic areas of ground-glass, likely pulmonary edema. 4. Small hiatal hernia with wall thickening of the distal esophagus, can be seen with reflux/esophagitis. Aortic Atherosclerosis (ICD10-I70.0). Electronically Signed   By: Narda Rutherford M.D.   On: 09/11/2023 16:34   DG Forearm Left Result Date: 09/10/2023 CLINICAL DATA:  Fall with left arm swelling. EXAM: LEFT FOREARM - 2 VIEW COMPARISON:  None Available. FINDINGS: No acute fracture. Distal radius hardware is intact without periprosthetic lucency. Wrist and elbow alignment are maintained. No erosive or bony destructive change. Prominent generalized subcutaneous edema. No soft tissue gas or radiopaque foreign body. IMPRESSION: 1. No fracture of the forearm. Intact distal radial surgical hardware. 2. Generalized soft tissue edema. Electronically Signed   By: Narda Rutherford M.D.   On: 09/10/2023 15:47   DG Humerus Left Result Date: 09/10/2023 CLINICAL DATA:  Fall with left arm swelling. EXAM: LEFT HUMERUS - 2+ VIEW COMPARISON:  None Available. FINDINGS: There is no evidence of fracture or other focal bone lesions. Cortical margins of the humerus are intact. Shoulder and elbow alignment are maintained. Soft tissue edema but the distal medial arm, partially obscured on the AP view due to overlapping soft tissue. No radiopaque foreign body. IMPRESSION: No humeral fracture.  Soft tissue edema. Electronically Signed   By: Narda Rutherford M.D.   On: 09/10/2023 15:45   DG Chest 2 View Result  Date: 09/10/2023 CLINICAL DATA:  Status post fall.  Left arm swelling. EXAM: CHEST - 2  VIEW COMPARISON:  Chest radiograph 02/05/2023 FINDINGS: Unchanged cardiomegaly. Stable mediastinal contours. No pneumothorax or focal airspace disease. No pleural effusion. Multiple bilateral axillary surgical clips. Chronic right proximal humeral deformity. On limited assessment, no grossly displaced rib fracture. IMPRESSION: Chronic cardiomegaly.  No acute findings. Electronically Signed   By: Narda Rutherford M.D.   On: 09/10/2023 15:44      Subjective: Patient seen and examined at bedside today.  Very comfortable.  Lying on bed.  Brother at bedside.  Denies any new complaints.  Medically stable for discharge to home with home  Discharge Exam: Vitals:   10/10/23 0428 10/10/23 0653  BP: (!) 116/55 125/72  Pulse: (!) 48 (!) 53  Resp: 18 18  Temp: 97.8 F (36.6 C) (!) 97.5 F (36.4 C)  SpO2: 95% 95%   Vitals:   10/09/23 2144 10/09/23 2351 10/10/23 0428 10/10/23 0653  BP:  (!) 108/54 (!) 116/55 125/72  Pulse:  60 (!) 48 (!) 53  Resp:  18 18 18   Temp:  (!) 97.5 F (36.4 C) 97.8 F (36.6 C) (!) 97.5 F (36.4 C)  TempSrc:  Oral Oral Oral  SpO2: 95% 93% 95% 95%  Weight:      Height:        General: Pt is alert, awake, not in acute distress, oriented to place only Cardiovascular: RRR, S1/S2 +, no rubs, no gallops Respiratory: CTA bilaterally, no wheezing, no rhonchi Abdominal: Soft, NT, ND, bowel sounds + Extremities: no edema, no cyanosis    The results of significant diagnostics from this hospitalization (including imaging, microbiology, ancillary and laboratory) are listed below for reference.     Microbiology: Recent Results (from the past 240 hours)  Resp panel by RT-PCR (RSV, Flu A&B, Covid) Anterior Nasal Swab     Status: None   Collection Time: 10/06/23  1:10 PM   Specimen: Anterior Nasal Swab  Result Value Ref Range Status   SARS Coronavirus 2 by RT PCR NEGATIVE NEGATIVE Final     Comment: (NOTE) SARS-CoV-2 target nucleic acids are NOT DETECTED.  The SARS-CoV-2 RNA is generally detectable in upper respiratory specimens during the acute phase of infection. The lowest concentration of SARS-CoV-2 viral copies this assay can detect is 138 copies/mL. A negative result does not preclude SARS-Cov-2 infection and should not be used as the sole basis for treatment or other patient management decisions. A negative result may occur with  improper specimen collection/handling, submission of specimen other than nasopharyngeal swab, presence of viral mutation(s) within the areas targeted by this assay, and inadequate number of viral copies(<138 copies/mL). A negative result must be combined with clinical observations, patient history, and epidemiological information. The expected result is Negative.  Fact Sheet for Patients:  BloggerCourse.com  Fact Sheet for Healthcare Providers:  SeriousBroker.it  This test is no t yet approved or cleared by the [place] FDA and  has been authorized for detection and/or diagnosis of SARS-CoV-2 by FDA under an Emergency Use Authorization (EUA). This EUA will remain  in effect (meaning this test can be used) for the duration of the COVID-19 declaration under Section 564(b)(1) of the Act, 21 U.S.C.section 360bbb-3(b)(1), unless the authorization is terminated  or revoked sooner.       Influenza A by PCR NEGATIVE NEGATIVE Final   Influenza B by PCR NEGATIVE NEGATIVE Final    Comment: (NOTE) The Xpert Xpress SARS-CoV-2/FLU/RSV plus assay is intended as an aid in the diagnosis of influenza from Nasopharyngeal swab specimens and should not be  used as a sole basis for treatment. Nasal washings and aspirates are unacceptable for Xpert Xpress SARS-CoV-2/FLU/RSV testing.  Fact Sheet for Patients: BloggerCourse.com  Fact Sheet for Healthcare  Providers: SeriousBroker.it  This test is not yet approved or cleared by the [place] FDA and has been authorized for detection and/or diagnosis of SARS-CoV-2 by FDA under an Emergency Use Authorization (EUA). This EUA will remain in effect (meaning this test can be used) for the duration of the COVID-19 declaration under Section 564(b)(1) of the Act, 21 U.S.C. section 360bbb-3(b)(1), unless the authorization is terminated or revoked.     Resp Syncytial Virus by PCR NEGATIVE NEGATIVE Final    Comment: (NOTE) Fact Sheet for Patients: BloggerCourse.com  Fact Sheet for Healthcare Providers: SeriousBroker.it  This test is not yet approved or cleared by the [place] FDA and has been authorized for detection and/or diagnosis of SARS-CoV-2 by FDA under an Emergency Use Authorization (EUA). This EUA will remain in effect (meaning this test can be used) for the duration of the COVID-19 declaration under Section 564(b)(1) of the Act, 21 U.S.C. section 360bbb-3(b)(1), unless the authorization is terminated or revoked.  Performed at [facility], [address] [place], [place] [address]   Urine Culture (for pregnant, neutropenic or urologic patients or patients with an indwelling urinary catheter)     Status: None   Collection Time: 10/07/23  2:28 AM   Specimen: Urine, Clean Catch  Result Value Ref Range Status   Specimen Description   Final    URINE, CLEAN CATCH Performed at [facility], [address], [place], [place] [address]    Special Requests   Final    URINE, CLEAN CATCH Performed at [facility], [address], [place], [place] [address]    Culture   Final    NO GROWTH Performed at [facility], [address], [place], [place] [address]    Report Status 10/08/2023 FINAL  Final     Labs: BNP (last 3 results) No results for  input(s): "BNP" in the last 8760 hours. Basic Metabolic Panel: Recent Labs  Lab 10/06/23 1310 10/07/23 0723  NA 136 140  K 3.9 3.5  CL 98 103  CO2 27 25  GLUCOSE 116* 79  BUN 14 16  CREATININE 0.75 0.76  CALCIUM 9.5 8.8*  MG  --  1.9  PHOS  --  3.5   Liver Function Tests: Recent Labs  Lab 10/06/23 1310  AST 26  ALT 14  ALKPHOS 53  BILITOT 1.2  PROT 7.3  ALBUMIN 3.8   No results for input(s): "LIPASE", "AMYLASE" in the last 168 hours. No results for input(s): "AMMONIA" in the last 168 hours. CBC: Recent Labs  Lab 10/06/23 1310 10/07/23 0723  WBC 6.5 6.8  NEUTROABS 4.0  --   HGB 13.4 11.7*  HCT 40.2 37.2  MCV 90.3 93.5  PLT 221 196   Cardiac Enzymes: No results for input(s): "CKTOTAL", "CKMB", "CKMBINDEX", "TROPONINI" in the last 168 hours. BNP: Invalid input(s): "POCBNP" CBG: No results for input(s): "GLUCAP" in the last 168 hours. D-Dimer No results for input(s): "DDIMER" in the last 72 hours. Hgb A1c No results for input(s): "HGBA1C" in the last 72 hours. Lipid Profile No results for input(s): "CHOL", "HDL", "LDLCALC", "TRIG", "CHOLHDL", "LDLDIRECT" in the last 72 hours. Thyroid function studies No results for input(s): "TSH", "T4TOTAL", "T3FREE", "THYROIDAB" in the last 72 hours.  Invalid input(s): "FREET3" Anemia work up No  results for input(s): "VITAMINB12", "FOLATE", "FERRITIN", "TIBC", "IRON", "RETICCTPCT" in the last 72 hours.  Urinalysis    Component Value Date/Time   COLORURINE YELLOW 10/06/2023 1528   APPEARANCEUR CLEAR 10/06/2023 1528   LABSPEC 1.015 10/06/2023 1528   PHURINE 5.5 10/06/2023 1528   GLUCOSEU NEGATIVE 10/06/2023 1528   HGBUR NEGATIVE 10/06/2023 1528   BILIRUBINUR NEGATIVE 10/06/2023 1528   BILIRUBINUR negative 11/16/2022 1531   KETONESUR NEGATIVE 10/06/2023 1528   PROTEINUR NEGATIVE 10/06/2023 1528   UROBILINOGEN 0.2 11/16/2022 1531   NITRITE NEGATIVE 10/06/2023 1528   LEUKOCYTESUR SMALL (A) 10/06/2023 1528    Sepsis Labs Recent Labs  Lab 10/06/23 1310 10/07/23 0723  WBC 6.5 6.8   Microbiology Recent Results (from the past 240 hours)  Resp panel by RT-PCR (RSV, Flu A&B, Covid) Anterior Nasal Swab     Status: None   Collection Time: 10/06/23  1:10 PM   Specimen: Anterior Nasal Swab  Result Value Ref Range Status   SARS Coronavirus 2 by RT PCR NEGATIVE NEGATIVE Final    Comment: (NOTE) SARS-CoV-2 target nucleic acids are NOT DETECTED.  The SARS-CoV-2 RNA is generally detectable in upper respiratory specimens during the acute phase of infection. The lowest concentration of SARS-CoV-2 viral copies this assay can detect is 138 copies/mL. A negative result does not preclude SARS-Cov-2 infection and should not be used as the sole basis for treatment or other patient management decisions. A negative result may occur with  improper specimen collection/handling, submission of specimen other than nasopharyngeal swab, presence of viral mutation(s) within the areas targeted by this assay, and inadequate number of viral copies(<138 copies/mL). A negative result must be combined with clinical observations, patient history, and epidemiological information. The expected result is Negative.  Fact Sheet for Patients:  BloggerCourse.com  Fact Sheet for Healthcare Providers:  SeriousBroker.it  This test is no t yet approved or cleared by the [place] FDA and  has been authorized for detection and/or diagnosis of SARS-CoV-2 by FDA under an Emergency Use Authorization (EUA). This EUA will remain  in effect (meaning this test can be used) for the duration of the COVID-19 declaration under Section 564(b)(1) of the Act, 21 U.S.C.section 360bbb-3(b)(1), unless the authorization is terminated  or revoked sooner.       Influenza A by PCR NEGATIVE NEGATIVE Final   Influenza B by PCR NEGATIVE NEGATIVE Final    Comment: (NOTE) The Xpert Xpress  SARS-CoV-2/FLU/RSV plus assay is intended as an aid in the diagnosis of influenza from Nasopharyngeal swab specimens and should not be used as a sole basis for treatment. Nasal washings and aspirates are unacceptable for Xpert Xpress SARS-CoV-2/FLU/RSV testing.  Fact Sheet for Patients: BloggerCourse.com  Fact Sheet for Healthcare Providers: SeriousBroker.it  This test is not yet approved or cleared by the [place] FDA and has been authorized for detection and/or diagnosis of SARS-CoV-2 by FDA under an Emergency Use Authorization (EUA). This EUA will remain in effect (meaning this test can be used) for the duration of the COVID-19 declaration under Section 564(b)(1) of the Act, 21 U.S.C. section 360bbb-3(b)(1), unless the authorization is terminated or revoked.     Resp Syncytial Virus by PCR NEGATIVE NEGATIVE Final    Comment: (NOTE) Fact Sheet for Patients: BloggerCourse.com  Fact Sheet for Healthcare Providers: SeriousBroker.it  This test is not yet approved or cleared by the [place] FDA and has been authorized for detection and/or diagnosis of SARS-CoV-2 by FDA under an Emergency Use Authorization (EUA). This EUA will  remain in effect (meaning this test can be used) for the duration of the COVID-19 declaration under Section 564(b)(1) of the Act, 21 U.S.C. section 360bbb-3(b)(1), unless the authorization is terminated or revoked.  Performed at [facility], [address] [place], [place] [address]   Urine Culture (for pregnant, neutropenic or urologic patients or patients with an indwelling urinary catheter)     Status: None   Collection Time: 10/07/23  2:28 AM   Specimen: Urine, Clean Catch  Result Value Ref Range Status   Specimen Description   Final    URINE, CLEAN CATCH Performed at [facility], [address],  [place], [place] [address]    Special Requests   Final    URINE, CLEAN CATCH Performed at [facility], [address], [place], [place] [address]    Culture   Final    NO GROWTH Performed at [facility], [address] [place], [place] [address]    Report Status 10/08/2023 FINAL  Final    Please note: You were cared for by a hospitalist during your hospital stay. Once you are discharged, your primary care physician will handle any further medical issues. Please note that NO REFILLS for any discharge medications will be authorized once you are discharged, as it is imperative that you return to your primary care physician (or establish a relationship with a primary care physician if you do not have one) for your post hospital discharge needs so that they can reassess your need for medications and monitor your lab values.    Time coordinating discharge: 40 minutes  SIGNED:   Burnadette Pop, MD  [facility] 10/10/2023, 10:13 AM Pager [phone]  If 7PM-7AM, please contact night-coverage [email or web address] Password [email or web address]

## 2023-10-09 NOTE — Progress Notes (Signed)
Physical Therapy Treatment Patient Details Name: Carol [name] MRN: [id number] DOB: [birth year] Today's Date: 10/09/2023   History of Present Illness 89 y.o. female who presented to the ED after a fall at her ILF.  Pt sustained nondisplaced distal sacral fracture at approximately the S4 level, with edema in the presacral soft tissues.  Past medical history significant for atrial fibrillation on Eliquis, anemia, breast CA s/p remote double mastectomy, HTN, HLD, PSVT, CVA, dementia, bradycardia, mild cardiomyopathy (EF50-55% in 2024), and tricuspid regurgitation    PT Comments  Pt is progressing well with mobility, she tolerated increased ambulation distance of 150' with RW, no loss of balance, pt did require verbal cues for safe proximity to walker.     If plan is discharge home, recommend the following: A little help with walking and/or transfers;A little help with bathing/dressing/bathroom;Assistance with cooking/housework;Assist for transportation;Help with stairs or ramp for entrance   Can travel by private vehicle     Yes  Equipment Recommendations  Rolling walker (2 wheels)    Recommendations for Other Services       Precautions / Restrictions Precautions Precautions: Fall Restrictions Weight Bearing Restrictions Per Provider Order: No     Mobility  Bed Mobility Overal bed mobility: Needs Assistance Bed Mobility: Supine to Sit     Supine to sit: Min assist     General bed mobility comments: min A to raise trunk    Transfers Overall transfer level: Needs assistance Equipment used: Rolling walker (2 wheels) Transfers: Sit to/from Stand Sit to Stand: Contact guard assist           General transfer comment: multimodal cues for technique and safety    Ambulation/Gait Ambulation/Gait assistance: Contact guard assist Gait Distance (Feet): 150 Feet Assistive device: Rolling walker (2 wheels) Gait Pattern/deviations: Step-through pattern, Decreased stride  length, Trunk flexed Gait velocity: decr     General Gait Details: pt keeps RW too far forward (uses rollator at baseline), VCs for proximity to RW, SpO2 95% on room air walking, no loss of balance   Stairs             Wheelchair Mobility     Tilt Bed    Modified Rankin (Stroke Patients Only)       Balance Overall balance assessment: History of Falls, Needs assistance Sitting-balance support: No upper extremity supported, Feet supported Sitting balance-Leahy Scale: Good     Standing balance support: Bilateral upper extremity supported, During functional activity, Reliant on assistive device for balance Standing balance-Leahy Scale: Poor                              Communication    Cognition Arousal: Alert Behavior During Therapy: WFL for tasks assessed/performed                                    Cueing    Exercises      General Comments        Pertinent Vitals/Pain Pain Assessment Faces Pain Scale: Hurts little more Pain Location: "tailbone" with bed mobility/sitting Pain Descriptors / Indicators: Grimacing, Guarding Pain Intervention(s): Limited activity within patient's tolerance, Monitored during session, Repositioned    Home Living                          Prior Function  PT Goals (current goals can now be found in the care plan section) Acute Rehab PT Goals PT Goal Formulation: With patient/family Time For Goal Achievement: 10/21/23 Potential to Achieve Goals: Good Progress towards PT goals: Progressing toward goals    Frequency    Min 1X/week      PT Plan      Co-evaluation              AM-PAC PT "6 Clicks" Mobility   Outcome Measure  Help needed turning from your back to your side while in a flat bed without using bedrails?: A Little Help needed moving from lying on your back to sitting on the side of a flat bed without using bedrails?: A Little Help needed moving to  and from a bed to a chair (including a wheelchair)?: A Little Help needed standing up from a chair using your arms (e.g., wheelchair or bedside chair)?: A Little Help needed to walk in hospital room?: A Little Help needed climbing 3-5 steps with a railing? : A Lot 6 Click Score: 17    End of Session Equipment Utilized During Treatment: Gait belt Activity Tolerance: Patient tolerated treatment well Patient left: in chair;with call bell/phone within reach;with chair alarm set;with family/visitor present Nurse Communication: Mobility status PT Visit Diagnosis: Difficulty in walking, not elsewhere classified (R26.2);History of falling (Z91.81);Unsteadiness on feet (R26.81)     Time: 1610-9604 PT Time Calculation (min) (ACUTE ONLY): 13 min  Charges:    $Gait Training: 8-22 mins PT General Charges $$ ACUTE PT VISIT: 1 Visit                     Ralene Bathe Kistler PT 10/09/2023  Acute Rehabilitation Services  Office [phone]

## 2023-10-10 ENCOUNTER — Ambulatory Visit: Payer: Medicare HMO

## 2023-10-10 ENCOUNTER — Other Ambulatory Visit (HOSPITAL_COMMUNITY): Payer: Self-pay

## 2023-10-10 MED ORDER — METOPROLOL SUCCINATE ER 25 MG PO TB24
12.5000 mg | ORAL_TABLET | Freq: Every day | ORAL | 0 refills | Status: DC
Start: 2023-10-10 — End: 2023-12-04
  Filled 2023-10-10: qty 15, 30d supply, fill #0

## 2023-10-10 MED ORDER — CYANOCOBALAMIN 1000 MCG PO TABS
1000.0000 ug | ORAL_TABLET | Freq: Every day | ORAL | 0 refills | Status: AC
  Filled 2023-10-10: qty 60, 60d supply, fill #0

## 2023-10-10 MED ORDER — SENNOSIDES-DOCUSATE SODIUM 8.6-50 MG PO TABS
1.0000 | ORAL_TABLET | Freq: Two times a day (BID) | ORAL | 0 refills | Status: AC
  Filled 2023-10-10 (×2): qty 14, 7d supply, fill #0

## 2023-10-10 MED ORDER — POLYETHYLENE GLYCOL 3350 17 GM/SCOOP PO POWD
17.0000 g | Freq: Every day | ORAL | 0 refills | Status: AC
  Filled 2023-10-10: qty 238, 14d supply, fill #0

## 2023-10-10 NOTE — Progress Notes (Signed)
Patient seen and examined at bedside today.  Hemodynamically stable.  Lying in bed.  Comfortable.  No new complaints today.  Long discussion held with brother at bedside.  He wants to take her home.  He does not like any facilities that were provided to him for choice.  He is interested in home health.  Discharge summary and orders already placed on 2/11.  No change in the medical management.

## 2023-10-10 NOTE — Progress Notes (Signed)
Telemetry called this RN to notify of patient's heart rate being in the 40s for over an hour. RN went to bedside to assess patient is not symptomatic, provider notified.

## 2023-10-10 NOTE — Progress Notes (Signed)
The patient is alert and oriented to self and place. She is out of bed 1 assist with walker, no change from am assessment. Discharge instructions were reviewed with the patients brother. He denied questions, or concerns at this time.   Telemetry discontinued and  monitor 74 returned to desk.

## 2023-10-11 ENCOUNTER — Ambulatory Visit: Payer: Medicare HMO | Admitting: Nurse Practitioner

## 2023-10-12 NOTE — Telephone Encounter (Signed)
Please see the MyChart message reply(ies) for my assessment and plan.   So sorry to hear about this recent hospital stay. I have reviewed the notes and I agree with current cardiac treatment. Continue with current discharged medications.    This patient gave consent for this Medical Advice Message and is aware that it may result in a bill to [employer], as well as the possibility of receiving a bill for a co-payment or deductible. They are an established patient, but are not seeking medical advice exclusively about a problem treated during an in person or video visit in the last seven days. I did not recommend an in person or video visit within seven days of my reply.    I spent a total of 6 minutes cumulative time within 7 days through [employer].  Donato Schultz, MD

## 2023-10-15 DIAGNOSIS — R4789 Other speech disturbances: Secondary | ICD-10-CM | POA: Diagnosis not present

## 2023-10-15 DIAGNOSIS — R488 Other symbolic dysfunctions: Secondary | ICD-10-CM | POA: Diagnosis not present

## 2023-10-16 DIAGNOSIS — M62551 Muscle wasting and atrophy, not elsewhere classified, right thigh: Secondary | ICD-10-CM | POA: Diagnosis not present

## 2023-10-16 DIAGNOSIS — R296 Repeated falls: Secondary | ICD-10-CM | POA: Diagnosis not present

## 2023-10-16 DIAGNOSIS — R2689 Other abnormalities of gait and mobility: Secondary | ICD-10-CM | POA: Diagnosis not present

## 2023-10-16 DIAGNOSIS — M62552 Muscle wasting and atrophy, not elsewhere classified, left thigh: Secondary | ICD-10-CM | POA: Diagnosis not present

## 2023-10-16 DIAGNOSIS — R278 Other lack of coordination: Secondary | ICD-10-CM | POA: Diagnosis not present

## 2023-10-17 DIAGNOSIS — R2689 Other abnormalities of gait and mobility: Secondary | ICD-10-CM | POA: Diagnosis not present

## 2023-10-17 DIAGNOSIS — R278 Other lack of coordination: Secondary | ICD-10-CM | POA: Diagnosis not present

## 2023-10-17 DIAGNOSIS — M62512 Muscle wasting and atrophy, not elsewhere classified, left shoulder: Secondary | ICD-10-CM | POA: Diagnosis not present

## 2023-10-17 DIAGNOSIS — M62511 Muscle wasting and atrophy, not elsewhere classified, right shoulder: Secondary | ICD-10-CM | POA: Diagnosis not present

## 2023-10-17 DIAGNOSIS — R296 Repeated falls: Secondary | ICD-10-CM | POA: Diagnosis not present

## 2023-10-18 ENCOUNTER — Ambulatory Visit: Payer: Medicare HMO | Admitting: Family Medicine

## 2023-10-18 ENCOUNTER — Encounter: Payer: Self-pay | Admitting: Family Medicine

## 2023-10-18 VITALS — BP 108/68 | HR 56 | Ht 62.0 in | Wt 143.6 lb

## 2023-10-18 DIAGNOSIS — S3210XD Unspecified fracture of sacrum, subsequent encounter for fracture with routine healing: Secondary | ICD-10-CM

## 2023-10-18 DIAGNOSIS — M7989 Other specified soft tissue disorders: Secondary | ICD-10-CM | POA: Diagnosis not present

## 2023-10-18 NOTE — Patient Instructions (Addendum)
It was good to see you today- I am glad you are feeling better and back home again Please let me know if the arm swelling is getting worse again.  I am eager to hear if Dr Anne Fu has any ideas about your arm  Be sure to take the B12 daily - we can recheck your level in a few months Please see me in about 4 months and take care!

## 2023-10-18 NOTE — Progress Notes (Signed)
[facility] [address] [place], [place] [address] [phone]  Date:  10/18/2023   Name:  Carol [name]   DOB:  Oct 11, 1927   MRN:  [id number]  PCP:  Pearline Cables, MD    Chief Complaint: Hospitalization Follow-up (Pt states she has no concerns /L arm is still swollen )   History of Present Illness:  Carol [name] is a 89 y.o. very pleasant female patient who presents with the following:  Patient seen today for follow-up- history of a fib on eliquis, hyperlipidemia, HTN, mild dementia, breast cancer, prior stroke, vitamin D deficiency, pulmonary edema/fluid overload   I saw her most recently at the end of January-at that time we were dealing with swelling of her left arm.  Evaluation including ultrasound, plain films, and CT angiogram of the chest were all negative.  Our working diagnosis was lymphedema from previous history of breast cancer and axillary lymph node dissection  She then was admitted to the hospital 2/8 through 2/12 after a fall at her independent living facility and sacral fracture Brief/Interim Summary: Patient is a 59 female with history of paroxysmal A-fib, breast cancer status post double mastectomy, hypertension, hyperlipidemia, CVA, dementia tricuspid valve regurgitation who presented to the Emergency Department after she fell at independent living facility followed by pain on her tailbone.  On presentation, she was hemodynamically stable.  Chest x-ray, CT head, CT cervical spine did not show any acute findings.  CT chest/abdomen/pelvis showed nondisplaced distal sacral fracture at S4 level with edema in the presacral soft tissues.  Admitted for pain management, PT evaluation.  Pain is significantly better now.  Remains hemodynamically stable.  PT recommending SNF on discharge.  Family wants to take her home.  Home health ordered Following problems were addressed during the  hospitalization: Falls/generalized weakness/nondisplaced sacral fracture: Presented with unwitnessed fall at independent living facility.  Forgot to use her walker.  Imaging showed mild nondisplaced distal sacral fracture at S4 level with edema in the presacral soft tissues.  Currently pain well-controlled.  PT/OT evaluation done, recommended SNF, unable to take her home. Suspected UTI: Denies dysuria.  No fever or leukocytosis.  Urine culture did not show any growth.  Antibiotics discontinued Paroxysmal A-fib: Currently in normal sinus rhythm.  On Eliquis and Toprol.  Monitor on telemetry.  Decreased the dose of Toprol due to mild hypotension History of cardiomyopathy/tricuspid valve regurgitation: Last echo done on May last year showed EF of 50 to 55%, moderate pulmonary artery hypertension, severe TVR, moderate AVR.  on Lasix low-dose and Toprol at home.  Can resume Lasix on discharge. we recommend follow-up with cardiology as an outpatient Dementia: Confused to time.  Oriented to person and place.  Currently at baseline.  Continue delirium precaution, frequent reorientation.  Continue Aricept, Seroquel Low vitamin B12: Given a IM dose, followed by daily  oral supplementation Hypertension: On losartan and Toprol at home, Toprol being continued  for now at low-dose.  Losartan held due to soft blood pressure.   Carol [name] is back home and doing fine She is in her apt again and notes she is able to do her ADLs She notes minimal pain from her fracture She is no longer on losartan- her BP was soft while intp.  Asked them to continue to hold this medication  They will see Dr Anne Fu in about 2 weeks- they will ask him about her arm.  We think this is likely  due to lymphedema but would like to get cardiology opinion  Patient Active Problem List   Diagnosis Date Noted   Acute cystitis without hematuria 10/07/2023   Generalized weakness 10/07/2023   Nausea & vomiting 10/06/2023   Fall at home, initial  encounter 10/06/2023   Pressure injury of skin 10/06/2023   Paroxysmal atrial fibrillation (HCC) 08/13/2018   Right knee pain 11/16/2017   Osteopenia 11/12/2016   Mild cognitive disorder 10/10/2016   Chronic anticoagulation 10/04/2016   History of atrial fibrillation 10/04/2016   Essential hypertension, benign 09/01/2016   Paroxysmal supraventricular tachycardia (HCC) 09/01/2016   Hyperlipidemia 09/01/2016    Past Medical History:  Diagnosis Date   Anemia    Blood transfusion without reported diagnosis    Breast cancer (HCC)    Cardiomyopathy (HCC)    Cataract    Dyslipidemia    Hyperlipidemia    Hypertension    Paroxysmal supraventricular tachycardia (HCC)    Permanent atrial fibrillation (HCC)    Stroke (HCC)     Past Surgical History:  Procedure Laterality Date   MASTECTOMY     TONSILLECTOMY     WRIST SURGERY      Social History   Tobacco Use   Smoking status: Never   Smokeless tobacco: Never  Vaping Use   Vaping status: Never Used  Substance Use Topics   Alcohol use: No   Drug use: No    Family History  Problem Relation Age of Onset   Epilepsy Mother    Stroke Father    Heart disease Maternal Aunt     Allergies  Allergen Reactions   Tramadol Other (See Comments)    seizure   Sertraline Other (See Comments)   Zoloft [Sertraline Hcl]     headache    Medication list has been reviewed and updated.  Current Outpatient Medications on File Prior to Visit  Medication Sig Dispense Refill   acetaminophen (TYLENOL) 500 MG tablet Take 500 mg by mouth every 8 (eight) hours as needed for moderate pain (pain score 4-6) or mild pain (pain score 1-3).     Cholecalciferol (VITAMIN D3) 1000 units CAPS Take 1,000 Units by mouth daily.     cyanocobalamin 1000 MCG tablet Take 1 tablet (1,000 mcg total) by mouth daily. 60 tablet 0   donepezil (ARICEPT ODT) 5 MG disintegrating tablet DISSOLVE 1 TABLET ON THE TONGUE AT BEDTIME (Patient taking differently: Take 5 mg by  mouth at bedtime.) 90 tablet 3   ELIQUIS 5 MG TABS tablet TAKE 1 TABLET TWICE DAILY 180 tablet 3   furosemide (LASIX) 20 MG tablet TAKE 1/2 TABLET EVERY DAY (Patient taking differently: Take 20 mg by mouth daily.) 45 tablet 3   metoprolol succinate (TOPROL-XL) 25 MG 24 hr tablet Take 0.5 tablets (12.5 mg total) by mouth daily. 15 tablet 0   polyethylene glycol powder (GLYCOLAX/MIRALAX) 17 GM/SCOOP powder Take 17 grams mixed in lquid by mouth daily for 14 days. 238 g 0   QUEtiapine (SEROQUEL) 25 MG tablet TAKE 1 TABLET BY MOUTH AT BEDTIME. INCREASE TO TWICE DAILY AS DIRECTED BY MD (Patient taking differently: Take 75 mg by mouth daily.) 180 tablet 3   No current facility-administered medications on file prior to visit.    Review of Systems:  As per HPI- otherwise negative.   Physical Examination: Vitals:   10/18/23 1521  BP: 108/68  Pulse: (!) 56  SpO2: 97%   Vitals:   10/18/23 1521  Weight: 143 lb 9.6 oz (65.1 kg)  Height: 5\' 2"  (1.575 m)   Body mass index is 26.26 kg/m. Ideal Body Weight: Weight in (lb) to have BMI = 25: 136.4  GEN: no acute distress.  Looks well, accompanied by her brother Sherrill Raring today HEENT: Atraumatic, Normocephalic.  Ears and Nose: No external deformity. CV: RRR, No M/G/R. No JVD. No thrill. No extra heart sounds. PULM: CTA B, no wheezes, crackles, rhonchi. No retractions. No resp. distress. No accessory muscle use. ABD: S, NT, ND EXTR: No c/c/e PSYCH: Normally interactive. Conversant.  Left arm is still swollen but does appear improved  Still non tender with normal ROM Hand appears basically back to normal Pt notes minimal tenderness over her sacrum, no bruising  Walks with a rolling walker- getting around well   Assessment and Plan: Left arm swelling  Closed fracture of sacrum with routine healing, unspecified portion of sacrum, subsequent encounter  Doing well from recent sacral fracture, function is about back to baseline They are trying to  make sure she uses her walker to reduce fall risk Left arm swelling is stable to improved- they will ask Dr Anne Fu if he has any ideas about this at upcoming visit They are taking B12- recheck level when I see her next in about 4 months  Signed Abbe Amsterdam, MD

## 2023-10-19 ENCOUNTER — Encounter: Payer: Self-pay | Admitting: Physician Assistant

## 2023-10-19 DIAGNOSIS — M62551 Muscle wasting and atrophy, not elsewhere classified, right thigh: Secondary | ICD-10-CM | POA: Diagnosis not present

## 2023-10-19 DIAGNOSIS — R4789 Other speech disturbances: Secondary | ICD-10-CM | POA: Diagnosis not present

## 2023-10-19 DIAGNOSIS — R488 Other symbolic dysfunctions: Secondary | ICD-10-CM | POA: Diagnosis not present

## 2023-10-19 DIAGNOSIS — M62552 Muscle wasting and atrophy, not elsewhere classified, left thigh: Secondary | ICD-10-CM | POA: Diagnosis not present

## 2023-10-19 DIAGNOSIS — M62512 Muscle wasting and atrophy, not elsewhere classified, left shoulder: Secondary | ICD-10-CM | POA: Diagnosis not present

## 2023-10-19 DIAGNOSIS — R278 Other lack of coordination: Secondary | ICD-10-CM | POA: Diagnosis not present

## 2023-10-19 DIAGNOSIS — R2689 Other abnormalities of gait and mobility: Secondary | ICD-10-CM | POA: Diagnosis not present

## 2023-10-19 DIAGNOSIS — M62511 Muscle wasting and atrophy, not elsewhere classified, right shoulder: Secondary | ICD-10-CM | POA: Diagnosis not present

## 2023-10-19 DIAGNOSIS — R296 Repeated falls: Secondary | ICD-10-CM | POA: Diagnosis not present

## 2023-10-22 DIAGNOSIS — R488 Other symbolic dysfunctions: Secondary | ICD-10-CM | POA: Diagnosis not present

## 2023-10-22 DIAGNOSIS — R278 Other lack of coordination: Secondary | ICD-10-CM | POA: Diagnosis not present

## 2023-10-22 DIAGNOSIS — R296 Repeated falls: Secondary | ICD-10-CM | POA: Diagnosis not present

## 2023-10-22 DIAGNOSIS — M62512 Muscle wasting and atrophy, not elsewhere classified, left shoulder: Secondary | ICD-10-CM | POA: Diagnosis not present

## 2023-10-22 DIAGNOSIS — M62511 Muscle wasting and atrophy, not elsewhere classified, right shoulder: Secondary | ICD-10-CM | POA: Diagnosis not present

## 2023-10-22 DIAGNOSIS — R4789 Other speech disturbances: Secondary | ICD-10-CM | POA: Diagnosis not present

## 2023-10-22 DIAGNOSIS — R2689 Other abnormalities of gait and mobility: Secondary | ICD-10-CM | POA: Diagnosis not present

## 2023-10-24 DIAGNOSIS — M62552 Muscle wasting and atrophy, not elsewhere classified, left thigh: Secondary | ICD-10-CM | POA: Diagnosis not present

## 2023-10-24 DIAGNOSIS — M62551 Muscle wasting and atrophy, not elsewhere classified, right thigh: Secondary | ICD-10-CM | POA: Diagnosis not present

## 2023-10-24 DIAGNOSIS — R296 Repeated falls: Secondary | ICD-10-CM | POA: Diagnosis not present

## 2023-10-24 DIAGNOSIS — R2689 Other abnormalities of gait and mobility: Secondary | ICD-10-CM | POA: Diagnosis not present

## 2023-10-24 DIAGNOSIS — R4789 Other speech disturbances: Secondary | ICD-10-CM | POA: Diagnosis not present

## 2023-10-24 DIAGNOSIS — R488 Other symbolic dysfunctions: Secondary | ICD-10-CM | POA: Diagnosis not present

## 2023-10-24 DIAGNOSIS — R278 Other lack of coordination: Secondary | ICD-10-CM | POA: Diagnosis not present

## 2023-10-25 DIAGNOSIS — M62512 Muscle wasting and atrophy, not elsewhere classified, left shoulder: Secondary | ICD-10-CM | POA: Diagnosis not present

## 2023-10-25 DIAGNOSIS — R2689 Other abnormalities of gait and mobility: Secondary | ICD-10-CM | POA: Diagnosis not present

## 2023-10-25 DIAGNOSIS — R278 Other lack of coordination: Secondary | ICD-10-CM | POA: Diagnosis not present

## 2023-10-25 DIAGNOSIS — R296 Repeated falls: Secondary | ICD-10-CM | POA: Diagnosis not present

## 2023-10-25 DIAGNOSIS — M62511 Muscle wasting and atrophy, not elsewhere classified, right shoulder: Secondary | ICD-10-CM | POA: Diagnosis not present

## 2023-10-26 DIAGNOSIS — R296 Repeated falls: Secondary | ICD-10-CM | POA: Diagnosis not present

## 2023-10-26 DIAGNOSIS — R278 Other lack of coordination: Secondary | ICD-10-CM | POA: Diagnosis not present

## 2023-10-26 DIAGNOSIS — M62552 Muscle wasting and atrophy, not elsewhere classified, left thigh: Secondary | ICD-10-CM | POA: Diagnosis not present

## 2023-10-26 DIAGNOSIS — M62551 Muscle wasting and atrophy, not elsewhere classified, right thigh: Secondary | ICD-10-CM | POA: Diagnosis not present

## 2023-10-26 DIAGNOSIS — R2689 Other abnormalities of gait and mobility: Secondary | ICD-10-CM | POA: Diagnosis not present

## 2023-10-29 DIAGNOSIS — M62551 Muscle wasting and atrophy, not elsewhere classified, right thigh: Secondary | ICD-10-CM | POA: Diagnosis not present

## 2023-10-29 DIAGNOSIS — R2689 Other abnormalities of gait and mobility: Secondary | ICD-10-CM | POA: Diagnosis not present

## 2023-10-29 DIAGNOSIS — R4789 Other speech disturbances: Secondary | ICD-10-CM | POA: Diagnosis not present

## 2023-10-29 DIAGNOSIS — M62552 Muscle wasting and atrophy, not elsewhere classified, left thigh: Secondary | ICD-10-CM | POA: Diagnosis not present

## 2023-10-29 DIAGNOSIS — R488 Other symbolic dysfunctions: Secondary | ICD-10-CM | POA: Diagnosis not present

## 2023-10-29 DIAGNOSIS — R296 Repeated falls: Secondary | ICD-10-CM | POA: Diagnosis not present

## 2023-10-29 DIAGNOSIS — R278 Other lack of coordination: Secondary | ICD-10-CM | POA: Diagnosis not present

## 2023-10-30 DIAGNOSIS — R278 Other lack of coordination: Secondary | ICD-10-CM | POA: Diagnosis not present

## 2023-10-30 DIAGNOSIS — M62511 Muscle wasting and atrophy, not elsewhere classified, right shoulder: Secondary | ICD-10-CM | POA: Diagnosis not present

## 2023-10-30 DIAGNOSIS — R296 Repeated falls: Secondary | ICD-10-CM | POA: Diagnosis not present

## 2023-10-30 DIAGNOSIS — R2689 Other abnormalities of gait and mobility: Secondary | ICD-10-CM | POA: Diagnosis not present

## 2023-10-30 DIAGNOSIS — M62512 Muscle wasting and atrophy, not elsewhere classified, left shoulder: Secondary | ICD-10-CM | POA: Diagnosis not present

## 2023-10-30 NOTE — Progress Notes (Signed)
 My response to the following coding query: Please clarify the medical condition(s) necessitating the order for SARS,RSV,Flu A&B   Evaluate for viral illness

## 2023-10-31 DIAGNOSIS — R4789 Other speech disturbances: Secondary | ICD-10-CM | POA: Diagnosis not present

## 2023-10-31 DIAGNOSIS — M62552 Muscle wasting and atrophy, not elsewhere classified, left thigh: Secondary | ICD-10-CM | POA: Diagnosis not present

## 2023-10-31 DIAGNOSIS — R2689 Other abnormalities of gait and mobility: Secondary | ICD-10-CM | POA: Diagnosis not present

## 2023-10-31 DIAGNOSIS — R296 Repeated falls: Secondary | ICD-10-CM | POA: Diagnosis not present

## 2023-10-31 DIAGNOSIS — R278 Other lack of coordination: Secondary | ICD-10-CM | POA: Diagnosis not present

## 2023-10-31 DIAGNOSIS — M62551 Muscle wasting and atrophy, not elsewhere classified, right thigh: Secondary | ICD-10-CM | POA: Diagnosis not present

## 2023-10-31 DIAGNOSIS — R488 Other symbolic dysfunctions: Secondary | ICD-10-CM | POA: Diagnosis not present

## 2023-11-01 ENCOUNTER — Ambulatory Visit: Payer: Medicare HMO | Admitting: Physician Assistant

## 2023-11-02 DIAGNOSIS — R2689 Other abnormalities of gait and mobility: Secondary | ICD-10-CM | POA: Diagnosis not present

## 2023-11-02 DIAGNOSIS — M62512 Muscle wasting and atrophy, not elsewhere classified, left shoulder: Secondary | ICD-10-CM | POA: Diagnosis not present

## 2023-11-02 DIAGNOSIS — R278 Other lack of coordination: Secondary | ICD-10-CM | POA: Diagnosis not present

## 2023-11-02 DIAGNOSIS — M62511 Muscle wasting and atrophy, not elsewhere classified, right shoulder: Secondary | ICD-10-CM | POA: Diagnosis not present

## 2023-11-02 DIAGNOSIS — R296 Repeated falls: Secondary | ICD-10-CM | POA: Diagnosis not present

## 2023-11-04 DIAGNOSIS — R488 Other symbolic dysfunctions: Secondary | ICD-10-CM | POA: Diagnosis not present

## 2023-11-04 DIAGNOSIS — R4789 Other speech disturbances: Secondary | ICD-10-CM | POA: Diagnosis not present

## 2023-11-05 DIAGNOSIS — M62511 Muscle wasting and atrophy, not elsewhere classified, right shoulder: Secondary | ICD-10-CM | POA: Diagnosis not present

## 2023-11-05 DIAGNOSIS — R296 Repeated falls: Secondary | ICD-10-CM | POA: Diagnosis not present

## 2023-11-05 DIAGNOSIS — R2689 Other abnormalities of gait and mobility: Secondary | ICD-10-CM | POA: Diagnosis not present

## 2023-11-05 DIAGNOSIS — R278 Other lack of coordination: Secondary | ICD-10-CM | POA: Diagnosis not present

## 2023-11-05 DIAGNOSIS — M62512 Muscle wasting and atrophy, not elsewhere classified, left shoulder: Secondary | ICD-10-CM | POA: Diagnosis not present

## 2023-11-07 DIAGNOSIS — R4789 Other speech disturbances: Secondary | ICD-10-CM | POA: Diagnosis not present

## 2023-11-07 DIAGNOSIS — M62512 Muscle wasting and atrophy, not elsewhere classified, left shoulder: Secondary | ICD-10-CM | POA: Diagnosis not present

## 2023-11-07 DIAGNOSIS — R296 Repeated falls: Secondary | ICD-10-CM | POA: Diagnosis not present

## 2023-11-07 DIAGNOSIS — M62511 Muscle wasting and atrophy, not elsewhere classified, right shoulder: Secondary | ICD-10-CM | POA: Diagnosis not present

## 2023-11-07 DIAGNOSIS — R2689 Other abnormalities of gait and mobility: Secondary | ICD-10-CM | POA: Diagnosis not present

## 2023-11-07 DIAGNOSIS — R488 Other symbolic dysfunctions: Secondary | ICD-10-CM | POA: Diagnosis not present

## 2023-11-07 DIAGNOSIS — R278 Other lack of coordination: Secondary | ICD-10-CM | POA: Diagnosis not present

## 2023-11-08 DIAGNOSIS — R278 Other lack of coordination: Secondary | ICD-10-CM | POA: Diagnosis not present

## 2023-11-08 DIAGNOSIS — R296 Repeated falls: Secondary | ICD-10-CM | POA: Diagnosis not present

## 2023-11-08 DIAGNOSIS — M62552 Muscle wasting and atrophy, not elsewhere classified, left thigh: Secondary | ICD-10-CM | POA: Diagnosis not present

## 2023-11-08 DIAGNOSIS — M62551 Muscle wasting and atrophy, not elsewhere classified, right thigh: Secondary | ICD-10-CM | POA: Diagnosis not present

## 2023-11-08 DIAGNOSIS — R2689 Other abnormalities of gait and mobility: Secondary | ICD-10-CM | POA: Diagnosis not present

## 2023-11-09 DIAGNOSIS — M62552 Muscle wasting and atrophy, not elsewhere classified, left thigh: Secondary | ICD-10-CM | POA: Diagnosis not present

## 2023-11-09 DIAGNOSIS — R278 Other lack of coordination: Secondary | ICD-10-CM | POA: Diagnosis not present

## 2023-11-09 DIAGNOSIS — R296 Repeated falls: Secondary | ICD-10-CM | POA: Diagnosis not present

## 2023-11-09 DIAGNOSIS — M62551 Muscle wasting and atrophy, not elsewhere classified, right thigh: Secondary | ICD-10-CM | POA: Diagnosis not present

## 2023-11-09 DIAGNOSIS — R2689 Other abnormalities of gait and mobility: Secondary | ICD-10-CM | POA: Diagnosis not present

## 2023-11-12 DIAGNOSIS — R278 Other lack of coordination: Secondary | ICD-10-CM | POA: Diagnosis not present

## 2023-11-12 DIAGNOSIS — R488 Other symbolic dysfunctions: Secondary | ICD-10-CM | POA: Diagnosis not present

## 2023-11-12 DIAGNOSIS — R2689 Other abnormalities of gait and mobility: Secondary | ICD-10-CM | POA: Diagnosis not present

## 2023-11-12 DIAGNOSIS — M62512 Muscle wasting and atrophy, not elsewhere classified, left shoulder: Secondary | ICD-10-CM | POA: Diagnosis not present

## 2023-11-12 DIAGNOSIS — M62511 Muscle wasting and atrophy, not elsewhere classified, right shoulder: Secondary | ICD-10-CM | POA: Diagnosis not present

## 2023-11-12 DIAGNOSIS — R4789 Other speech disturbances: Secondary | ICD-10-CM | POA: Diagnosis not present

## 2023-11-12 DIAGNOSIS — R296 Repeated falls: Secondary | ICD-10-CM | POA: Diagnosis not present

## 2023-11-13 DIAGNOSIS — R296 Repeated falls: Secondary | ICD-10-CM | POA: Diagnosis not present

## 2023-11-13 DIAGNOSIS — R278 Other lack of coordination: Secondary | ICD-10-CM | POA: Diagnosis not present

## 2023-11-13 DIAGNOSIS — M62552 Muscle wasting and atrophy, not elsewhere classified, left thigh: Secondary | ICD-10-CM | POA: Diagnosis not present

## 2023-11-13 DIAGNOSIS — M62551 Muscle wasting and atrophy, not elsewhere classified, right thigh: Secondary | ICD-10-CM | POA: Diagnosis not present

## 2023-11-13 DIAGNOSIS — R2689 Other abnormalities of gait and mobility: Secondary | ICD-10-CM | POA: Diagnosis not present

## 2023-11-14 DIAGNOSIS — R488 Other symbolic dysfunctions: Secondary | ICD-10-CM | POA: Diagnosis not present

## 2023-11-14 DIAGNOSIS — R296 Repeated falls: Secondary | ICD-10-CM | POA: Diagnosis not present

## 2023-11-14 DIAGNOSIS — M62512 Muscle wasting and atrophy, not elsewhere classified, left shoulder: Secondary | ICD-10-CM | POA: Diagnosis not present

## 2023-11-14 DIAGNOSIS — M62511 Muscle wasting and atrophy, not elsewhere classified, right shoulder: Secondary | ICD-10-CM | POA: Diagnosis not present

## 2023-11-14 DIAGNOSIS — R278 Other lack of coordination: Secondary | ICD-10-CM | POA: Diagnosis not present

## 2023-11-14 DIAGNOSIS — R2689 Other abnormalities of gait and mobility: Secondary | ICD-10-CM | POA: Diagnosis not present

## 2023-11-14 DIAGNOSIS — R4789 Other speech disturbances: Secondary | ICD-10-CM | POA: Diagnosis not present

## 2023-11-16 DIAGNOSIS — R2689 Other abnormalities of gait and mobility: Secondary | ICD-10-CM | POA: Diagnosis not present

## 2023-11-16 DIAGNOSIS — R278 Other lack of coordination: Secondary | ICD-10-CM | POA: Diagnosis not present

## 2023-11-16 DIAGNOSIS — M62552 Muscle wasting and atrophy, not elsewhere classified, left thigh: Secondary | ICD-10-CM | POA: Diagnosis not present

## 2023-11-16 DIAGNOSIS — R296 Repeated falls: Secondary | ICD-10-CM | POA: Diagnosis not present

## 2023-11-16 DIAGNOSIS — M62551 Muscle wasting and atrophy, not elsewhere classified, right thigh: Secondary | ICD-10-CM | POA: Diagnosis not present

## 2023-11-19 DIAGNOSIS — R4789 Other speech disturbances: Secondary | ICD-10-CM | POA: Diagnosis not present

## 2023-11-19 DIAGNOSIS — R278 Other lack of coordination: Secondary | ICD-10-CM | POA: Diagnosis not present

## 2023-11-19 DIAGNOSIS — R488 Other symbolic dysfunctions: Secondary | ICD-10-CM | POA: Diagnosis not present

## 2023-11-19 DIAGNOSIS — M62511 Muscle wasting and atrophy, not elsewhere classified, right shoulder: Secondary | ICD-10-CM | POA: Diagnosis not present

## 2023-11-19 DIAGNOSIS — R2689 Other abnormalities of gait and mobility: Secondary | ICD-10-CM | POA: Diagnosis not present

## 2023-11-19 DIAGNOSIS — R296 Repeated falls: Secondary | ICD-10-CM | POA: Diagnosis not present

## 2023-11-19 DIAGNOSIS — M62512 Muscle wasting and atrophy, not elsewhere classified, left shoulder: Secondary | ICD-10-CM | POA: Diagnosis not present

## 2023-11-20 DIAGNOSIS — M62551 Muscle wasting and atrophy, not elsewhere classified, right thigh: Secondary | ICD-10-CM | POA: Diagnosis not present

## 2023-11-20 DIAGNOSIS — R2689 Other abnormalities of gait and mobility: Secondary | ICD-10-CM | POA: Diagnosis not present

## 2023-11-20 DIAGNOSIS — R278 Other lack of coordination: Secondary | ICD-10-CM | POA: Diagnosis not present

## 2023-11-20 DIAGNOSIS — M62552 Muscle wasting and atrophy, not elsewhere classified, left thigh: Secondary | ICD-10-CM | POA: Diagnosis not present

## 2023-11-20 DIAGNOSIS — R296 Repeated falls: Secondary | ICD-10-CM | POA: Diagnosis not present

## 2023-11-22 DIAGNOSIS — R278 Other lack of coordination: Secondary | ICD-10-CM | POA: Diagnosis not present

## 2023-11-22 DIAGNOSIS — R2689 Other abnormalities of gait and mobility: Secondary | ICD-10-CM | POA: Diagnosis not present

## 2023-11-22 DIAGNOSIS — R296 Repeated falls: Secondary | ICD-10-CM | POA: Diagnosis not present

## 2023-11-22 DIAGNOSIS — M62512 Muscle wasting and atrophy, not elsewhere classified, left shoulder: Secondary | ICD-10-CM | POA: Diagnosis not present

## 2023-11-22 DIAGNOSIS — M62511 Muscle wasting and atrophy, not elsewhere classified, right shoulder: Secondary | ICD-10-CM | POA: Diagnosis not present

## 2023-11-23 DIAGNOSIS — R2689 Other abnormalities of gait and mobility: Secondary | ICD-10-CM | POA: Diagnosis not present

## 2023-11-23 DIAGNOSIS — R296 Repeated falls: Secondary | ICD-10-CM | POA: Diagnosis not present

## 2023-11-23 DIAGNOSIS — M62551 Muscle wasting and atrophy, not elsewhere classified, right thigh: Secondary | ICD-10-CM | POA: Diagnosis not present

## 2023-11-23 DIAGNOSIS — R488 Other symbolic dysfunctions: Secondary | ICD-10-CM | POA: Diagnosis not present

## 2023-11-23 DIAGNOSIS — R278 Other lack of coordination: Secondary | ICD-10-CM | POA: Diagnosis not present

## 2023-11-23 DIAGNOSIS — R4789 Other speech disturbances: Secondary | ICD-10-CM | POA: Diagnosis not present

## 2023-11-23 DIAGNOSIS — M62552 Muscle wasting and atrophy, not elsewhere classified, left thigh: Secondary | ICD-10-CM | POA: Diagnosis not present

## 2023-11-26 DIAGNOSIS — R4789 Other speech disturbances: Secondary | ICD-10-CM | POA: Diagnosis not present

## 2023-11-26 DIAGNOSIS — M62512 Muscle wasting and atrophy, not elsewhere classified, left shoulder: Secondary | ICD-10-CM | POA: Diagnosis not present

## 2023-11-26 DIAGNOSIS — R488 Other symbolic dysfunctions: Secondary | ICD-10-CM | POA: Diagnosis not present

## 2023-11-26 DIAGNOSIS — M62511 Muscle wasting and atrophy, not elsewhere classified, right shoulder: Secondary | ICD-10-CM | POA: Diagnosis not present

## 2023-11-26 DIAGNOSIS — R278 Other lack of coordination: Secondary | ICD-10-CM | POA: Diagnosis not present

## 2023-11-26 DIAGNOSIS — R296 Repeated falls: Secondary | ICD-10-CM | POA: Diagnosis not present

## 2023-11-26 DIAGNOSIS — R2689 Other abnormalities of gait and mobility: Secondary | ICD-10-CM | POA: Diagnosis not present

## 2023-11-27 DIAGNOSIS — M62551 Muscle wasting and atrophy, not elsewhere classified, right thigh: Secondary | ICD-10-CM | POA: Diagnosis not present

## 2023-11-27 DIAGNOSIS — R296 Repeated falls: Secondary | ICD-10-CM | POA: Diagnosis not present

## 2023-11-27 DIAGNOSIS — R278 Other lack of coordination: Secondary | ICD-10-CM | POA: Diagnosis not present

## 2023-11-27 DIAGNOSIS — M62552 Muscle wasting and atrophy, not elsewhere classified, left thigh: Secondary | ICD-10-CM | POA: Diagnosis not present

## 2023-11-27 DIAGNOSIS — M62512 Muscle wasting and atrophy, not elsewhere classified, left shoulder: Secondary | ICD-10-CM | POA: Diagnosis not present

## 2023-11-27 DIAGNOSIS — M62511 Muscle wasting and atrophy, not elsewhere classified, right shoulder: Secondary | ICD-10-CM | POA: Diagnosis not present

## 2023-11-27 DIAGNOSIS — R2689 Other abnormalities of gait and mobility: Secondary | ICD-10-CM | POA: Diagnosis not present

## 2023-11-28 DIAGNOSIS — R4789 Other speech disturbances: Secondary | ICD-10-CM | POA: Diagnosis not present

## 2023-11-28 DIAGNOSIS — R488 Other symbolic dysfunctions: Secondary | ICD-10-CM | POA: Diagnosis not present

## 2023-11-30 DIAGNOSIS — R278 Other lack of coordination: Secondary | ICD-10-CM | POA: Diagnosis not present

## 2023-11-30 DIAGNOSIS — M62552 Muscle wasting and atrophy, not elsewhere classified, left thigh: Secondary | ICD-10-CM | POA: Diagnosis not present

## 2023-11-30 DIAGNOSIS — M62551 Muscle wasting and atrophy, not elsewhere classified, right thigh: Secondary | ICD-10-CM | POA: Diagnosis not present

## 2023-11-30 DIAGNOSIS — R296 Repeated falls: Secondary | ICD-10-CM | POA: Diagnosis not present

## 2023-11-30 DIAGNOSIS — R2689 Other abnormalities of gait and mobility: Secondary | ICD-10-CM | POA: Diagnosis not present

## 2023-12-02 ENCOUNTER — Encounter: Payer: Self-pay | Admitting: Cardiology

## 2023-12-03 DIAGNOSIS — R4789 Other speech disturbances: Secondary | ICD-10-CM | POA: Diagnosis not present

## 2023-12-03 DIAGNOSIS — M62512 Muscle wasting and atrophy, not elsewhere classified, left shoulder: Secondary | ICD-10-CM | POA: Diagnosis not present

## 2023-12-03 DIAGNOSIS — R2689 Other abnormalities of gait and mobility: Secondary | ICD-10-CM | POA: Diagnosis not present

## 2023-12-03 DIAGNOSIS — R488 Other symbolic dysfunctions: Secondary | ICD-10-CM | POA: Diagnosis not present

## 2023-12-03 DIAGNOSIS — R296 Repeated falls: Secondary | ICD-10-CM | POA: Diagnosis not present

## 2023-12-03 DIAGNOSIS — R278 Other lack of coordination: Secondary | ICD-10-CM | POA: Diagnosis not present

## 2023-12-03 DIAGNOSIS — M62511 Muscle wasting and atrophy, not elsewhere classified, right shoulder: Secondary | ICD-10-CM | POA: Diagnosis not present

## 2023-12-04 ENCOUNTER — Telehealth: Payer: Self-pay | Admitting: Family Medicine

## 2023-12-04 ENCOUNTER — Other Ambulatory Visit: Payer: Self-pay | Admitting: Cardiology

## 2023-12-04 DIAGNOSIS — R278 Other lack of coordination: Secondary | ICD-10-CM | POA: Diagnosis not present

## 2023-12-04 DIAGNOSIS — R296 Repeated falls: Secondary | ICD-10-CM | POA: Diagnosis not present

## 2023-12-04 DIAGNOSIS — M62551 Muscle wasting and atrophy, not elsewhere classified, right thigh: Secondary | ICD-10-CM | POA: Diagnosis not present

## 2023-12-04 DIAGNOSIS — M62552 Muscle wasting and atrophy, not elsewhere classified, left thigh: Secondary | ICD-10-CM | POA: Diagnosis not present

## 2023-12-04 DIAGNOSIS — R2689 Other abnormalities of gait and mobility: Secondary | ICD-10-CM | POA: Diagnosis not present

## 2023-12-04 NOTE — Telephone Encounter (Signed)
 Copied from CRM [id number]. Topic: Medicare AWV >> Dec 04, 2023 11:03 AM Payton Doughty wrote: Reason for CRM: Called 12/03/2023 to sched AWV   Verlee Rossetti; Care Guide Ambulatory Clinical Support [facility] Direct Dial: [phone]

## 2023-12-05 DIAGNOSIS — R488 Other symbolic dysfunctions: Secondary | ICD-10-CM | POA: Diagnosis not present

## 2023-12-05 DIAGNOSIS — M62551 Muscle wasting and atrophy, not elsewhere classified, right thigh: Secondary | ICD-10-CM | POA: Diagnosis not present

## 2023-12-05 DIAGNOSIS — R296 Repeated falls: Secondary | ICD-10-CM | POA: Diagnosis not present

## 2023-12-05 DIAGNOSIS — M62552 Muscle wasting and atrophy, not elsewhere classified, left thigh: Secondary | ICD-10-CM | POA: Diagnosis not present

## 2023-12-05 DIAGNOSIS — R2689 Other abnormalities of gait and mobility: Secondary | ICD-10-CM | POA: Diagnosis not present

## 2023-12-05 DIAGNOSIS — R278 Other lack of coordination: Secondary | ICD-10-CM | POA: Diagnosis not present

## 2023-12-05 DIAGNOSIS — R4789 Other speech disturbances: Secondary | ICD-10-CM | POA: Diagnosis not present

## 2023-12-07 DIAGNOSIS — R296 Repeated falls: Secondary | ICD-10-CM | POA: Diagnosis not present

## 2023-12-07 DIAGNOSIS — M62512 Muscle wasting and atrophy, not elsewhere classified, left shoulder: Secondary | ICD-10-CM | POA: Diagnosis not present

## 2023-12-07 DIAGNOSIS — R278 Other lack of coordination: Secondary | ICD-10-CM | POA: Diagnosis not present

## 2023-12-07 DIAGNOSIS — R2689 Other abnormalities of gait and mobility: Secondary | ICD-10-CM | POA: Diagnosis not present

## 2023-12-07 DIAGNOSIS — M62511 Muscle wasting and atrophy, not elsewhere classified, right shoulder: Secondary | ICD-10-CM | POA: Diagnosis not present

## 2023-12-10 DIAGNOSIS — R296 Repeated falls: Secondary | ICD-10-CM | POA: Diagnosis not present

## 2023-12-10 DIAGNOSIS — R2689 Other abnormalities of gait and mobility: Secondary | ICD-10-CM | POA: Diagnosis not present

## 2023-12-10 DIAGNOSIS — M62512 Muscle wasting and atrophy, not elsewhere classified, left shoulder: Secondary | ICD-10-CM | POA: Diagnosis not present

## 2023-12-10 DIAGNOSIS — M62511 Muscle wasting and atrophy, not elsewhere classified, right shoulder: Secondary | ICD-10-CM | POA: Diagnosis not present

## 2023-12-10 DIAGNOSIS — R278 Other lack of coordination: Secondary | ICD-10-CM | POA: Diagnosis not present

## 2023-12-11 ENCOUNTER — Ambulatory Visit

## 2023-12-11 VITALS — Ht 62.0 in | Wt 143.0 lb

## 2023-12-11 DIAGNOSIS — R2689 Other abnormalities of gait and mobility: Secondary | ICD-10-CM | POA: Diagnosis not present

## 2023-12-11 DIAGNOSIS — Z0000 Encounter for general adult medical examination without abnormal findings: Secondary | ICD-10-CM | POA: Diagnosis not present

## 2023-12-11 DIAGNOSIS — R278 Other lack of coordination: Secondary | ICD-10-CM | POA: Diagnosis not present

## 2023-12-11 DIAGNOSIS — R296 Repeated falls: Secondary | ICD-10-CM | POA: Diagnosis not present

## 2023-12-11 DIAGNOSIS — M62551 Muscle wasting and atrophy, not elsewhere classified, right thigh: Secondary | ICD-10-CM | POA: Diagnosis not present

## 2023-12-11 DIAGNOSIS — M62552 Muscle wasting and atrophy, not elsewhere classified, left thigh: Secondary | ICD-10-CM | POA: Diagnosis not present

## 2023-12-11 NOTE — Progress Notes (Signed)
 Subjective:   Carol [name] is a 89 y.o. who presents for a Medicare Wellness preventive visit.  Visit Complete: Virtual I connected with  Carol [name] on 12/11/23 by a audio enabled telemedicine application and verified that I am speaking with the correct person using two identifiers.  Patient Location: Home  Provider Location: Home Office  I discussed the limitations of evaluation and management by telemedicine. The patient expressed understanding and agreed to proceed.  Vital Signs: Because this visit was a virtual/telehealth visit, some criteria may be missing or patient reported. Any vitals not documented were not able to be obtained and vitals that have been documented are patient reported.    Persons Participating in Visit: Patient assisted by Sister-in-Law.  AWV Questionnaire: No: Patient Medicare AWV questionnaire was not completed prior to this visit.  Cardiac Risk Factors include: advanced age (>88men, >61 women);hypertension     Objective:    Today's Vitals   12/11/23 1352  Weight: 143 lb (64.9 kg)  Height: 5\' 2"  (1.575 m)   Body mass index is 26.16 kg/m.     12/11/2023    2:05 PM 10/06/2023    9:00 PM 10/06/2023   12:18 PM 10/04/2022    1:57 PM 09/05/2019    2:32 AM 10/03/2016    4:30 PM  Advanced Directives  Does Patient Have a Medical Advance Directive? Yes Yes Yes Yes No Yes  Type of Estate agent of [place];Living will Healthcare Power of [place];Living will;Out of facility DNR (pink MOST or yellow form) Healthcare Power of [place];Living will;Out of facility DNR (pink MOST or yellow form) Healthcare Power of [place];Living will    Does patient want to make changes to medical advance directive? No - Patient declined No - Patient declined    No - Patient declined  Copy of Healthcare Power of Attorney in Chart? Yes - validated most recent copy scanned in chart (See row information) No - copy requested  No - copy requested    Would  patient like information on creating a medical advance directive?     No - Patient declined   Pre-existing out of facility DNR order (yellow form or pink MOST form)  Physician notified to receive inpatient order        Current Medications (verified) Outpatient Encounter Medications as of 12/11/2023  Medication Sig   acetaminophen (TYLENOL) 500 MG tablet Take 500 mg by mouth every 8 (eight) hours as needed for moderate pain (pain score 4-6) or mild pain (pain score 1-3).   Cholecalciferol (VITAMIN D3) 1000 units CAPS Take 1,000 Units by mouth daily.   cyanocobalamin 1000 MCG tablet Take 1 tablet (1,000 mcg total) by mouth daily.   donepezil (ARICEPT ODT) 5 MG disintegrating tablet DISSOLVE 1 TABLET ON THE TONGUE AT BEDTIME (Patient taking differently: Take 5 mg by mouth at bedtime.)   ELIQUIS 5 MG TABS tablet TAKE 1 TABLET TWICE DAILY   furosemide (LASIX) 20 MG tablet TAKE 1/2 TABLET EVERY DAY (Patient taking differently: Take 20 mg by mouth daily.)   metoprolol succinate (TOPROL-XL) 25 MG 24 hr tablet TAKE 1 TABLET EVERY DAY   QUEtiapine (SEROQUEL) 25 MG tablet TAKE 1 TABLET BY MOUTH AT BEDTIME. INCREASE TO TWICE DAILY AS DIRECTED BY MD (Patient taking differently: Take 75 mg by mouth daily.)   No facility-administered encounter medications on file as of 12/11/2023.    Allergies (verified) Tramadol, Sertraline, and Zoloft [sertraline hcl]   History: Past Medical History:  Diagnosis Date  Anemia    Blood transfusion without reported diagnosis    Breast cancer (HCC)    Cardiomyopathy (HCC)    Cataract    Dyslipidemia    Hyperlipidemia    Hypertension    Paroxysmal supraventricular tachycardia (HCC)    Permanent atrial fibrillation (HCC)    Stroke [facility])    Past Surgical History:  Procedure Laterality Date   MASTECTOMY     TONSILLECTOMY     WRIST SURGERY     Family History  Problem Relation Age of Onset   Epilepsy Mother    Stroke Father    Heart disease Maternal Aunt     Social History   Socioeconomic History   Marital status: Widowed    Spouse name: Not on file   Number of children: 2   Years of education: 2 years college   Highest education level: Associate degree: academic program  Occupational History   Occupation: Retired  Tobacco Use   Smoking status: Never   Smokeless tobacco: Never  Vaping Use   Vaping status: Never Used  Substance and Sexual Activity   Alcohol use: No   Drug use: No   Sexual activity: Not on file  Other Topics Concern   Not on file  Social History Narrative   Lives in a retirement community.   Right-handed.   Only drinks caffeine some days.   Social Drivers of Corporate investment banker Strain: Low Risk  (12/11/2023)   Overall Financial Resource Strain (CARDIA)    Difficulty of Paying Living Expenses: Not hard at all  Food Insecurity: No Food Insecurity (12/11/2023)   Hunger Vital Sign    Worried About Running Out of Food in the Last Year: Never true    Ran Out of Food in the Last Year: Never true  Transportation Needs: No Transportation Needs (12/11/2023)   PRAPARE - Administrator, Civil Service (Medical): No    Lack of Transportation (Non-Medical): No  Physical Activity: Sufficiently Active (12/11/2023)   Exercise Vital Sign    Days of Exercise per Week: 3 days    Minutes of Exercise per Session: 60 min  Recent Concern: Physical Activity - Inactive (09/23/2023)   Exercise Vital Sign    Days of Exercise per Week: 0 days    Minutes of Exercise per Session: 10 min  Stress: No Stress Concern Present (12/11/2023)   [employer] of Occupational Health - Occupational Stress Questionnaire    Feeling of Stress : Not at all  Social Connections: Socially Isolated (12/11/2023)   Social Connection and Isolation Panel [NHANES]    Frequency of Communication with Friends and Family: More than three times a week    Frequency of Social Gatherings with Friends and Family: More than three times a week     Attends Religious Services: Never    Database administrator or Organizations: No    Attends Banker Meetings: Never    Marital Status: Widowed    Tobacco Counseling Counseling given: Not Answered    Clinical Intake:  Pre-visit preparation completed: Yes  Pain : No/denies pain     BMI - recorded: 26.16 Nutritional Status: BMI 25 -29 Overweight Nutritional Risks: None Diabetes: No  Lab Results  Component Value Date   HGBA1C 5.6 08/19/2018     How often do you need to have someone help you when you read instructions, pamphlets, or other written materials from your doctor or pharmacy?: 1 - Never  Interpreter Needed?: No  Information  entered by :: Farris Hong LPN   Activities of Daily Living     12/11/2023    2:01 PM 10/06/2023    9:00 PM  In your present state of health, do you have any difficulty performing the following activities:  Hearing? 0 1  Vision? 0 0  Difficulty concentrating or making decisions? 0 1  Walking or climbing stairs? 1   Comment Uses a Walker   Dressing or bathing? 1   Comment Aide assist   Doing errands, shopping? 1 1  Comment Sister- Aeronautical engineer and eating ? Y   Comment Facility prepares food   Using the Toilet? N   In the past six months, have you accidently leaked urine? N   Do you have problems with loss of bowel control? N   Managing your Medications? Y   Comment Aide assist   Managing your Finances? Y   Comment Sister-in-law   Housekeeping or managing your Housekeeping? Y   Comment Aide assist     Patient Care Team: Copland, Skipper Dumas, MD as PCP - General (Family Medicine) Hugh Madura, MD as PCP - Cardiology (Cardiology)  Indicate any recent Medical Services you may have received from other than [facility] providers in the past year (date may be approximate).     Assessment:   This is a routine wellness examination for Ras[place].  Hearing/Vision screen Hearing Screening - Comments:: Denies hearing  difficulties   Vision Screening - Comments:: Wears reading glasses -Not up to date with routine eye exams. Declined referral    Goals Addressed               This Visit's Progress     No current goals (pt-stated)         Depression Screen     12/11/2023    2:00 PM 10/04/2022    1:55 PM 12/12/2021    1:02 PM 12/08/2020    1:04 PM 09/18/2019   11:04 AM 11/07/2017    2:58 PM  PHQ 2/9 Scores  PHQ - 2 Score 0 0 0 0 0 0    Fall Risk     12/11/2023    2:04 PM 10/04/2022    1:58 PM 12/12/2021    1:02 PM 12/08/2020    1:02 PM 09/18/2019   11:04 AM  Fall Risk   Falls in the past year? 1 0 0 1 1  Number falls in past yr: 1 0 0 0 0  Injury with Fall? 0 0 0 0 0  Comment Followed by medical attention      Risk for fall due to : History of fall(s) Impaired vision     Follow up Falls prevention discussed;Falls evaluation completed Falls prevention discussed   Falls evaluation completed    MEDICARE RISK AT HOME:  Medicare Risk at Home Any stairs in or around the home?: No If so, are there any without handrails?: No Home free of loose throw rugs in walkways, pet beds, electrical cords, etc?: Yes Adequate lighting in your home to reduce risk of falls?: Yes Life alert?: Yes Use of a cane, walker or w/c?: Yes Grab bars in the bathroom?: Yes Shower chair or bench in shower?: Yes Elevated toilet seat or a handicapped toilet?: Yes  TIMED UP AND GO:  Was the test performed?  No  Cognitive Function: Impaired: Cognitive status assessed by direct observation. Patient is unable to complete screening 6CIT or other cognitive screening test.      09/11/2018  11:07 AM  MMSE - Mini Mental State Exam  Orientation to time 4  Orientation to Place 5  Registration 3  Attention/ Calculation 5  Recall 2  Language- name 2 objects 2  Language- repeat 1  Language- follow 3 step command 3  Language- read & follow direction 1  Write a sentence 1  Copy design 1  Total score 28        12/11/2023     2:06 PM 10/04/2022    2:00 PM  6CIT Screen  What Year? -- 4 points  What month? -- 0 points  What time? -- 0 points  Count back from 20 -- 0 points  Months in reverse -- 4 points  Repeat phrase -- 10 points  Total Score  18 points    Immunizations Immunization History  Administered Date(s) Administered   Fluad Quad(high Dose 65+) 06/10/2020, 06/13/2021   Influenza Split 08/08/2018, 05/13/2019, 05/10/2022   Influenza, High Dose Seasonal PF 11/07/2017, 08/08/2018, 05/13/2019, 06/10/2020   Influenza-Unspecified 08/08/2018, 05/13/2019, 05/04/2023   Moderna Sars-Covid-2 Vaccination 10/22/2019, 11/19/2019, 07/20/2020   Pneumococcal Conjugate-13 11/02/2016   Pneumococcal Polysaccharide-23 12/12/2021   Td 12/08/2020   Zoster Recombinant(Shingrix) 01/25/2022    Screening Tests Health Maintenance  Topic Date Due   Zoster Vaccines- Shingrix (2 of 2) 03/22/2022   COVID-19 Vaccine (4 - 2024-25 season) 04/29/2023   INFLUENZA VACCINE  03/28/2024   Medicare Annual Wellness (AWV)  12/10/2024   DTaP/Tdap/Td (2 - Tdap) 12/09/2030   Pneumonia Vaccine 53+ Years old  Completed   DEXA SCAN  Completed   HPV VACCINES  Aged Out   Meningococcal B Vaccine  Aged Out    Health Maintenance  Health Maintenance Due  Topic Date Due   Zoster Vaccines- Shingrix (2 of 2) 03/22/2022   COVID-19 Vaccine (4 - 2024-25 season) 04/29/2023   Health Maintenance Items Addressed:    Additional Screening:  Vision Screening: Recommended annual ophthalmology exams for early detection of glaucoma and other disorders of the eye.  Dental Screening: Recommended annual dental exams for proper oral hygiene  Community Resource Referral / Chronic Care Management: CRR required this visit?  No   CCM required this visit?  No     Plan:     I have personally reviewed and noted the following in the patient's chart:   Medical and social history Use of alcohol, tobacco or illicit drugs  Current medications and  supplements including opioid prescriptions. Patient is not currently taking opioid prescriptions. Functional ability and status Nutritional status Physical activity Advanced directives List of other physicians Hospitalizations, surgeries, and ER visits in previous 12 months Vitals Screenings to include cognitive, depression, and falls Referrals and appointments  In addition, I have reviewed and discussed with patient certain preventive protocols, quality metrics, and best practice recommendations. A written personalized care plan for preventive services as well as general preventive health recommendations were provided to patient.     Dewayne Ford, LPN   1/61/0960   After Visit Summary: (MyChart) Due to this being a telephonic visit, the after visit summary with patients personalized plan was offered to patient via MyChart   Notes: Nothing significant to report at this time.

## 2023-12-11 NOTE — Patient Instructions (Addendum)
 Carol [name] , Thank you for taking time to come for your Medicare Wellness Visit. I appreciate your ongoing commitment to your health goals. Please review the following plan we discussed and let me know if I can assist you in the future.   Referrals/Orders/Follow-Ups/Clinician Recommendations:   This is a list of the screening recommended for you and due dates:  Health Maintenance  Topic Date Due   Zoster (Shingles) Vaccine (2 of 2) 03/22/2022   COVID-19 Vaccine (4 - 2024-25 season) 04/29/2023   Flu Shot  03/28/2024   Medicare Annual Wellness Visit  12/10/2024   DTaP/Tdap/Td vaccine (2 - Tdap) 12/09/2030   Pneumonia Vaccine  Completed   DEXA scan (bone density measurement)  Completed   HPV Vaccine  Aged Out   Meningitis B Vaccine  Aged Out    Advanced directives: (In Chart) A copy of your advanced directives are scanned into your chart should your provider ever need it.  Next Medicare Annual Wellness Visit scheduled for next year: Yes

## 2023-12-12 DIAGNOSIS — R488 Other symbolic dysfunctions: Secondary | ICD-10-CM | POA: Diagnosis not present

## 2023-12-12 DIAGNOSIS — R4789 Other speech disturbances: Secondary | ICD-10-CM | POA: Diagnosis not present

## 2023-12-13 ENCOUNTER — Ambulatory Visit

## 2023-12-13 DIAGNOSIS — R296 Repeated falls: Secondary | ICD-10-CM | POA: Diagnosis not present

## 2023-12-13 DIAGNOSIS — M62552 Muscle wasting and atrophy, not elsewhere classified, left thigh: Secondary | ICD-10-CM | POA: Diagnosis not present

## 2023-12-13 DIAGNOSIS — M62551 Muscle wasting and atrophy, not elsewhere classified, right thigh: Secondary | ICD-10-CM | POA: Diagnosis not present

## 2023-12-13 DIAGNOSIS — R2689 Other abnormalities of gait and mobility: Secondary | ICD-10-CM | POA: Diagnosis not present

## 2023-12-13 DIAGNOSIS — M62511 Muscle wasting and atrophy, not elsewhere classified, right shoulder: Secondary | ICD-10-CM | POA: Diagnosis not present

## 2023-12-13 DIAGNOSIS — M62512 Muscle wasting and atrophy, not elsewhere classified, left shoulder: Secondary | ICD-10-CM | POA: Diagnosis not present

## 2023-12-13 DIAGNOSIS — R278 Other lack of coordination: Secondary | ICD-10-CM | POA: Diagnosis not present

## 2023-12-14 DIAGNOSIS — R4789 Other speech disturbances: Secondary | ICD-10-CM | POA: Diagnosis not present

## 2023-12-14 DIAGNOSIS — R488 Other symbolic dysfunctions: Secondary | ICD-10-CM | POA: Diagnosis not present

## 2023-12-17 DIAGNOSIS — R488 Other symbolic dysfunctions: Secondary | ICD-10-CM | POA: Diagnosis not present

## 2023-12-17 DIAGNOSIS — R296 Repeated falls: Secondary | ICD-10-CM | POA: Diagnosis not present

## 2023-12-17 DIAGNOSIS — M62511 Muscle wasting and atrophy, not elsewhere classified, right shoulder: Secondary | ICD-10-CM | POA: Diagnosis not present

## 2023-12-17 DIAGNOSIS — M62512 Muscle wasting and atrophy, not elsewhere classified, left shoulder: Secondary | ICD-10-CM | POA: Diagnosis not present

## 2023-12-17 DIAGNOSIS — R4789 Other speech disturbances: Secondary | ICD-10-CM | POA: Diagnosis not present

## 2023-12-17 DIAGNOSIS — R2689 Other abnormalities of gait and mobility: Secondary | ICD-10-CM | POA: Diagnosis not present

## 2023-12-17 DIAGNOSIS — R278 Other lack of coordination: Secondary | ICD-10-CM | POA: Diagnosis not present

## 2023-12-18 DIAGNOSIS — M62552 Muscle wasting and atrophy, not elsewhere classified, left thigh: Secondary | ICD-10-CM | POA: Diagnosis not present

## 2023-12-18 DIAGNOSIS — R296 Repeated falls: Secondary | ICD-10-CM | POA: Diagnosis not present

## 2023-12-18 DIAGNOSIS — R278 Other lack of coordination: Secondary | ICD-10-CM | POA: Diagnosis not present

## 2023-12-18 DIAGNOSIS — M62551 Muscle wasting and atrophy, not elsewhere classified, right thigh: Secondary | ICD-10-CM | POA: Diagnosis not present

## 2023-12-18 DIAGNOSIS — R2689 Other abnormalities of gait and mobility: Secondary | ICD-10-CM | POA: Diagnosis not present

## 2023-12-19 DIAGNOSIS — R2689 Other abnormalities of gait and mobility: Secondary | ICD-10-CM | POA: Diagnosis not present

## 2023-12-19 DIAGNOSIS — R4789 Other speech disturbances: Secondary | ICD-10-CM | POA: Diagnosis not present

## 2023-12-19 DIAGNOSIS — R296 Repeated falls: Secondary | ICD-10-CM | POA: Diagnosis not present

## 2023-12-19 DIAGNOSIS — M62511 Muscle wasting and atrophy, not elsewhere classified, right shoulder: Secondary | ICD-10-CM | POA: Diagnosis not present

## 2023-12-19 DIAGNOSIS — R488 Other symbolic dysfunctions: Secondary | ICD-10-CM | POA: Diagnosis not present

## 2023-12-19 DIAGNOSIS — R278 Other lack of coordination: Secondary | ICD-10-CM | POA: Diagnosis not present

## 2023-12-19 DIAGNOSIS — M62512 Muscle wasting and atrophy, not elsewhere classified, left shoulder: Secondary | ICD-10-CM | POA: Diagnosis not present

## 2023-12-20 DIAGNOSIS — R296 Repeated falls: Secondary | ICD-10-CM | POA: Diagnosis not present

## 2023-12-20 DIAGNOSIS — M62551 Muscle wasting and atrophy, not elsewhere classified, right thigh: Secondary | ICD-10-CM | POA: Diagnosis not present

## 2023-12-20 DIAGNOSIS — M62552 Muscle wasting and atrophy, not elsewhere classified, left thigh: Secondary | ICD-10-CM | POA: Diagnosis not present

## 2023-12-20 DIAGNOSIS — R278 Other lack of coordination: Secondary | ICD-10-CM | POA: Diagnosis not present

## 2023-12-20 DIAGNOSIS — R2689 Other abnormalities of gait and mobility: Secondary | ICD-10-CM | POA: Diagnosis not present

## 2023-12-24 DIAGNOSIS — R488 Other symbolic dysfunctions: Secondary | ICD-10-CM | POA: Diagnosis not present

## 2023-12-24 DIAGNOSIS — R4789 Other speech disturbances: Secondary | ICD-10-CM | POA: Diagnosis not present

## 2023-12-26 DIAGNOSIS — R488 Other symbolic dysfunctions: Secondary | ICD-10-CM | POA: Diagnosis not present

## 2023-12-26 DIAGNOSIS — R4789 Other speech disturbances: Secondary | ICD-10-CM | POA: Diagnosis not present

## 2024-01-23 ENCOUNTER — Other Ambulatory Visit: Payer: Self-pay | Admitting: Family Medicine

## 2024-02-13 ENCOUNTER — Ambulatory Visit: Payer: Medicare HMO | Admitting: Family Medicine

## 2024-02-18 ENCOUNTER — Ambulatory Visit: Admitting: Family Medicine

## 2024-02-23 NOTE — Progress Notes (Unsigned)
 [facility] [address] [place], [place] [address] [phone]  Date:  02/25/2024   Name:  Carol [name]   DOB:  Oct 08, 1927   MRN:  [id number]  PCP:  Watt Harlene BROCKS, MD    Chief Complaint: No chief complaint on file.   History of Present Illness:  Carol [name] is a 89 y.o. very pleasant female patient who presents with the following:  Pt seen today for a periodic recheck- history of a fib on eliquis , hyperlipidemia, HTN, mild dementia, breast cancer, prior stroke, vitamin D  deficiency, pulmonary edema/fluid overload  Last seen by myself in February - she had recently fallen and sustained a sacral fracture but was back at her assisted living community in her apt   We have been dealing with some left arm swelling  Patient Active Problem List   Diagnosis Date Noted   Acute cystitis without hematuria 10/07/2023   Generalized weakness 10/07/2023   Nausea & vomiting 10/06/2023   Fall at home, initial encounter 10/06/2023   Pressure injury of skin 10/06/2023   Paroxysmal atrial fibrillation (HCC) 08/13/2018   Right knee pain 11/16/2017   Osteopenia 11/12/2016   Mild cognitive disorder 10/10/2016   Chronic anticoagulation 10/04/2016   History of atrial fibrillation 10/04/2016   Essential hypertension, benign 09/01/2016   Paroxysmal supraventricular tachycardia (HCC) 09/01/2016   Hyperlipidemia 09/01/2016    Past Medical History:  Diagnosis Date   Anemia    Blood transfusion without reported diagnosis    Breast cancer (HCC)    Cardiomyopathy (HCC)    Cataract    Dyslipidemia    Hyperlipidemia    Hypertension    Paroxysmal supraventricular tachycardia (HCC)    Permanent atrial fibrillation (HCC)    Stroke (HCC)     Past Surgical History:  Procedure Laterality Date   MASTECTOMY     TONSILLECTOMY     WRIST SURGERY      Social History   Tobacco Use   Smoking status: Never   Smokeless  tobacco: Never  Vaping Use   Vaping status: Never Used  Substance Use Topics   Alcohol use: No   Drug use: No    Family History  Problem Relation Age of Onset   Epilepsy Mother    Stroke Father    Heart disease Maternal Aunt     Allergies  Allergen Reactions   Tramadol Other (See Comments)    seizure   Sertraline Other (See Comments)   Zoloft [Sertraline Hcl]     headache    Medication list has been reviewed and updated.  Current Outpatient Medications on File Prior to Visit  Medication Sig Dispense Refill   acetaminophen  (TYLENOL ) 500 MG tablet Take 500 mg by mouth every 8 (eight) hours as needed for moderate pain (pain score 4-6) or mild pain (pain score 1-3).     Cholecalciferol  (VITAMIN D3) 1000 units CAPS Take 1,000 Units by mouth daily.     cyanocobalamin  1000 MCG tablet Take 1 tablet (1,000 mcg total) by mouth daily. 60 tablet 0   donepezil  (ARICEPT  ODT) 5 MG disintegrating tablet Take 1 tablet (5 mg total) by mouth at bedtime. 90 tablet 0   ELIQUIS  5 MG TABS tablet TAKE 1 TABLET TWICE DAILY 180 tablet 3   furosemide  (LASIX ) 20 MG tablet TAKE 1/2 TABLET EVERY DAY (Patient taking differently: Take 20 mg by mouth daily.) 45 tablet 3   metoprolol  succinate (TOPROL -XL) 25 MG  24 hr tablet TAKE 1 TABLET EVERY DAY 90 tablet 2   QUEtiapine  (SEROQUEL ) 25 MG tablet TAKE 1 TABLET BY MOUTH AT BEDTIME. INCREASE TO TWICE DAILY AS DIRECTED BY MD (Patient taking differently: Take 75 mg by mouth daily.) 180 tablet 3   No current facility-administered medications on file prior to visit.    Review of Systems:  As per HPI- otherwise negative.   Physical Examination: There were no vitals filed for this visit. There were no vitals filed for this visit. There is no height or weight on file to calculate BMI. Ideal Body Weight:    GEN: no acute distress. HEENT: Atraumatic, Normocephalic.  Ears and Nose: No external deformity. CV: RRR, No M/G/R. No JVD. No thrill. No extra heart  sounds. PULM: CTA B, no wheezes, crackles, rhonchi. No retractions. No resp. distress. No accessory muscle use. ABD: S, NT, ND, +BS. No rebound. No HSM. EXTR: No c/c/e PSYCH: Normally interactive. Conversant.    Assessment and Plan: ***  Signed Harlene Schroeder, MD

## 2024-02-23 NOTE — Patient Instructions (Incomplete)
 Good to see you again today Please see me in about 4- 6 months for a recheck  I will be in touch with your labs

## 2024-02-25 ENCOUNTER — Encounter: Payer: Self-pay | Admitting: Family Medicine

## 2024-02-25 ENCOUNTER — Ambulatory Visit (INDEPENDENT_AMBULATORY_CARE_PROVIDER_SITE_OTHER): Admitting: Family Medicine

## 2024-02-25 VITALS — BP 126/64 | HR 70 | Ht 62.0 in | Wt 134.0 lb

## 2024-02-25 DIAGNOSIS — E538 Deficiency of other specified B group vitamins: Secondary | ICD-10-CM

## 2024-02-25 DIAGNOSIS — M7989 Other specified soft tissue disorders: Secondary | ICD-10-CM

## 2024-02-25 DIAGNOSIS — D649 Anemia, unspecified: Secondary | ICD-10-CM

## 2024-02-26 ENCOUNTER — Encounter: Payer: Self-pay | Admitting: Family Medicine

## 2024-02-26 LAB — BASIC METABOLIC PANEL WITH GFR
BUN: 13 mg/dL (ref 6–23)
CO2: 31 meq/L (ref 19–32)
Calcium: 10.1 mg/dL (ref 8.4–10.5)
Chloride: 99 meq/L (ref 96–112)
Creatinine, Ser: 0.99 mg/dL (ref 0.40–1.20)
GFR: 49.09 mL/min — ABNORMAL LOW (ref >60.00)
Glucose, Bld: 95 mg/dL (ref 70–99)
Potassium: 4.5 meq/L (ref 3.5–5.1)
Sodium: 140 meq/L (ref 135–145)

## 2024-02-26 LAB — CBC
HCT: 40.7 % (ref 36.0–46.0)
Hemoglobin: 13.5 g/dL (ref 12.0–15.0)
MCHC: 33.1 g/dL (ref 30.0–36.0)
MCV: 89.9 fl (ref 78.0–100.0)
Platelets: 215 10*3/uL (ref 150.0–400.0)
RBC: 4.52 Mil/uL (ref 3.87–5.11)
RDW: 16 % — ABNORMAL HIGH (ref 11.5–15.5)
WBC: 7 10*3/uL (ref 4.0–10.5)

## 2024-02-26 LAB — VITAMIN B12: Vitamin B-12: 1250 pg/mL — ABNORMAL HIGH (ref 211–911)

## 2024-03-01 ENCOUNTER — Other Ambulatory Visit: Payer: Self-pay | Admitting: Family Medicine

## 2024-03-01 DIAGNOSIS — R410 Disorientation, unspecified: Secondary | ICD-10-CM

## 2024-04-06 ENCOUNTER — Other Ambulatory Visit: Payer: Self-pay | Admitting: Family Medicine

## 2024-04-11 ENCOUNTER — Other Ambulatory Visit: Payer: Self-pay

## 2024-04-11 DIAGNOSIS — I4891 Unspecified atrial fibrillation: Secondary | ICD-10-CM

## 2024-04-11 MED ORDER — APIXABAN 5 MG PO TABS: 5.0000 mg | ORAL_TABLET | Freq: Two times a day (BID) | ORAL | 3 refills | Status: AC

## 2024-04-11 NOTE — Telephone Encounter (Signed)
 Prescription refill request for Eliquis received. Indication:AFIB Last office visit:11/24 Scr:0.99  6/25 Age: 89 Weight:60.8  kg  Prescription refilled

## 2024-06-22 ENCOUNTER — Other Ambulatory Visit: Payer: Self-pay | Admitting: Family Medicine

## 2024-08-29 NOTE — Progress Notes (Signed)
 [facility] [address] [place], [place] [address] [phone]  Date:  09/01/2024   Name:  Carol [name]   DOB:  [birth year]   MRN:  [id number]  PCP:  Watt Harlene BROCKS, MD    Chief Complaint: Follow-up (Sore throat, fatigue and just not feeling well )   History of Present Illness:  Carol [name] is a 89 y.o. very pleasant female patient who presents with the following:  Pt seen today for periodic recheck-however, turned that she is not feeling well today. Last seen by me in June  -history of a fib on eliquis , hyperlipidemia, HTN, mild dementia, breast cancer, prior stroke, vitamin D  deficiency, pulmonary edema/fluid overload  She has been dealing with some left arm swelling- was improved at our last visit 6 months ago   Shingrix Flu shot- done this fall  Covid booster Labs done in June   Discussed the use of AI scribe software for clinical note transcription with the patient, who gave verbal consent to proceed.  History of Present Illness Carol [name] is a 89 year old female who presents with symptoms of a cold. She is accompanied by her brother.  He had not realize she was not feeling great until he picked her up for this visit.  She lives at an assisted living facility  She has been feeling unwell for the past couple of days, experiencing a sore throat and a stuffy nose. No fever, vomiting, or diarrhea. Her brother reported that she did not have a fever when he checked her temperature this morning.  Patient reports she does not feel as she had the flu as she has not been sick enough  She received her flu shot in the fall and has not been feeling significantly unwell in the past few months. However, she notes her energy level has been 'a little bit below normal.'    Patient Active Problem List   Diagnosis Date Noted   Acute cystitis without hematuria 10/07/2023   Generalized weakness 10/07/2023    Nausea & vomiting 10/06/2023   Fall at home, initial encounter 10/06/2023   Pressure injury of skin 10/06/2023   Paroxysmal atrial fibrillation (HCC) 08/13/2018   Right knee pain 11/16/2017   Osteopenia 11/12/2016   Mild cognitive disorder 10/10/2016   Chronic anticoagulation 10/04/2016   History of atrial fibrillation 10/04/2016   Essential hypertension, benign 09/01/2016   Paroxysmal supraventricular tachycardia 09/01/2016   Hyperlipidemia 09/01/2016    Past Medical History:  Diagnosis Date   Anemia    Blood transfusion without reported diagnosis    Breast cancer (HCC)    Cardiomyopathy (HCC)    Cataract    Dyslipidemia    Hyperlipidemia    Hypertension    Paroxysmal supraventricular tachycardia    Permanent atrial fibrillation (HCC)    Stroke [facility])     Past Surgical History:  Procedure Laterality Date   MASTECTOMY     TONSILLECTOMY     WRIST SURGERY      Social History[1]  Family History  Problem Relation Age of Onset   Epilepsy Mother    Stroke Father    Heart disease Maternal Aunt     Allergies[2]  Medication list has been reviewed and updated.  Medications Ordered Prior to Encounter[3]  Review of Systems:  As per HPI- otherwise negative.   Physical Examination: Vitals:   09/01/24 1401  BP: 130/82  Pulse: 89  Temp:  98.2 F (36.8 C)  SpO2: 97%   Vitals:   09/01/24 1401  Weight: 130 lb 6.4 oz (59.1 kg)  Height: 5' 2 (1.575 m)   Body mass index is 23.85 kg/m. Ideal Body Weight: Weight in (lb) to have BMI = 25: 136.4  GEN: no acute distress.  Looks well in her normal self, very spry for age HEENT: Atraumatic, Normocephalic.  Ears and Nose: No external deformity. CV: RRR, No M/G/R. No JVD. No thrill. No extra heart sounds. PULM: CTA B, no wheezes, crackles, rhonchi. No retractions. No resp. distress. No accessory muscle use. ABD: S, NT, ND, +BS. No rebound. No HSM. EXTR: No c/c/e PSYCH: Normally interactive. Conversant.   We were out  of COVID-19 tests in my office.  Patient's brother purchased a over-the-counter COVID-19 test which I performed for them, it is negative Assessment and Plan: Acute cough - Plan: CBC, Comprehensive metabolic panel with GFR, doxycycline  (VIBRAMYCIN ) 100 MG capsule  Assessment & Plan Acute upper respiratory infection Symptoms suggest viral etiology. COVID-19 considered but flu unlikely. Antibiotics contingent on negative COVID-19 test. - Ordered blood work. - Prescribed antibiotics contingent on negative COVID-19 test-negative test, prescribed doxycycline  They will let me know if she is not feeling better over the next several days.  Routine blood work is pending today  General health maintenance Received flu shot in the fall.  Signed Harlene Schroeder, MD     [1]  Social History Tobacco Use   Smoking status: Never   Smokeless tobacco: Never  Vaping Use   Vaping status: Never Used  Substance Use Topics   Alcohol use: No   Drug use: No  [2]  Allergies Allergen Reactions   Tramadol Other (See Comments)    seizure   Sertraline Other (See Comments)   Zoloft [Sertraline Hcl]     headache  [3]  Current Outpatient Medications on File Prior to Visit  Medication Sig Dispense Refill   acetaminophen  (TYLENOL ) 500 MG tablet Take 500 mg by mouth every 8 (eight) hours as needed for moderate pain (pain score 4-6) or mild pain (pain score 1-3).     apixaban  (ELIQUIS ) 5 MG TABS tablet Take 1 tablet (5 mg total) by mouth 2 (two) times daily. 180 tablet 3   Cholecalciferol  (VITAMIN D3) 1000 units CAPS Take 1,000 Units by mouth daily.     cyanocobalamin  (VITAMIN B12) 1000 MCG tablet Take 1 tablet (1,000 mcg total) by mouth daily.     cyanocobalamin  1000 MCG tablet Take 1 tablet (1,000 mcg total) by mouth daily. 60 tablet 0   donepezil  (ARICEPT  ODT) 5 MG disintegrating tablet DISSOLVE 1 TABLET ON THE TONGUE AT BEDTIME 90 tablet 3   furosemide  (LASIX ) 20 MG tablet TAKE 1/2 TABLET EVERY DAY 45  tablet 3   metoprolol  succinate (TOPROL -XL) 25 MG 24 hr tablet TAKE 1 TABLET EVERY DAY 90 tablet 2   QUEtiapine  (SEROQUEL ) 25 MG tablet Take 1-2 tablets (25-50 mg total) by mouth at bedtime. 180 tablet 1   No current facility-administered medications on file prior to visit.   "

## 2024-09-01 ENCOUNTER — Ambulatory Visit (INDEPENDENT_AMBULATORY_CARE_PROVIDER_SITE_OTHER): Admitting: Family Medicine

## 2024-09-01 ENCOUNTER — Encounter: Payer: Self-pay | Admitting: Family Medicine

## 2024-09-01 ENCOUNTER — Other Ambulatory Visit (HOSPITAL_BASED_OUTPATIENT_CLINIC_OR_DEPARTMENT_OTHER): Payer: Self-pay

## 2024-09-01 VITALS — BP 130/82 | HR 89 | Temp 98.2°F | Ht 62.0 in | Wt 130.4 lb

## 2024-09-01 DIAGNOSIS — R051 Acute cough: Secondary | ICD-10-CM | POA: Diagnosis not present

## 2024-09-01 MED ORDER — DOXYCYCLINE HYCLATE 100 MG PO CAPS
100.0000 mg | ORAL_CAPSULE | Freq: Two times a day (BID) | ORAL | 0 refills | Status: AC
  Filled 2024-09-01: qty 20, 10d supply, fill #0

## 2024-09-01 NOTE — Patient Instructions (Addendum)
 Good to see you today!   I will be in touch with your labs Use the doxycycline  for your current upper respiratory symptoms- let me know if you are not feeling better in the next few days!  If all is well please see me in about 6 months   Recommend the shingles vaccine series if not done already

## 2024-09-02 ENCOUNTER — Encounter: Payer: Self-pay | Admitting: Family Medicine

## 2024-09-02 LAB — COMPREHENSIVE METABOLIC PANEL WITH GFR
ALT: 9 U/L (ref 3–35)
AST: 18 U/L (ref 5–37)
Albumin: 4.1 g/dL (ref 3.5–5.2)
Alkaline Phosphatase: 50 U/L (ref 39–117)
BUN: 24 mg/dL — ABNORMAL HIGH (ref 6–23)
CO2: 29 meq/L (ref 19–32)
Calcium: 9.5 mg/dL (ref 8.4–10.5)
Chloride: 97 meq/L (ref 96–112)
Creatinine, Ser: 0.85 mg/dL (ref 0.40–1.20)
GFR: 58.74 mL/min — ABNORMAL LOW
Glucose, Bld: 87 mg/dL (ref 70–99)
Potassium: 4 meq/L (ref 3.5–5.1)
Sodium: 136 meq/L (ref 135–145)
Total Bilirubin: 1.2 mg/dL (ref 0.2–1.2)
Total Protein: 7.1 g/dL (ref 6.0–8.3)

## 2024-09-02 LAB — CBC
HCT: 39.6 % (ref 36.0–46.0)
Hemoglobin: 13.3 g/dL (ref 12.0–15.0)
MCHC: 33.7 g/dL (ref 30.0–36.0)
MCV: 90.9 fl (ref 78.0–100.0)
Platelets: 200 K/uL (ref 150.0–400.0)
RBC: 4.36 Mil/uL (ref 3.87–5.11)
RDW: 14.5 % (ref 11.5–15.5)
WBC: 9.9 K/uL (ref 4.0–10.5)

## 2024-09-30 ENCOUNTER — Other Ambulatory Visit: Payer: Self-pay | Admitting: Family Medicine

## 2024-09-30 DIAGNOSIS — R410 Disorientation, unspecified: Secondary | ICD-10-CM

## 2024-12-16 ENCOUNTER — Ambulatory Visit
